# Patient Record
Sex: Male | Born: 1940
Health system: Southern US, Community
[De-identification: ages and names within clinical notes are randomized; demographics above are authoritative.]

## PROBLEM LIST (undated history)

## (undated) DIAGNOSIS — Z940 Kidney transplant status: Secondary | ICD-10-CM

## (undated) DIAGNOSIS — S0591XA Unspecified injury of right eye and orbit, initial encounter: Secondary | ICD-10-CM

## (undated) DIAGNOSIS — I10 Essential (primary) hypertension: Secondary | ICD-10-CM

## (undated) DIAGNOSIS — N189 Chronic kidney disease, unspecified: Secondary | ICD-10-CM

## (undated) DIAGNOSIS — K219 Gastro-esophageal reflux disease without esophagitis: Secondary | ICD-10-CM

## (undated) DIAGNOSIS — M109 Gout, unspecified: Secondary | ICD-10-CM

## (undated) HISTORY — PX: CHOLECYSTECTOMY: SHX55

## (undated) HISTORY — PX: KIDNEY TRANSPLANT: SHX239

---

## 2011-10-05 ENCOUNTER — Emergency Department (HOSPITAL_BASED_OUTPATIENT_CLINIC_OR_DEPARTMENT_OTHER)
Admission: EM | Admit: 2011-10-05 | Discharge: 2011-10-05 | Disposition: A | Discharge status: Home / Self Care | Payer: Medicare Other | Attending: Emergency Medicine | Admitting: Emergency Medicine

## 2011-10-05 ENCOUNTER — Encounter (HOSPITAL_BASED_OUTPATIENT_CLINIC_OR_DEPARTMENT_OTHER): Payer: Self-pay | Admitting: *Deleted

## 2011-10-05 DIAGNOSIS — L97209 Non-pressure chronic ulcer of unspecified calf with unspecified severity: Secondary | ICD-10-CM | POA: Insufficient documentation

## 2011-10-05 DIAGNOSIS — L97909 Non-pressure chronic ulcer of unspecified part of unspecified lower leg with unspecified severity: Secondary | ICD-10-CM

## 2011-10-05 DIAGNOSIS — Z940 Kidney transplant status: Secondary | ICD-10-CM | POA: Insufficient documentation

## 2011-10-05 HISTORY — DX: Kidney transplant status: Z94.0

## 2011-10-05 MED ORDER — TRAMADOL HCL 50 MG PO TABS: 50.0000 mg | ORAL_TABLET | Freq: Four times a day (QID) | ORAL | Status: AC | PRN

## 2011-10-05 NOTE — ED Provider Notes (Signed)
History     CSN: [id number]  Arrival date & time 10/05/11  0900   None     Chief Complaint  Patient presents with  . Wound Infection     HPI 3 month history of "sore " on left lower leg. States he feels like " a bunch of bees stinging him" on wound area.  Patient has an appointment with the wound center in the next 10 days.  Presents today because is having nighttime pain in the area which is interrupting his sleep.  Patient denies any fever or red streaks.  Patient was treating with Neosporin once a day but currently is not treating her taking care of the wound any particular way.  The wounds being left open to air.  Past Medical History  Diagnosis Date  . H/O kidney transplant     Past Surgical History  Procedure Date  . Kidney transplant     History reviewed. No pertinent family history.  History  Substance Use Topics  . Smoking status: Never Smoker   . Smokeless tobacco: Not on file  . Alcohol Use: No      Review of Systems  All other systems reviewed and are negative.    Allergies  Review of patient's allergies indicates no known allergies.  Home Medications   Current Outpatient Rx  Name Route Sig Dispense Refill  . ACETAMINOPHEN 650 MG RE SUPP Rectal Place 650 mg rectally every 4 (four) hours as needed.    . ALLOPURINOL 100 MG PO TABS Oral Take 100 mg by mouth daily.    . ASPIRIN 81 MG PO CHEW Oral Chew 81 mg by mouth daily.    Marland Kitchen MAGNESIUM CHLORIDE 64 MG PO TBEC Oral Take by mouth.    Marland Kitchen MYCOPHENOLATE SODIUM 360 MG PO TBEC Oral Take 360 mg by mouth 2 (two) times daily.    Marland Kitchen RANITIDINE HCL 150 MG PO CAPS Oral Take 150 mg by mouth 2 (two) times daily.    Marland Kitchen SIMVASTATIN 20 MG PO TABS Oral Take 20 mg by mouth every evening.    Marland Kitchen TACROLIMUS 1 MG PO CAPS Oral Take 1 mg by mouth 2 (two) times daily.    . TRAMADOL HCL 50 MG PO TABS Oral Take 1 tablet (50 mg total) by mouth every 6 (six) hours as needed for pain. 30 tablet 0    BP 132/86  Pulse 97  Temp 97.4  F (36.3 C) (Oral)  Resp 14  Ht 5\' 6"  (1.676 m)  Wt 215 lb (97.523 kg)  BMI 34.70 kg/m2  SpO2 99%  Physical Exam  Nursing note and vitals reviewed. Constitutional: He is oriented to person, place, and time. He appears well-developed and well-nourished. No distress.  HENT:  Head: Normocephalic and atraumatic.  Eyes: Pupils are equal, round, and reactive to light.  Neck: Normal range of motion.  Cardiovascular: Normal rate and intact distal pulses.   Pulmonary/Chest: No respiratory distress.  Abdominal: Normal appearance. He exhibits no distension.  Musculoskeletal: Normal range of motion.       Legs: Neurological: He is alert and oriented to person, place, and time. No cranial nerve deficit.  Skin: Skin is warm and dry. No rash noted.  Psychiatric: He has a normal mood and affect. His behavior is normal.    ED Course  Procedures (including critical care time)  Labs Reviewed - No data to display No results found.   1. Leg ulcer       MDM  Patient currently  has an appointment upcoming next week with wound care center.  He is encouraged keep that appointment.  He is instructed to return to emergency department should he develop red streaks or fever/chills.      myof  Dot Lanes, MD 10/06/11 [phone]

## 2011-10-05 NOTE — ED Notes (Signed)
3 month history of "sore " on left lower leg. States he feels like " a bunch of bees stinging him" on wound area.

## 2012-12-22 ENCOUNTER — Ambulatory Visit: Payer: Medicare Other | Attending: Internal Medicine | Admitting: Rehabilitation

## 2012-12-22 DIAGNOSIS — M545 Low back pain, unspecified: Secondary | ICD-10-CM | POA: Insufficient documentation

## 2012-12-22 DIAGNOSIS — IMO0001 Reserved for inherently not codable concepts without codable children: Secondary | ICD-10-CM | POA: Insufficient documentation

## 2012-12-24 ENCOUNTER — Ambulatory Visit: Payer: Medicare Other | Admitting: Rehabilitation

## 2012-12-30 ENCOUNTER — Ambulatory Visit: Payer: Medicare Other | Admitting: Rehabilitation

## 2013-01-06 ENCOUNTER — Ambulatory Visit: Payer: Medicare Other | Attending: Internal Medicine | Admitting: Rehabilitation

## 2013-01-06 DIAGNOSIS — IMO0001 Reserved for inherently not codable concepts without codable children: Secondary | ICD-10-CM | POA: Insufficient documentation

## 2013-01-06 DIAGNOSIS — M545 Low back pain, unspecified: Secondary | ICD-10-CM | POA: Insufficient documentation

## 2013-05-18 ENCOUNTER — Inpatient Hospital Stay (HOSPITAL_BASED_OUTPATIENT_CLINIC_OR_DEPARTMENT_OTHER)
Admission: EM | Admit: 2013-05-18 | Discharge: 2013-05-21 | DRG: 124 | Disposition: A | Discharge status: Home / Self Care | Payer: Medicare Other | Attending: General Surgery | Admitting: General Surgery

## 2013-05-18 ENCOUNTER — Encounter (HOSPITAL_BASED_OUTPATIENT_CLINIC_OR_DEPARTMENT_OTHER): Payer: Self-pay | Admitting: Emergency Medicine

## 2013-05-18 ENCOUNTER — Emergency Department (HOSPITAL_BASED_OUTPATIENT_CLINIC_OR_DEPARTMENT_OTHER): Payer: Medicare Other

## 2013-05-18 DIAGNOSIS — H05231 Hemorrhage of right orbit: Secondary | ICD-10-CM

## 2013-05-18 DIAGNOSIS — F1010 Alcohol abuse, uncomplicated: Secondary | ICD-10-CM | POA: Diagnosis present

## 2013-05-18 DIAGNOSIS — Z940 Kidney transplant status: Secondary | ICD-10-CM

## 2013-05-18 DIAGNOSIS — S065X9A Traumatic subdural hemorrhage with loss of consciousness of unspecified duration, initial encounter: Secondary | ICD-10-CM | POA: Diagnosis present

## 2013-05-18 DIAGNOSIS — S065XAA Traumatic subdural hemorrhage with loss of consciousness status unknown, initial encounter: Secondary | ICD-10-CM | POA: Diagnosis present

## 2013-05-18 DIAGNOSIS — S0010XA Contusion of unspecified eyelid and periocular area, initial encounter: Principal | ICD-10-CM | POA: Diagnosis present

## 2013-05-18 DIAGNOSIS — H1130 Conjunctival hemorrhage, unspecified eye: Secondary | ICD-10-CM | POA: Diagnosis present

## 2013-05-18 DIAGNOSIS — IMO0002 Reserved for concepts with insufficient information to code with codable children: Secondary | ICD-10-CM

## 2013-05-18 DIAGNOSIS — F10929 Alcohol use, unspecified with intoxication, unspecified: Secondary | ICD-10-CM | POA: Diagnosis present

## 2013-05-18 DIAGNOSIS — H0520 Unspecified exophthalmos: Secondary | ICD-10-CM | POA: Diagnosis present

## 2013-05-18 DIAGNOSIS — W19XXXA Unspecified fall, initial encounter: Secondary | ICD-10-CM | POA: Diagnosis present

## 2013-05-18 DIAGNOSIS — N189 Chronic kidney disease, unspecified: Secondary | ICD-10-CM | POA: Diagnosis present

## 2013-05-18 DIAGNOSIS — H1131 Conjunctival hemorrhage, right eye: Secondary | ICD-10-CM | POA: Diagnosis present

## 2013-05-18 DIAGNOSIS — E785 Hyperlipidemia, unspecified: Secondary | ICD-10-CM | POA: Diagnosis present

## 2013-05-18 DIAGNOSIS — Z7982 Long term (current) use of aspirin: Secondary | ICD-10-CM

## 2013-05-18 DIAGNOSIS — K219 Gastro-esophageal reflux disease without esophagitis: Secondary | ICD-10-CM | POA: Diagnosis present

## 2013-05-18 DIAGNOSIS — M109 Gout, unspecified: Secondary | ICD-10-CM | POA: Diagnosis present

## 2013-05-18 DIAGNOSIS — I129 Hypertensive chronic kidney disease with stage 1 through stage 4 chronic kidney disease, or unspecified chronic kidney disease: Secondary | ICD-10-CM | POA: Diagnosis present

## 2013-05-18 DIAGNOSIS — S0510XA Contusion of eyeball and orbital tissues, unspecified eye, initial encounter: Secondary | ICD-10-CM | POA: Diagnosis present

## 2013-05-18 DIAGNOSIS — S0591XA Unspecified injury of right eye and orbit, initial encounter: Secondary | ICD-10-CM

## 2013-05-18 HISTORY — DX: Essential (primary) hypertension: I10

## 2013-05-18 HISTORY — DX: Chronic kidney disease, unspecified: N18.9

## 2013-05-18 HISTORY — DX: Unspecified injury of right eye and orbit, initial encounter: S05.91XA

## 2013-05-18 HISTORY — DX: Gastro-esophageal reflux disease without esophagitis: K21.9

## 2013-05-18 HISTORY — DX: Gout, unspecified: M10.9

## 2013-05-18 LAB — CBC WITH DIFFERENTIAL/PLATELET
Basophils Absolute: 0 K/uL (ref 0.0–0.1)
Basophils Relative: 0 % (ref 0–1)
Eosinophils Absolute: 0 K/uL (ref 0.0–0.7)
Eosinophils Relative: 0 % (ref 0–5)
HCT: 43.8 % (ref 39.0–52.0)
Hemoglobin: 15.4 g/dL (ref 13.0–17.0)
Lymphocytes Relative: 6 % — ABNORMAL LOW (ref 12–46)
Lymphs Abs: 0.4 K/uL — ABNORMAL LOW (ref 0.7–4.0)
MCH: 30.5 pg (ref 26.0–34.0)
MCHC: 35.2 g/dL (ref 30.0–36.0)
MCV: 86.7 fL (ref 78.0–100.0)
Monocytes Absolute: 0.3 K/uL (ref 0.1–1.0)
Monocytes Relative: 4 % (ref 3–12)
Neutro Abs: 5.8 K/uL (ref 1.7–7.7)
Neutrophils Relative %: 90 % — ABNORMAL HIGH (ref 43–77)
Platelets: 208 K/uL (ref 150–400)
RBC: 5.05 MIL/uL (ref 4.22–5.81)
RDW: 13.2 % (ref 11.5–15.5)
WBC: 6.5 K/uL (ref 4.0–10.5)

## 2013-05-18 LAB — BASIC METABOLIC PANEL
BUN: 15 mg/dL (ref 6–23)
CALCIUM: 10 mg/dL (ref 8.4–10.5)
CO2: 24 mEq/L (ref 19–32)
Chloride: 96 mEq/L (ref 96–112)
Creatinine, Ser: 1.3 mg/dL (ref 0.50–1.35)
GFR calc Af Amer: 62 mL/min — ABNORMAL LOW (ref >90)
GFR, EST NON AFRICAN AMERICAN: 54 mL/min — AB (ref >90)
Glucose, Bld: 120 mg/dL — ABNORMAL HIGH (ref 70–99)
Potassium: 3.2 mEq/L — ABNORMAL LOW (ref 3.7–5.3)
SODIUM: 140 meq/L (ref 137–147)

## 2013-05-18 LAB — ETHANOL: Alcohol, Ethyl (B): 175 mg/dL — ABNORMAL HIGH (ref 0–11)

## 2013-05-18 MED ORDER — METHYLPREDNISOLONE SODIUM SUCC 125 MG IJ SOLR
125.0000 mg | Freq: Once | INTRAMUSCULAR | Status: AC
  Administered 2013-05-18: 125 mg via INTRAVENOUS
  Filled 2013-05-18: qty 2

## 2013-05-18 MED ORDER — CEFAZOLIN SODIUM 1-5 GM-% IV SOLN
1.0000 g | Freq: Once | INTRAVENOUS | Status: AC
  Administered 2013-05-18: 1 g via INTRAVENOUS
  Filled 2013-05-18: qty 50

## 2013-05-18 MED ORDER — SODIUM CHLORIDE 0.9 % IV SOLN
Freq: Once | INTRAVENOUS | Status: AC
  Administered 2013-05-18: 23:00:00 via INTRAVENOUS

## 2013-05-18 MED ORDER — TETRACAINE HCL 0.5 % OP SOLN
OPHTHALMIC | Status: AC
  Administered 2013-05-18: 2 [drp]
  Filled 2013-05-18: qty 2

## 2013-05-18 NOTE — ED Provider Notes (Addendum)
CSN: [id number]     Arrival date & time 05/18/13  1953 History   First MD Initiated Contact with Patient 05/18/13 2005     Chief Complaint  Patient presents with  . Fall  . Eye Injury     (Consider location/radiation/quality/duration/timing/severity/associated sxs/prior Treatment) HPI Comments: Patient presents with swelling to his right eye. He states he been drinking some alcohol tonight and lost his balance and fell forward. He doesn't know what he hit but when he got up he had marked swelling around his right eye. He's unable to open his eye. He denies any significant headache. He denies any other injuries from the fall. He's not on anticoagulants. He states his tetanus shot is up-to-date. He denies any chest pain shortness of breath palpitations or other things leading to the fall.  Patient is a 73 y.o. male presenting with fall and eye injury.  Fall Pertinent negatives include no chest pain, no abdominal pain, no headaches and no shortness of breath.  Eye Injury Pertinent negatives include no chest pain, no abdominal pain, no headaches and no shortness of breath.    Past Medical History  Diagnosis Date  . H/O kidney transplant   . Hypertension    Past Surgical History  Procedure Laterality Date  . Kidney transplant     No family history on file. History  Substance Use Topics  . Smoking status: Never Smoker   . Smokeless tobacco: Not on file  . Alcohol Use: Yes    Review of Systems  Constitutional: Negative for fever, chills, diaphoresis and fatigue.  HENT: Positive for facial swelling. Negative for congestion, rhinorrhea and sneezing.   Eyes: Positive for pain, redness and visual disturbance.  Respiratory: Negative for cough, chest tightness and shortness of breath.   Cardiovascular: Negative for chest pain and leg swelling.  Gastrointestinal: Negative for nausea, vomiting, abdominal pain, diarrhea and blood in stool.  Genitourinary: Negative for frequency,  hematuria, flank pain and difficulty urinating.  Musculoskeletal: Negative for arthralgias and back pain.  Skin: Negative for rash.  Neurological: Negative for dizziness, speech difficulty, weakness, numbness and headaches.      Allergies  Review of patient's allergies indicates no known allergies.  Home Medications   Current Outpatient Rx  Name  Route  Sig  Dispense  Refill  . acetaminophen (TYLENOL) 650 MG suppository   Rectal   Place 650 mg rectally every 4 (four) hours as needed.         Marland Kitchen allopurinol (ZYLOPRIM) 100 MG tablet   Oral   Take 100 mg by mouth daily.         Marland Kitchen aspirin 81 MG chewable tablet   Oral   Chew 81 mg by mouth daily.         . magnesium chloride (SLOW-MAG) 64 MG TBEC   Oral   Take by mouth.         . mycophenolate (MYFORTIC) 360 MG TBEC   Oral   Take 360 mg by mouth 2 (two) times daily.         . ranitidine (ZANTAC) 150 MG capsule   Oral   Take 150 mg by mouth 2 (two) times daily.         . simvastatin (ZOCOR) 20 MG tablet   Oral   Take 20 mg by mouth every evening.         . tacrolimus (PROGRAF) 1 MG capsule   Oral   Take 1 mg by mouth 2 (two) times daily.  BP 164/100  Pulse 106  Temp(Src) 98.5 F (36.9 C) (Oral)  Resp 20  Ht 5\' 7"  (1.702 m)  Wt 200 lb (90.719 kg)  BMI 31.32 kg/m2  SpO2 99% Physical Exam  Constitutional: He is oriented to person, place, and time. He appears well-developed and well-nourished.  HENT:  Head: Normocephalic.  The patient has marked. Orbital swelling to the right eye. His eyelids are completely closed due to swelling. He has a laceration 2 cm to the lower eyelid at the lateral aspect. He has limited range of motion of the eye on lateral and superior views. He does state that his vision is a little blurry but he can see fingers and identify the number of fingers in size but is open. He does have tenderness on palpation of the right maxilla. There's also some swelling to lower lip  but no other lacerations are noted.  Eyes: Pupils are equal, round, and reactive to light.  Normal direct and consensual pupillary response.  Eye pressure in right eye 26 by [place]  Neck: Normal range of motion. Neck supple.  No pain along the spine however the patient does smell of EtOH so his neck cannot be cleared by Nexus criteria.  Cardiovascular: Normal rate, regular rhythm and normal heart sounds.   Pulmonary/Chest: Effort normal and breath sounds normal. No respiratory distress. He has no wheezes. He has no rales. He exhibits no tenderness.  No signs of external trauma to the chest or abdomen  Abdominal: Soft. Bowel sounds are normal. There is no tenderness. There is no rebound and no guarding.  Musculoskeletal: Normal range of motion. He exhibits no edema.  No pain on palpation or range of motion extremities  Lymphadenopathy:    He has no cervical adenopathy.  Neurological: He is alert and oriented to person, place, and time.  Skin: Skin is warm and dry. No rash noted.  Psychiatric: He has a normal mood and affect.    ED Course  Procedures (including critical care time) Labs Review Labs Reviewed  CBC WITH DIFFERENTIAL  BASIC METABOLIC PANEL  ETHANOL   Imaging Review Ct Head Wo Contrast  05/18/2013   CLINICAL DATA:  Fall, swelling to right eye.  EXAM: CT HEAD WITHOUT CONTRAST  CT MAXILLOFACIAL WITHOUT CONTRAST  CT CERVICAL SPINE WITHOUT CONTRAST  TECHNIQUE: Multidetector CT imaging of the head, cervical spine, and maxillofacial structures were performed using the standard protocol without intravenous contrast. Multiplanar CT image reconstructions of the cervical spine and maxillofacial structures were also generated.  COMPARISON:  None.  FINDINGS: CT HEAD FINDINGS  More soft tissue swelling over the right orbit. See facial CT report below.  There is diffuse cerebral atrophy and chronic microvascular changes. Old bilateral basal ganglia and thalamic lacunar infarcts.  There are  subtle areas of hyperdensity noted along the falx anteriorly concerning for possible small subdural hemorrhage. No intraparenchymal hemorrhage. No mass effect or midline shift. No hydrocephalus.  No acute calvarial abnormality.  CT MAXILLOFACIAL FINDINGS  Marked soft tissue swelling over the right face and orbit. The stranding/hematoma over the right orbit extends into the right orbit in the lateral extraconal space. This causes proptosis of the right globe and herniation of orbital fat anteriorly. The globe appears intact. No intraconal blood noted behind the right globe. No orbital wall fracture. Zygomatic arches and mandible are intact. Paranasal sinuses are clear.  CT CERVICAL SPINE FINDINGS  Advanced degenerative disc disease changes at C2-3 and C3-4 with near complete disc space loss, endplate spurring  and endplate cyst formation. 3 mm of retrolisthesis of C3 on C4. There is slight subluxation noted along the left C3-4 facet joint. Prevertebral soft tissues are prominent, but this appears to be related to tortuosity of the right carotid artery which passes medially into the prevertebral space. No fracture is visualized. No epidural or paraspinal hematoma.  IMPRESSION: Subtle small areas of hyperdensity along the anterior falx concerning for small subdural hematoma in the midline. No mass effect midline shift. No intraparenchymal hemorrhage.  Large soft tissue hematoma overlying the right orbit and face. The hematoma extends posteriorly into the extra Conal space laterally within the right orbit causing proptosis in herniation of orbital fat.  No facial fracture.  Advanced degenerative disc disease changes at C2-3 and C3-4. There is slight retrolisthesis of C3 on C4 and subluxation at the left facet joint. I suspect this is degenerative related although it is hard to completely exclude soft tissue/ligamentous injury. May consider further evaluation with flexion and extension views or MRI if there is high  clinical suspicion.  Critical Value/emergent results were called by telephone at the time of interpretation on 05/18/2013 at 9:16 PM to Dr. Threasa Beards Jamesina Gaugh , who verbally acknowledged these results.   Electronically Signed   By: Rolm Baptise M.D.   On: 05/18/2013 21:18   Ct Cervical Spine Wo Contrast  05/18/2013   CLINICAL DATA:  Fall, swelling to right eye.  EXAM: CT HEAD WITHOUT CONTRAST  CT MAXILLOFACIAL WITHOUT CONTRAST  CT CERVICAL SPINE WITHOUT CONTRAST  TECHNIQUE: Multidetector CT imaging of the head, cervical spine, and maxillofacial structures were performed using the standard protocol without intravenous contrast. Multiplanar CT image reconstructions of the cervical spine and maxillofacial structures were also generated.  COMPARISON:  None.  FINDINGS: CT HEAD FINDINGS  More soft tissue swelling over the right orbit. See facial CT report below.  There is diffuse cerebral atrophy and chronic microvascular changes. Old bilateral basal ganglia and thalamic lacunar infarcts.  There are subtle areas of hyperdensity noted along the falx anteriorly concerning for possible small subdural hemorrhage. No intraparenchymal hemorrhage. No mass effect or midline shift. No hydrocephalus.  No acute calvarial abnormality.  CT MAXILLOFACIAL FINDINGS  Marked soft tissue swelling over the right face and orbit. The stranding/hematoma over the right orbit extends into the right orbit in the lateral extraconal space. This causes proptosis of the right globe and herniation of orbital fat anteriorly. The globe appears intact. No intraconal blood noted behind the right globe. No orbital wall fracture. Zygomatic arches and mandible are intact. Paranasal sinuses are clear.  CT CERVICAL SPINE FINDINGS  Advanced degenerative disc disease changes at C2-3 and C3-4 with near complete disc space loss, endplate spurring and endplate cyst formation. 3 mm of retrolisthesis of C3 on C4. There is slight subluxation noted along the left C3-4  facet joint. Prevertebral soft tissues are prominent, but this appears to be related to tortuosity of the right carotid artery which passes medially into the prevertebral space. No fracture is visualized. No epidural or paraspinal hematoma.  IMPRESSION: Subtle small areas of hyperdensity along the anterior falx concerning for small subdural hematoma in the midline. No mass effect midline shift. No intraparenchymal hemorrhage.  Large soft tissue hematoma overlying the right orbit and face. The hematoma extends posteriorly into the extra Conal space laterally within the right orbit causing proptosis in herniation of orbital fat.  No facial fracture.  Advanced degenerative disc disease changes at C2-3 and C3-4. There is slight retrolisthesis of C3  on C4 and subluxation at the left facet joint. I suspect this is degenerative related although it is hard to completely exclude soft tissue/ligamentous injury. May consider further evaluation with flexion and extension views or MRI if there is high clinical suspicion.  Critical Value/emergent results were called by telephone at the time of interpretation on 05/18/2013 at 9:16 PM to Dr. Threasa Beards Herminia Warren , who verbally acknowledged these results.   Electronically Signed   By: Rolm Baptise M.D.   On: 05/18/2013 21:18   Ct Maxillofacial Wo Cm  05/18/2013   CLINICAL DATA:  Fall, swelling to right eye.  EXAM: CT HEAD WITHOUT CONTRAST  CT MAXILLOFACIAL WITHOUT CONTRAST  CT CERVICAL SPINE WITHOUT CONTRAST  TECHNIQUE: Multidetector CT imaging of the head, cervical spine, and maxillofacial structures were performed using the standard protocol without intravenous contrast. Multiplanar CT image reconstructions of the cervical spine and maxillofacial structures were also generated.  COMPARISON:  None.  FINDINGS: CT HEAD FINDINGS  More soft tissue swelling over the right orbit. See facial CT report below.  There is diffuse cerebral atrophy and chronic microvascular changes. Old bilateral  basal ganglia and thalamic lacunar infarcts.  There are subtle areas of hyperdensity noted along the falx anteriorly concerning for possible small subdural hemorrhage. No intraparenchymal hemorrhage. No mass effect or midline shift. No hydrocephalus.  No acute calvarial abnormality.  CT MAXILLOFACIAL FINDINGS  Marked soft tissue swelling over the right face and orbit. The stranding/hematoma over the right orbit extends into the right orbit in the lateral extraconal space. This causes proptosis of the right globe and herniation of orbital fat anteriorly. The globe appears intact. No intraconal blood noted behind the right globe. No orbital wall fracture. Zygomatic arches and mandible are intact. Paranasal sinuses are clear.  CT CERVICAL SPINE FINDINGS  Advanced degenerative disc disease changes at C2-3 and C3-4 with near complete disc space loss, endplate spurring and endplate cyst formation. 3 mm of retrolisthesis of C3 on C4. There is slight subluxation noted along the left C3-4 facet joint. Prevertebral soft tissues are prominent, but this appears to be related to tortuosity of the right carotid artery which passes medially into the prevertebral space. No fracture is visualized. No epidural or paraspinal hematoma.  IMPRESSION: Subtle small areas of hyperdensity along the anterior falx concerning for small subdural hematoma in the midline. No mass effect midline shift. No intraparenchymal hemorrhage.  Large soft tissue hematoma overlying the right orbit and face. The hematoma extends posteriorly into the extra Conal space laterally within the right orbit causing proptosis in herniation of orbital fat.  No facial fracture.  Advanced degenerative disc disease changes at C2-3 and C3-4. There is slight retrolisthesis of C3 on C4 and subluxation at the left facet joint. I suspect this is degenerative related although it is hard to completely exclude soft tissue/ligamentous injury. May consider further evaluation with  flexion and extension views or MRI if there is high clinical suspicion.  Critical Value/emergent results were called by telephone at the time of interpretation on 05/18/2013 at 9:16 PM to Dr. Threasa Beards Kannan Proia , who verbally acknowledged these results.   Electronically Signed   By: Rolm Baptise M.D.   On: 05/18/2013 21:18    EKG Interpretation   None       MDM   Final diagnoses:  None    Patient presents with facial trauma after sustaining a fall. He has a large periorbital hematoma extending along the lateral lobe causing proptosis. There's also a question of  a subdural hematoma. There is a question of a cervical injury (retrolithesis of C3 on C4) being old vs new per the radiologist,  however patient denies any neck pain.  Flex/ex films do not show any ligamentous instability.  21:20 paged ENT, but they are not on for facial trauma  22:00 while awaiting call from the facial trauma consultant and neurosurgery, pt and his wife state that they do not want to go to [facility], they want to go to [facility].  I advised them that [facility] is a designated trauma center and might be better prepared to handle pt's injuries, but they want go to [facility].  Awaiting consults from [facility].  22:25 spoke with ENT from [facility] who does not manage facial trauma.  22:36 spoke with Dr. Frederico Hamman with opthamology who recommends steroids, abx.  I advised him of CT findings and current eye pressures.  At this point, he did not feel that further intervention is required. He is covering for both [facility] and [facility], does not feel that his traumatic eye injuries would be managed at [facility].  Spoke with family again who is agreeing to go to [facility].  Awaiting neurosurgery consult.  23:19 spoke with Dr Ellene Route with neurosurgery who will see pt at [facility].  Discussed with Dr. Hulen Skains who will accept pt to [facility].  At this point, I do not feel that I can repair the laceration under the eye due to the large amount of edema.    Malvin Johns,  MD 05/18/13 [id number]  Malvin Johns, MD 05/18/13 [id number]  Malvin Johns, MD 05/18/13 [phone]

## 2013-05-18 NOTE — ED Notes (Signed)
Tripped and fell. Hematoma to his right eye. Unable to open the eye. Large amt of swelling, bruising and blood oozing from unknown site. No LOC.

## 2013-05-19 ENCOUNTER — Encounter (HOSPITAL_COMMUNITY): Payer: Self-pay | Admitting: General Practice

## 2013-05-19 ENCOUNTER — Inpatient Hospital Stay (HOSPITAL_COMMUNITY): Payer: Medicare Other

## 2013-05-19 DIAGNOSIS — S060XAA Concussion with loss of consciousness status unknown, initial encounter: Secondary | ICD-10-CM

## 2013-05-19 DIAGNOSIS — S060X9A Concussion with loss of consciousness of unspecified duration, initial encounter: Secondary | ICD-10-CM

## 2013-05-19 DIAGNOSIS — S0510XA Contusion of eyeball and orbital tissues, unspecified eye, initial encounter: Secondary | ICD-10-CM

## 2013-05-19 LAB — BASIC METABOLIC PANEL
BUN: 15 mg/dL (ref 6–23)
CALCIUM: 9.3 mg/dL (ref 8.4–10.5)
CO2: 24 mEq/L (ref 19–32)
CREATININE: 1.15 mg/dL (ref 0.50–1.35)
Chloride: 94 mEq/L — ABNORMAL LOW (ref 96–112)
GFR calc non Af Amer: 62 mL/min — ABNORMAL LOW (ref >90)
GFR, EST AFRICAN AMERICAN: 72 mL/min — AB (ref >90)
Glucose, Bld: 158 mg/dL — ABNORMAL HIGH (ref 70–99)
Potassium: 3.5 mEq/L — ABNORMAL LOW (ref 3.7–5.3)
Sodium: 138 mEq/L (ref 137–147)

## 2013-05-19 LAB — CBC
HEMATOCRIT: 40.2 % (ref 39.0–52.0)
Hemoglobin: 14.6 g/dL (ref 13.0–17.0)
MCH: 31.5 pg (ref 26.0–34.0)
MCHC: 36.3 g/dL — ABNORMAL HIGH (ref 30.0–36.0)
MCV: 86.6 fL (ref 78.0–100.0)
Platelets: 247 10*3/uL (ref 150–400)
RBC: 4.64 MIL/uL (ref 4.22–5.81)
RDW: 13.7 % (ref 11.5–15.5)
WBC: 11.2 10*3/uL — ABNORMAL HIGH (ref 4.0–10.5)

## 2013-05-19 MED ORDER — DIPHENHYDRAMINE HCL 25 MG PO CAPS
25.0000 mg | ORAL_CAPSULE | Freq: Once | ORAL | Status: AC
  Administered 2013-05-19: 25 mg via ORAL
  Filled 2013-05-19: qty 1

## 2013-05-19 MED ORDER — SODIUM CHLORIDE 0.9 % IV SOLN: INTRAVENOUS | Status: DC

## 2013-05-19 MED ORDER — ONDANSETRON HCL 4 MG/2ML IJ SOLN
4.0000 mg | Freq: Once | INTRAMUSCULAR | Status: AC
  Administered 2013-05-19: 4 mg via INTRAVENOUS

## 2013-05-19 MED ORDER — TACROLIMUS 1 MG PO CAPS: 3.0000 mg | ORAL_CAPSULE | ORAL | Status: DC

## 2013-05-19 MED ORDER — PANTOPRAZOLE SODIUM 40 MG IV SOLR
40.0000 mg | Freq: Every day | INTRAVENOUS | Status: DC
  Filled 2013-05-19: qty 40

## 2013-05-19 MED ORDER — FAMOTIDINE 20 MG PO TABS
20.0000 mg | ORAL_TABLET | Freq: Two times a day (BID) | ORAL | Status: DC
  Filled 2013-05-19 (×4): qty 1

## 2013-05-19 MED ORDER — ONDANSETRON HCL 4 MG/2ML IJ SOLN: 4.0000 mg | Freq: Four times a day (QID) | INTRAMUSCULAR | Status: DC | PRN

## 2013-05-19 MED ORDER — CALCIUM CARBONATE ANTACID 500 MG PO CHEW
1.0000 | CHEWABLE_TABLET | Freq: Three times a day (TID) | ORAL | Status: DC
  Administered 2013-05-19 – 2013-05-21 (×7): 200 mg via ORAL
  Filled 2013-05-19 (×11): qty 1

## 2013-05-19 MED ORDER — BISACODYL 10 MG RE SUPP
10.0000 mg | Freq: Every day | RECTAL | Status: DC | PRN
Start: 2013-05-19 — End: 2013-05-21

## 2013-05-19 MED ORDER — SIMVASTATIN 20 MG PO TABS
20.0000 mg | ORAL_TABLET | Freq: Every evening | ORAL | Status: DC
  Administered 2013-05-19 – 2013-05-20 (×2): 20 mg via ORAL
  Filled 2013-05-19 (×3): qty 1

## 2013-05-19 MED ORDER — ALLOPURINOL 100 MG PO TABS
100.0000 mg | ORAL_TABLET | Freq: Every day | ORAL | Status: DC
  Administered 2013-05-19 – 2013-05-21 (×3): 100 mg via ORAL
  Filled 2013-05-19 (×3): qty 1

## 2013-05-19 MED ORDER — PANTOPRAZOLE SODIUM 40 MG PO TBEC
40.0000 mg | DELAYED_RELEASE_TABLET | Freq: Every day | ORAL | Status: DC
  Administered 2013-05-19 – 2013-05-21 (×4): 40 mg via ORAL
  Filled 2013-05-19 (×2): qty 1

## 2013-05-19 MED ORDER — DOCUSATE SODIUM 100 MG PO CAPS
100.0000 mg | ORAL_CAPSULE | Freq: Two times a day (BID) | ORAL | Status: DC
  Administered 2013-05-19 – 2013-05-21 (×5): 100 mg via ORAL
  Filled 2013-05-19 (×6): qty 1

## 2013-05-19 MED ORDER — TACROLIMUS 1 MG PO CAPS
1.0000 mg | ORAL_CAPSULE | Freq: Two times a day (BID) | ORAL | Status: DC
  Administered 2013-05-19 (×2): 1 mg via ORAL
  Filled 2013-05-19 (×3): qty 1

## 2013-05-19 MED ORDER — HYDROCODONE-ACETAMINOPHEN 5-325 MG PO TABS
0.5000 | ORAL_TABLET | ORAL | Status: DC | PRN
  Administered 2013-05-20 (×2): 0.5 via ORAL
  Filled 2013-05-19 (×2): qty 1

## 2013-05-19 MED ORDER — ONDANSETRON HCL 4 MG PO TABS
4.0000 mg | ORAL_TABLET | Freq: Four times a day (QID) | ORAL | Status: DC | PRN
Start: 2013-05-19 — End: 2013-05-21

## 2013-05-19 MED ORDER — HYDROMORPHONE HCL PF 1 MG/ML IJ SOLN
0.5000 mg | INTRAMUSCULAR | Status: DC | PRN
  Administered 2013-05-19 (×4): 1 mg via INTRAVENOUS
  Filled 2013-05-19 (×4): qty 1

## 2013-05-19 MED ORDER — TACROLIMUS 1 MG PO CAPS
3.0000 mg | ORAL_CAPSULE | Freq: Every day | ORAL | Status: DC
  Administered 2013-05-19 – 2013-05-20 (×2): 3 mg via ORAL
  Filled 2013-05-19 (×3): qty 3

## 2013-05-19 MED ORDER — FENTANYL CITRATE 0.05 MG/ML IJ SOLN
INTRAMUSCULAR | Status: AC
  Administered 2013-05-19: 50 ug via INTRAVENOUS
  Filled 2013-05-19: qty 2

## 2013-05-19 MED ORDER — FENTANYL CITRATE 0.05 MG/ML IJ SOLN
50.0000 ug | Freq: Once | INTRAMUSCULAR | Status: AC
  Administered 2013-05-19: 50 ug via INTRAVENOUS

## 2013-05-19 MED ORDER — TACROLIMUS 1 MG PO CAPS
3.0000 mg | ORAL_CAPSULE | Freq: Once | ORAL | Status: AC
  Administered 2013-05-19: 3 mg via ORAL
  Filled 2013-05-19: qty 3

## 2013-05-19 MED ORDER — TACROLIMUS 1 MG PO CAPS
4.0000 mg | ORAL_CAPSULE | Freq: Every day | ORAL | Status: DC
  Administered 2013-05-20: 4 mg via ORAL
  Administered 2013-05-21: 1 mg via ORAL
  Filled 2013-05-19 (×2): qty 4

## 2013-05-19 MED ORDER — MYCOPHENOLATE SODIUM 180 MG PO TBEC
360.0000 mg | DELAYED_RELEASE_TABLET | Freq: Two times a day (BID) | ORAL | Status: DC
  Administered 2013-05-19 – 2013-05-21 (×6): 360 mg via ORAL
  Filled 2013-05-19 (×7): qty 2

## 2013-05-19 MED ORDER — ONDANSETRON HCL 4 MG/2ML IJ SOLN
INTRAMUSCULAR | Status: AC
  Filled 2013-05-19: qty 2

## 2013-05-19 NOTE — Clinical Social Work Note (Signed)
Clinical Social Work Department BRIEF PSYCHOSOCIAL ASSESSMENT 05/19/2013  Patient:  [name]     Account Number:  [id number]     Admit date:  05/18/2013  Clinical Social Worker:  Myles Lipps  Date/Time:  05/19/2013 03:23 PM  Referred by:  Physician  Date Referred:  05/19/2013 Referred for  Psychosocial assessment  Substance Abuse   Other Referral:   Interview type:  Patient Other interview type:   No family/friends present at time of CSW assessment    PSYCHOSOCIAL DATA Living Status:  WIFE Admitted from facility:   Level of care:   Primary support name:  Buczkowski,Rebecca  [phone] Primary support relationship to patient:  SPOUSE Degree of support available:   Strong    CURRENT CONCERNS Current Concerns  None Noted   Other Concerns:    SOCIAL WORK ASSESSMENT / PLAN Clinical Social Worker met with patient at bedside to offer support and discuss patient needs at discharge.  Patient states that he was at a friends house celebrating two years of his kidney transplant.  Patient had decided that since the weather was getting bad he should head home - when patient stood up out of the chair he fell forward hitting his face on an iron heater.  Patient thinks that he just lost his balance while standing up.  Patient currently lives at home with his wife and plans to return home with her at discharge.    Clinical Social Worker inquired about current substance use.  Patient states that he has not had a drop of alcohol in 10 years and decided to have a shot of whiskey with his friends to celebrate his transplant.  Patient does not have concerns regarding any drug or alcohol use.  SBIRT complete and no resources given.  CSW signing off.  Please reconsult if further needs arise prior to discharge.   Assessment/plan status:  No Further Intervention Required Other assessment/ plan:   Information/referral to community resources:   Holiday representative offered patient resources,  however patient politely declined and states that his wife and son will provide adequate care at discharge.    PATIENT'S/FAMILY'S RESPONSE TO PLAN OF CARE: Patient alert and oriented x3 sitting up in the bed eating lunch.  Patient states that his wife was unable to visit today due to the weather.  Patient wife runs her own business and is able to work her schedule as needed to assist patient as needed at home.  Patient feels that he will be able to return home at discharge.  Patient understanding of social work role and appreciative for the support.

## 2013-05-19 NOTE — Progress Notes (Signed)
Patient examined and I agree with the assessment and plan  Georganna Skeans, MD, MPH, FACS Pager: [phone]  05/19/2013 11:53 AM

## 2013-05-19 NOTE — Progress Notes (Signed)
LOS: 1 day   Subjective: Pt c/o pain and bleeding from right eye.  Patch in place.  Up in chair washing.  No other complaints of pain anywhere else.  No N/V or dizziness, no double vision or vision loss.    Objective: Vital signs in last 24 hours: Temp:  [97.6 F (36.4 C)-98.5 F (36.9 C)] 98.2 F (36.8 C) (02/17 0537) Pulse Rate:  [93-106] 97 (02/17 0537) Resp:  [16-20] 18 (02/17 0537) BP: (159-179)/(76-102) 172/88 mmHg (02/17 0537) SpO2:  [95 %-99 %] 95 % (02/17 0537) Weight:  [200 lb (90.719 kg)] 200 lb (90.719 kg) (02/16 2001) Last BM Date: 05/18/13  Lab Results:  CBC  Recent Labs  05/18/13 2215  WBC 6.5  HGB 15.4  HCT 43.8  PLT 208   BMET  Recent Labs  05/18/13 2215  NA 140  K 3.2*  CL 96  CO2 24  GLUCOSE 120*  BUN 15  CREATININE 1.30  CALCIUM 10.0    Imaging: Dg Cervical Spine With Flex & Extend  05/18/2013   CLINICAL DATA:  Fall.  Abnormal appearance at C3-4 on CT.  EXAM: CERVICAL SPINE COMPLETE WITH FLEXION AND EXTENSION VIEWS  COMPARISON:  CT HEAD W/O CM dated 05/18/2013  FINDINGS: Advanced degenerative disc disease changes at C2-3 and C3-4. Near complete loss of disc space at C3-4. Endplate sclerosis and spurring. There is 4 mm of retrolisthesis of C3 on C4. This does not change with flexion or extension and is likely degenerative in nature.  IMPRESSION: Mild retrolisthesis at C3-4. No evidence of instability with flexion or extension.   Electronically Signed   By: Rolm Baptise M.D.   On: 05/18/2013 22:38   Ct Head Without Contrast  05/19/2013   CLINICAL DATA:  Fall.  Possible subdural hematoma  EXAM: CT HEAD WITHOUT CONTRAST  TECHNIQUE: Contiguous axial images were obtained from the base of the skull through the vertex without intravenous contrast.  COMPARISON:  CT 05/18/2013  FINDINGS: The slight hyperdensity along the anterior corpus callosum on the left has improved and may be an area of subarachnoid hemorrhage. Small hyperdensity in the right sylvian  fissure has improved and most likely is a small area of subarachnoid hemorrhage. No definite subdural hematoma  Generalized atrophy. Chronic microvascular ischemic change. No acute infarct or mass. No midline shift or fracture. Periorbital hematoma on the right.  IMPRESSION: Improvement in presumed mild subarachnoid hemorrhage. No definite subdural hematoma. No midline shift.   Electronically Signed   By: Franchot Gallo M.D.   On: 05/19/2013 07:51   Ct Head Wo Contrast  05/18/2013   CLINICAL DATA:  Fall, swelling to right eye.  EXAM: CT HEAD WITHOUT CONTRAST  CT MAXILLOFACIAL WITHOUT CONTRAST  CT CERVICAL SPINE WITHOUT CONTRAST  TECHNIQUE: Multidetector CT imaging of the head, cervical spine, and maxillofacial structures were performed using the standard protocol without intravenous contrast. Multiplanar CT image reconstructions of the cervical spine and maxillofacial structures were also generated.  COMPARISON:  None.  FINDINGS: CT HEAD FINDINGS  More soft tissue swelling over the right orbit. See facial CT report below.  There is diffuse cerebral atrophy and chronic microvascular changes. Old bilateral basal ganglia and thalamic lacunar infarcts.  There are subtle areas of hyperdensity noted along the falx anteriorly concerning for possible small subdural hemorrhage. No intraparenchymal hemorrhage. No mass effect or midline shift. No hydrocephalus.  No acute calvarial abnormality.  CT MAXILLOFACIAL FINDINGS  Marked soft tissue swelling over the right face and orbit. The stranding/hematoma  over the right orbit extends into the right orbit in the lateral extraconal space. This causes proptosis of the right globe and herniation of orbital fat anteriorly. The globe appears intact. No intraconal blood noted behind the right globe. No orbital wall fracture. Zygomatic arches and mandible are intact. Paranasal sinuses are clear.  CT CERVICAL SPINE FINDINGS  Advanced degenerative disc disease changes at C2-3 and C3-4  with near complete disc space loss, endplate spurring and endplate cyst formation. 3 mm of retrolisthesis of C3 on C4. There is slight subluxation noted along the left C3-4 facet joint. Prevertebral soft tissues are prominent, but this appears to be related to tortuosity of the right carotid artery which passes medially into the prevertebral space. No fracture is visualized. No epidural or paraspinal hematoma.  IMPRESSION: Subtle small areas of hyperdensity along the anterior falx concerning for small subdural hematoma in the midline. No mass effect midline shift. No intraparenchymal hemorrhage.  Large soft tissue hematoma overlying the right orbit and face. The hematoma extends posteriorly into the extra Conal space laterally within the right orbit causing proptosis in herniation of orbital fat.  No facial fracture.  Advanced degenerative disc disease changes at C2-3 and C3-4. There is slight retrolisthesis of C3 on C4 and subluxation at the left facet joint. I suspect this is degenerative related although it is hard to completely exclude soft tissue/ligamentous injury. May consider further evaluation with flexion and extension views or MRI if there is high clinical suspicion.  Critical Value/emergent results were called by telephone at the time of interpretation on 05/18/2013 at 9:16 PM to Dr. Threasa Beards BELFI , who verbally acknowledged these results.   Electronically Signed   By: Rolm Baptise M.D.   On: 05/18/2013 21:18   Ct Cervical Spine Wo Contrast  05/18/2013   CLINICAL DATA:  Fall, swelling to right eye.  EXAM: CT HEAD WITHOUT CONTRAST  CT MAXILLOFACIAL WITHOUT CONTRAST  CT CERVICAL SPINE WITHOUT CONTRAST  TECHNIQUE: Multidetector CT imaging of the head, cervical spine, and maxillofacial structures were performed using the standard protocol without intravenous contrast. Multiplanar CT image reconstructions of the cervical spine and maxillofacial structures were also generated.  COMPARISON:  None.  FINDINGS:  CT HEAD FINDINGS  More soft tissue swelling over the right orbit. See facial CT report below.  There is diffuse cerebral atrophy and chronic microvascular changes. Old bilateral basal ganglia and thalamic lacunar infarcts.  There are subtle areas of hyperdensity noted along the falx anteriorly concerning for possible small subdural hemorrhage. No intraparenchymal hemorrhage. No mass effect or midline shift. No hydrocephalus.  No acute calvarial abnormality.  CT MAXILLOFACIAL FINDINGS  Marked soft tissue swelling over the right face and orbit. The stranding/hematoma over the right orbit extends into the right orbit in the lateral extraconal space. This causes proptosis of the right globe and herniation of orbital fat anteriorly. The globe appears intact. No intraconal blood noted behind the right globe. No orbital wall fracture. Zygomatic arches and mandible are intact. Paranasal sinuses are clear.  CT CERVICAL SPINE FINDINGS  Advanced degenerative disc disease changes at C2-3 and C3-4 with near complete disc space loss, endplate spurring and endplate cyst formation. 3 mm of retrolisthesis of C3 on C4. There is slight subluxation noted along the left C3-4 facet joint. Prevertebral soft tissues are prominent, but this appears to be related to tortuosity of the right carotid artery which passes medially into the prevertebral space. No fracture is visualized. No epidural or paraspinal hematoma.  IMPRESSION: Subtle  small areas of hyperdensity along the anterior falx concerning for small subdural hematoma in the midline. No mass effect midline shift. No intraparenchymal hemorrhage.  Large soft tissue hematoma overlying the right orbit and face. The hematoma extends posteriorly into the extra Conal space laterally within the right orbit causing proptosis in herniation of orbital fat.  No facial fracture.  Advanced degenerative disc disease changes at C2-3 and C3-4. There is slight retrolisthesis of C3 on C4 and subluxation  at the left facet joint. I suspect this is degenerative related although it is hard to completely exclude soft tissue/ligamentous injury. May consider further evaluation with flexion and extension views or MRI if there is high clinical suspicion.  Critical Value/emergent results were called by telephone at the time of interpretation on 05/18/2013 at 9:16 PM to Dr. Threasa Beards BELFI , who verbally acknowledged these results.   Electronically Signed   By: Rolm Baptise M.D.   On: 05/18/2013 21:18   Ct Maxillofacial Wo Cm  05/18/2013   CLINICAL DATA:  Fall, swelling to right eye.  EXAM: CT HEAD WITHOUT CONTRAST  CT MAXILLOFACIAL WITHOUT CONTRAST  CT CERVICAL SPINE WITHOUT CONTRAST  TECHNIQUE: Multidetector CT imaging of the head, cervical spine, and maxillofacial structures were performed using the standard protocol without intravenous contrast. Multiplanar CT image reconstructions of the cervical spine and maxillofacial structures were also generated.  COMPARISON:  None.  FINDINGS: CT HEAD FINDINGS  More soft tissue swelling over the right orbit. See facial CT report below.  There is diffuse cerebral atrophy and chronic microvascular changes. Old bilateral basal ganglia and thalamic lacunar infarcts.  There are subtle areas of hyperdensity noted along the falx anteriorly concerning for possible small subdural hemorrhage. No intraparenchymal hemorrhage. No mass effect or midline shift. No hydrocephalus.  No acute calvarial abnormality.  CT MAXILLOFACIAL FINDINGS  Marked soft tissue swelling over the right face and orbit. The stranding/hematoma over the right orbit extends into the right orbit in the lateral extraconal space. This causes proptosis of the right globe and herniation of orbital fat anteriorly. The globe appears intact. No intraconal blood noted behind the right globe. No orbital wall fracture. Zygomatic arches and mandible are intact. Paranasal sinuses are clear.  CT CERVICAL SPINE FINDINGS  Advanced  degenerative disc disease changes at C2-3 and C3-4 with near complete disc space loss, endplate spurring and endplate cyst formation. 3 mm of retrolisthesis of C3 on C4. There is slight subluxation noted along the left C3-4 facet joint. Prevertebral soft tissues are prominent, but this appears to be related to tortuosity of the right carotid artery which passes medially into the prevertebral space. No fracture is visualized. No epidural or paraspinal hematoma.  IMPRESSION: Subtle small areas of hyperdensity along the anterior falx concerning for small subdural hematoma in the midline. No mass effect midline shift. No intraparenchymal hemorrhage.  Large soft tissue hematoma overlying the right orbit and face. The hematoma extends posteriorly into the extra Conal space laterally within the right orbit causing proptosis in herniation of orbital fat.  No facial fracture.  Advanced degenerative disc disease changes at C2-3 and C3-4. There is slight retrolisthesis of C3 on C4 and subluxation at the left facet joint. I suspect this is degenerative related although it is hard to completely exclude soft tissue/ligamentous injury. May consider further evaluation with flexion and extension views or MRI if there is high clinical suspicion.  Critical Value/emergent results were called by telephone at the time of interpretation on 05/18/2013 at 9:16 PM to  Dr. Threasa Beards BELFI , who verbally acknowledged these results.   Electronically Signed   By: Rolm Baptise M.D.   On: 05/18/2013 21:18    PE: General: pleasant, WD/WN AA male who is sitting up on the commode washing up HEENT: head is normocephalic, periorbital hematoma, exophthalmus, bulbar conjunctival chemosis, limited EOM due to edema, vision grossly intact, no double vision.  Sclera are injected.  Ears and nose without any masses or lesions.  Mouth is pink and moist Heart: regular, rate, and rhythm.  Normal s1,s2. No obvious murmurs, gallops, or rubs noted.  Palpable  radial and pedal pulses bilaterally Lungs: CTAB, no wheezes, rhonchi, or rales noted.  Respiratory effort nonlabored Abd: soft, NT/ND, +BS, no masses, hernias, or organomegaly MS: all 4 extremities are symmetrical with no cyanosis, clubbing, or edema. Skin: warm and dry with no masses, lesions, or rashes Psych: A&Ox3 with an appropriate affect.   Assessment/Plan: Ground level fall Right ocular trauma with conjunctival hemorrhage and peri-ocular hematoma - pending Dr. Frederico Hamman evaluation from Ophtho, got a dose of 125mg  solumedrol at 2300 Possible SDH - appears improved on CT this am ETOH use VTE - SCD's, Lovenox FEN - pending repeat labs, reg diet, IVF KVO Dispo -- pending ophtho eval and recommendations   Excell Seltzer Pager: [phone] General Trauma PA Pager: [phone]   05/19/2013

## 2013-05-19 NOTE — ED Notes (Addendum)
Report give to [facility] who are currently enroute to transport pt to [facility] room [id number] of which Patrick Jupiter RN was called and given report who stated the bed is a ready bed.

## 2013-05-19 NOTE — H&P (Signed)
[name] is an 73 y.o. male.   Chief Complaint: Right eye pain after ground level fall HPI: The patient was at home, had been drinking alcohol, got up from a sitting position, lost h is balance and fell, h itting his face on an iron stove.  Unknown LOC, went to [facility] where CT head done demonstrating right ocular hematoma with some proptosis and a possible SDH anteriorly along the falx.  He did not fall and hit or hurt anything else.  Past Medical History  Diagnosis Date  . H/O kidney transplant   . Hypertension     Past Surgical History  Procedure Laterality Date  . Kidney transplant      No family history on file. Social History:  reports that he has never smoked. He does not have any smokeless tobacco history on file. He reports that he drinks alcohol. He reports that he does not use illicit drugs.  Allergies: No Known Allergies  Medications Prior to Admission  Medication Sig Dispense Refill  . acetaminophen (TYLENOL) 650 MG suppository Place 650 mg rectally every 4 (four) hours as needed.      Marland Kitchen allopurinol (ZYLOPRIM) 100 MG tablet Take 100 mg by mouth daily.      Marland Kitchen aspirin 81 MG chewable tablet Chew 81 mg by mouth daily.      . magnesium chloride (SLOW-MAG) 64 MG TBEC Take by mouth.      . mycophenolate (MYFORTIC) 360 MG TBEC Take 360 mg by mouth 2 (two) times daily.      . ranitidine (ZANTAC) 150 MG capsule Take 150 mg by mouth 2 (two) times daily.      . simvastatin (ZOCOR) 20 MG tablet Take 20 mg by mouth every evening.      . tacrolimus (PROGRAF) 1 MG capsule Take 1 mg by mouth 2 (two) times daily.        Results for orders placed during the hospital encounter of 05/18/13 (from the past 48 hour(s))  CBC WITH DIFFERENTIAL     Status: Abnormal   Collection Time    05/18/13 10:15 PM      Result Value Ref Range   WBC 6.5  4.0 - 10.5 K/uL   RBC 5.05  4.22 - 5.81 MIL/uL   Hemoglobin 15.4  13.0 - 17.0 g/dL   HCT 43.8  39.0 - 52.0 %   MCV 86.7  78.0 - 100.0 fL   MCH 30.5   26.0 - 34.0 pg   MCHC 35.2  30.0 - 36.0 g/dL   RDW 13.2  11.5 - 15.5 %   Platelets 208  150 - 400 K/uL   Neutrophils Relative % 90 (*) 43 - 77 %   Neutro Abs 5.8  1.7 - 7.7 K/uL   Lymphocytes Relative 6 (*) 12 - 46 %   Lymphs Abs 0.4 (*) 0.7 - 4.0 K/uL   Monocytes Relative 4  3 - 12 %   Monocytes Absolute 0.3  0.1 - 1.0 K/uL   Eosinophils Relative 0  0 - 5 %   Eosinophils Absolute 0.0  0.0 - 0.7 K/uL   Basophils Relative 0  0 - 1 %   Basophils Absolute 0.0  0.0 - 0.1 K/uL  BASIC METABOLIC PANEL     Status: Abnormal   Collection Time    05/18/13 10:15 PM      Result Value Ref Range   Sodium 140  137 - 147 mEq/L   Potassium 3.2 (*) 3.7 - 5.3 mEq/L  Chloride 96  96 - 112 mEq/L   CO2 24  19 - 32 mEq/L   Glucose, Bld 120 (*) 70 - 99 mg/dL   BUN 15  6 - 23 mg/dL   Creatinine, Ser 1.30  0.50 - 1.35 mg/dL   Calcium 10.0  8.4 - 10.5 mg/dL   GFR calc non Af Amer 54 (*) >90 mL/min   GFR calc Af Amer 62 (*) >90 mL/min   Comment: (NOTE)     The eGFR has been calculated using the CKD EPI equation.     This calculation has not been validated in all clinical situations.     eGFR's persistently <90 mL/min signify possible Chronic Kidney     Disease.  ETHANOL     Status: Abnormal   Collection Time    05/18/13 10:15 PM      Result Value Ref Range   Alcohol, Ethyl (B) 175 (*) 0 - 11 mg/dL   Comment:            LOWEST DETECTABLE LIMIT FOR     SERUM ALCOHOL IS 11 mg/dL     FOR MEDICAL PURPOSES ONLY   Dg Cervical Spine With Flex & Extend  05/18/2013   CLINICAL DATA:  Fall.  Abnormal appearance at C3-4 on CT.  EXAM: CERVICAL SPINE COMPLETE WITH FLEXION AND EXTENSION VIEWS  COMPARISON:  CT HEAD W/O CM dated 05/18/2013  FINDINGS: Advanced degenerative disc disease changes at C2-3 and C3-4. Near complete loss of disc space at C3-4. Endplate sclerosis and spurring. There is 4 mm of retrolisthesis of C3 on C4. This does not change with flexion or extension and is likely degenerative in nature.   IMPRESSION: Mild retrolisthesis at C3-4. No evidence of instability with flexion or extension.   Electronically Signed   By: Rolm Baptise M.D.   On: 05/18/2013 22:38   Ct Head Wo Contrast  05/18/2013   CLINICAL DATA:  Fall, swelling to right eye.  EXAM: CT HEAD WITHOUT CONTRAST  CT MAXILLOFACIAL WITHOUT CONTRAST  CT CERVICAL SPINE WITHOUT CONTRAST  TECHNIQUE: Multidetector CT imaging of the head, cervical spine, and maxillofacial structures were performed using the standard protocol without intravenous contrast. Multiplanar CT image reconstructions of the cervical spine and maxillofacial structures were also generated.  COMPARISON:  None.  FINDINGS: CT HEAD FINDINGS  More soft tissue swelling over the right orbit. See facial CT report below.  There is diffuse cerebral atrophy and chronic microvascular changes. Old bilateral basal ganglia and thalamic lacunar infarcts.  There are subtle areas of hyperdensity noted along the falx anteriorly concerning for possible small subdural hemorrhage. No intraparenchymal hemorrhage. No mass effect or midline shift. No hydrocephalus.  No acute calvarial abnormality.  CT MAXILLOFACIAL FINDINGS  Marked soft tissue swelling over the right face and orbit. The stranding/hematoma over the right orbit extends into the right orbit in the lateral extraconal space. This causes proptosis of the right globe and herniation of orbital fat anteriorly. The globe appears intact. No intraconal blood noted behind the right globe. No orbital wall fracture. Zygomatic arches and mandible are intact. Paranasal sinuses are clear.  CT CERVICAL SPINE FINDINGS  Advanced degenerative disc disease changes at C2-3 and C3-4 with near complete disc space loss, endplate spurring and endplate cyst formation. 3 mm of retrolisthesis of C3 on C4. There is slight subluxation noted along the left C3-4 facet joint. Prevertebral soft tissues are prominent, but this appears to be related to tortuosity of the right  carotid artery which  passes medially into the prevertebral space. No fracture is visualized. No epidural or paraspinal hematoma.  IMPRESSION: Subtle small areas of hyperdensity along the anterior falx concerning for small subdural hematoma in the midline. No mass effect midline shift. No intraparenchymal hemorrhage.  Large soft tissue hematoma overlying the right orbit and face. The hematoma extends posteriorly into the extra Conal space laterally within the right orbit causing proptosis in herniation of orbital fat.  No facial fracture.  Advanced degenerative disc disease changes at C2-3 and C3-4. There is slight retrolisthesis of C3 on C4 and subluxation at the left facet joint. I suspect this is degenerative related although it is hard to completely exclude soft tissue/ligamentous injury. May consider further evaluation with flexion and extension views or MRI if there is high clinical suspicion.  Critical Value/emergent results were called by telephone at the time of interpretation on 05/18/2013 at 9:16 PM to Dr. Threasa Beards BELFI , who verbally acknowledged these results.   Electronically Signed   By: Rolm Baptise M.D.   On: 05/18/2013 21:18   Ct Cervical Spine Wo Contrast  05/18/2013   CLINICAL DATA:  Fall, swelling to right eye.  EXAM: CT HEAD WITHOUT CONTRAST  CT MAXILLOFACIAL WITHOUT CONTRAST  CT CERVICAL SPINE WITHOUT CONTRAST  TECHNIQUE: Multidetector CT imaging of the head, cervical spine, and maxillofacial structures were performed using the standard protocol without intravenous contrast. Multiplanar CT image reconstructions of the cervical spine and maxillofacial structures were also generated.  COMPARISON:  None.  FINDINGS: CT HEAD FINDINGS  More soft tissue swelling over the right orbit. See facial CT report below.  There is diffuse cerebral atrophy and chronic microvascular changes. Old bilateral basal ganglia and thalamic lacunar infarcts.  There are subtle areas of hyperdensity noted along the falx  anteriorly concerning for possible small subdural hemorrhage. No intraparenchymal hemorrhage. No mass effect or midline shift. No hydrocephalus.  No acute calvarial abnormality.  CT MAXILLOFACIAL FINDINGS  Marked soft tissue swelling over the right face and orbit. The stranding/hematoma over the right orbit extends into the right orbit in the lateral extraconal space. This causes proptosis of the right globe and herniation of orbital fat anteriorly. The globe appears intact. No intraconal blood noted behind the right globe. No orbital wall fracture. Zygomatic arches and mandible are intact. Paranasal sinuses are clear.  CT CERVICAL SPINE FINDINGS  Advanced degenerative disc disease changes at C2-3 and C3-4 with near complete disc space loss, endplate spurring and endplate cyst formation. 3 mm of retrolisthesis of C3 on C4. There is slight subluxation noted along the left C3-4 facet joint. Prevertebral soft tissues are prominent, but this appears to be related to tortuosity of the right carotid artery which passes medially into the prevertebral space. No fracture is visualized. No epidural or paraspinal hematoma.  IMPRESSION: Subtle small areas of hyperdensity along the anterior falx concerning for small subdural hematoma in the midline. No mass effect midline shift. No intraparenchymal hemorrhage.  Large soft tissue hematoma overlying the right orbit and face. The hematoma extends posteriorly into the extra Conal space laterally within the right orbit causing proptosis in herniation of orbital fat.  No facial fracture.  Advanced degenerative disc disease changes at C2-3 and C3-4. There is slight retrolisthesis of C3 on C4 and subluxation at the left facet joint. I suspect this is degenerative related although it is hard to completely exclude soft tissue/ligamentous injury. May consider further evaluation with flexion and extension views or MRI if there is high clinical suspicion.  Critical Value/emergent results  were called by telephone at the time of interpretation on 05/18/2013 at 9:16 PM to Dr. Threasa Beards BELFI , who verbally acknowledged these results.   Electronically Signed   By: Rolm Baptise M.D.   On: 05/18/2013 21:18   Ct Maxillofacial Wo Cm  05/18/2013   CLINICAL DATA:  Fall, swelling to right eye.  EXAM: CT HEAD WITHOUT CONTRAST  CT MAXILLOFACIAL WITHOUT CONTRAST  CT CERVICAL SPINE WITHOUT CONTRAST  TECHNIQUE: Multidetector CT imaging of the head, cervical spine, and maxillofacial structures were performed using the standard protocol without intravenous contrast. Multiplanar CT image reconstructions of the cervical spine and maxillofacial structures were also generated.  COMPARISON:  None.  FINDINGS: CT HEAD FINDINGS  More soft tissue swelling over the right orbit. See facial CT report below.  There is diffuse cerebral atrophy and chronic microvascular changes. Old bilateral basal ganglia and thalamic lacunar infarcts.  There are subtle areas of hyperdensity noted along the falx anteriorly concerning for possible small subdural hemorrhage. No intraparenchymal hemorrhage. No mass effect or midline shift. No hydrocephalus.  No acute calvarial abnormality.  CT MAXILLOFACIAL FINDINGS  Marked soft tissue swelling over the right face and orbit. The stranding/hematoma over the right orbit extends into the right orbit in the lateral extraconal space. This causes proptosis of the right globe and herniation of orbital fat anteriorly. The globe appears intact. No intraconal blood noted behind the right globe. No orbital wall fracture. Zygomatic arches and mandible are intact. Paranasal sinuses are clear.  CT CERVICAL SPINE FINDINGS  Advanced degenerative disc disease changes at C2-3 and C3-4 with near complete disc space loss, endplate spurring and endplate cyst formation. 3 mm of retrolisthesis of C3 on C4. There is slight subluxation noted along the left C3-4 facet joint. Prevertebral soft tissues are prominent, but this  appears to be related to tortuosity of the right carotid artery which passes medially into the prevertebral space. No fracture is visualized. No epidural or paraspinal hematoma.  IMPRESSION: Subtle small areas of hyperdensity along the anterior falx concerning for small subdural hematoma in the midline. No mass effect midline shift. No intraparenchymal hemorrhage.  Large soft tissue hematoma overlying the right orbit and face. The hematoma extends posteriorly into the extra Conal space laterally within the right orbit causing proptosis in herniation of orbital fat.  No facial fracture.  Advanced degenerative disc disease changes at C2-3 and C3-4. There is slight retrolisthesis of C3 on C4 and subluxation at the left facet joint. I suspect this is degenerative related although it is hard to completely exclude soft tissue/ligamentous injury. May consider further evaluation with flexion and extension views or MRI if there is high clinical suspicion.  Critical Value/emergent results were called by telephone at the time of interpretation on 05/18/2013 at 9:16 PM to Dr. Threasa Beards BELFI , who verbally acknowledged these results.   Electronically Signed   By: Rolm Baptise M.D.   On: 05/18/2013 21:18    ROS  Blood pressure 172/88, pulse 97, temperature 98.2 F (36.8 C), temperature source Oral, resp. rate 18, height 5' 7" (1.702 m), weight 90.719 kg (200 lb), SpO2 95.00%. Physical Exam  Constitutional: He is oriented to person, place, and time. He appears well-developed and well-nourished.  HENT:  Head: Normocephalic and atraumatic.  Eyes: Pupils are equal, round, and reactive to light. Right eye exhibits discharge. No foreign body present in the right eye. Right conjunctiva is injected. Right conjunctiva has a hemorrhage. Right eye exhibits normal extraocular motion.  Right pupil is reactive.    Continued oozing from the conjunctiva  Cardiovascular: Normal rate, regular rhythm and normal heart sounds.     Respiratory: Effort normal and breath sounds normal.  GI: Soft. Bowel sounds are normal. There is no tenderness.    Musculoskeletal: Normal range of motion.  Neurological: He is alert and oriented to person, place, and time. He has normal reflexes.  Skin: Skin is warm and dry.  Psychiatric: He has a normal mood and affect. His behavior is normal. Judgment and thought content normal.     Assessment/Plan Ground level fall Right ocular trauma with conjunctival hemorrhage and peri-ocular hematoma. Possible SDH (Called questionably by the radiologist, but I could not appreciate myself). No facial fractures.  Admit for observation.  Opthalmology was contacted when the patient was at [facility], but did not come to see the patient.  Made recommendations, i.e. Steroids which the patient has already received at least one dose.  Should come to see the patient at [facility]  Heart healthy diet.  Continue all his medications.  Gwenyth Ober 05/19/2013, 6:30 AM

## 2013-05-19 NOTE — Progress Notes (Signed)
UR completed.  Montgomery Favor, RN BSN MHA CCM Trauma/Neuro ICU Case Manager [phone]  

## 2013-05-19 NOTE — Consult Note (Signed)
Reason for Consult: S/P fall c blunt head trauma and ocular inury; (stranding retrobulbar  intraconal hematoma ) Referring Physician: Jawad, [name] is an 73 y.o. male.  HPI: S/P traumatic eye injury od.  Past Medical History  Diagnosis Date  . H/O kidney transplant   . Hypertension   . Chronic kidney disease     HX OF KIDNEY TRANSPLANT 05/2011  . GERD (gastroesophageal reflux disease)   . Gout   . Trauma to eye, right 05/18/2013    Past Surgical History  Procedure Laterality Date  . Kidney transplant    . Cholecystectomy      History reviewed. No pertinent family history.  Social History:  reports that he has never smoked. He has never used smokeless tobacco. He reports that he does not drink alcohol or use illicit drugs.  Allergies: No Known Allergies  Medications: I have reviewed the patient's current medications.  Results for orders placed during the hospital encounter of 05/18/13 (from the past 48 hour(s))  CBC WITH DIFFERENTIAL     Status: Abnormal   Collection Time    05/18/13 10:15 PM      Result Value Ref Range   WBC 6.5  4.0 - 10.5 K/uL   RBC 5.05  4.22 - 5.81 MIL/uL   Hemoglobin 15.4  13.0 - 17.0 g/dL   HCT 43.8  39.0 - 52.0 %   MCV 86.7  78.0 - 100.0 fL   MCH 30.5  26.0 - 34.0 pg   MCHC 35.2  30.0 - 36.0 g/dL   RDW 13.2  11.5 - 15.5 %   Platelets 208  150 - 400 K/uL   Neutrophils Relative % 90 (*) 43 - 77 %   Neutro Abs 5.8  1.7 - 7.7 K/uL   Lymphocytes Relative 6 (*) 12 - 46 %   Lymphs Abs 0.4 (*) 0.7 - 4.0 K/uL   Monocytes Relative 4  3 - 12 %   Monocytes Absolute 0.3  0.1 - 1.0 K/uL   Eosinophils Relative 0  0 - 5 %   Eosinophils Absolute 0.0  0.0 - 0.7 K/uL   Basophils Relative 0  0 - 1 %   Basophils Absolute 0.0  0.0 - 0.1 K/uL  BASIC METABOLIC PANEL     Status: Abnormal   Collection Time    05/18/13 10:15 PM      Result Value Ref Range   Sodium 140  137 - 147 mEq/L   Potassium 3.2 (*) 3.7 - 5.3 mEq/L   Chloride 96  96 - 112 mEq/L   CO2 24  19 - 32 mEq/L   Glucose, Bld 120 (*) 70 - 99 mg/dL   BUN 15  6 - 23 mg/dL   Creatinine, Ser 1.30  0.50 - 1.35 mg/dL   Calcium 10.0  8.4 - 10.5 mg/dL   GFR calc non Af Amer 54 (*) >90 mL/min   GFR calc Af Amer 62 (*) >90 mL/min   Comment: (NOTE)     The eGFR has been calculated using the CKD EPI equation.     This calculation has not been validated in all clinical situations.     eGFR's persistently <90 mL/min signify possible Chronic Kidney     Disease.  ETHANOL     Status: Abnormal   Collection Time    05/18/13 10:15 PM      Result Value Ref Range   Alcohol, Ethyl (B) 175 (*) 0 - 11 mg/dL   Comment:  LOWEST DETECTABLE LIMIT FOR     SERUM ALCOHOL IS 11 mg/dL     FOR MEDICAL PURPOSES ONLY  CBC     Status: Abnormal   Collection Time    05/19/13 10:30 AM      Result Value Ref Range   WBC 11.2 (*) 4.0 - 10.5 K/uL   RBC 4.64  4.22 - 5.81 MIL/uL   Hemoglobin 14.6  13.0 - 17.0 g/dL   HCT 40.2  39.0 - 52.0 %   MCV 86.6  78.0 - 100.0 fL   MCH 31.5  26.0 - 34.0 pg   MCHC 36.3 (*) 30.0 - 36.0 g/dL   RDW 13.7  11.5 - 15.5 %   Platelets 247  150 - 400 K/uL  BASIC METABOLIC PANEL     Status: Abnormal   Collection Time    05/19/13 10:30 AM      Result Value Ref Range   Sodium 138  137 - 147 mEq/L   Potassium 3.5 (*) 3.7 - 5.3 mEq/L   Chloride 94 (*) 96 - 112 mEq/L   CO2 24  19 - 32 mEq/L   Glucose, Bld 158 (*) 70 - 99 mg/dL   BUN 15  6 - 23 mg/dL   Creatinine, Ser 1.15  0.50 - 1.35 mg/dL   Calcium 9.3  8.4 - 10.5 mg/dL   GFR calc non Af Amer 62 (*) >90 mL/min   GFR calc Af Amer 72 (*) >90 mL/min   Comment: (NOTE)     The eGFR has been calculated using the CKD EPI equation.     This calculation has not been validated in all clinical situations.     eGFR's persistently <90 mL/min signify possible Chronic Kidney     Disease.    Dg Cervical Spine With Flex & Extend  05/18/2013   CLINICAL DATA:  Fall.  Abnormal appearance at C3-4 on CT.  EXAM: CERVICAL SPINE  COMPLETE WITH FLEXION AND EXTENSION VIEWS  COMPARISON:  CT HEAD W/O CM dated 05/18/2013  FINDINGS: Advanced degenerative disc disease changes at C2-3 and C3-4. Near complete loss of disc space at C3-4. Endplate sclerosis and spurring. There is 4 mm of retrolisthesis of C3 on C4. This does not change with flexion or extension and is likely degenerative in nature.  IMPRESSION: Mild retrolisthesis at C3-4. No evidence of instability with flexion or extension.   Electronically Signed   By: Rolm Baptise M.D.   On: 05/18/2013 22:38   Ct Head Without Contrast  05/19/2013   CLINICAL DATA:  Fall.  Possible subdural hematoma  EXAM: CT HEAD WITHOUT CONTRAST  TECHNIQUE: Contiguous axial images were obtained from the base of the skull through the vertex without intravenous contrast.  COMPARISON:  CT 05/18/2013  FINDINGS: The slight hyperdensity along the anterior corpus callosum on the left has improved and may be an area of subarachnoid hemorrhage. Small hyperdensity in the right sylvian fissure has improved and most likely is a small area of subarachnoid hemorrhage. No definite subdural hematoma  Generalized atrophy. Chronic microvascular ischemic change. No acute infarct or mass. No midline shift or fracture. Periorbital hematoma on the right.  IMPRESSION: Improvement in presumed mild subarachnoid hemorrhage. No definite subdural hematoma. No midline shift.   Electronically Signed   By: Franchot Gallo M.D.   On: 05/19/2013 07:51   Ct Head Wo Contrast  05/18/2013   CLINICAL DATA:  Fall, swelling to right eye.  EXAM: CT HEAD WITHOUT CONTRAST  CT MAXILLOFACIAL WITHOUT CONTRAST  CT CERVICAL SPINE WITHOUT CONTRAST  TECHNIQUE: Multidetector CT imaging of the head, cervical spine, and maxillofacial structures were performed using the standard protocol without intravenous contrast. Multiplanar CT image reconstructions of the cervical spine and maxillofacial structures were also generated.  COMPARISON:  None.  FINDINGS: CT HEAD  FINDINGS  More soft tissue swelling over the right orbit. See facial CT report below.  There is diffuse cerebral atrophy and chronic microvascular changes. Old bilateral basal ganglia and thalamic lacunar infarcts.  There are subtle areas of hyperdensity noted along the falx anteriorly concerning for possible small subdural hemorrhage. No intraparenchymal hemorrhage. No mass effect or midline shift. No hydrocephalus.  No acute calvarial abnormality.  CT MAXILLOFACIAL FINDINGS  Marked soft tissue swelling over the right face and orbit. The stranding/hematoma over the right orbit extends into the right orbit in the lateral extraconal space. This causes proptosis of the right globe and herniation of orbital fat anteriorly. The globe appears intact. No intraconal blood noted behind the right globe. No orbital wall fracture. Zygomatic arches and mandible are intact. Paranasal sinuses are clear.  CT CERVICAL SPINE FINDINGS  Advanced degenerative disc disease changes at C2-3 and C3-4 with near complete disc space loss, endplate spurring and endplate cyst formation. 3 mm of retrolisthesis of C3 on C4. There is slight subluxation noted along the left C3-4 facet joint. Prevertebral soft tissues are prominent, but this appears to be related to tortuosity of the right carotid artery which passes medially into the prevertebral space. No fracture is visualized. No epidural or paraspinal hematoma.  IMPRESSION: Subtle small areas of hyperdensity along the anterior falx concerning for small subdural hematoma in the midline. No mass effect midline shift. No intraparenchymal hemorrhage.  Large soft tissue hematoma overlying the right orbit and face. The hematoma extends posteriorly into the extra Conal space laterally within the right orbit causing proptosis in herniation of orbital fat.  No facial fracture.  Advanced degenerative disc disease changes at C2-3 and C3-4. There is slight retrolisthesis of C3 on C4 and subluxation at the  left facet joint. I suspect this is degenerative related although it is hard to completely exclude soft tissue/ligamentous injury. May consider further evaluation with flexion and extension views or MRI if there is high clinical suspicion.  Critical Value/emergent results were called by telephone at the time of interpretation on 05/18/2013 at 9:16 PM to Dr. Threasa Beards BELFI , who verbally acknowledged these results.   Electronically Signed   By: Rolm Baptise M.D.   On: 05/18/2013 21:18   Ct Cervical Spine Wo Contrast  05/18/2013   CLINICAL DATA:  Fall, swelling to right eye.  EXAM: CT HEAD WITHOUT CONTRAST  CT MAXILLOFACIAL WITHOUT CONTRAST  CT CERVICAL SPINE WITHOUT CONTRAST  TECHNIQUE: Multidetector CT imaging of the head, cervical spine, and maxillofacial structures were performed using the standard protocol without intravenous contrast. Multiplanar CT image reconstructions of the cervical spine and maxillofacial structures were also generated.  COMPARISON:  None.  FINDINGS: CT HEAD FINDINGS  More soft tissue swelling over the right orbit. See facial CT report below.  There is diffuse cerebral atrophy and chronic microvascular changes. Old bilateral basal ganglia and thalamic lacunar infarcts.  There are subtle areas of hyperdensity noted along the falx anteriorly concerning for possible small subdural hemorrhage. No intraparenchymal hemorrhage. No mass effect or midline shift. No hydrocephalus.  No acute calvarial abnormality.  CT MAXILLOFACIAL FINDINGS  Marked soft tissue swelling over the right face and orbit. The stranding/hematoma over the right  orbit extends into the right orbit in the lateral extraconal space. This causes proptosis of the right globe and herniation of orbital fat anteriorly. The globe appears intact. No intraconal blood noted behind the right globe. No orbital wall fracture. Zygomatic arches and mandible are intact. Paranasal sinuses are clear.  CT CERVICAL SPINE FINDINGS  Advanced  degenerative disc disease changes at C2-3 and C3-4 with near complete disc space loss, endplate spurring and endplate cyst formation. 3 mm of retrolisthesis of C3 on C4. There is slight subluxation noted along the left C3-4 facet joint. Prevertebral soft tissues are prominent, but this appears to be related to tortuosity of the right carotid artery which passes medially into the prevertebral space. No fracture is visualized. No epidural or paraspinal hematoma.  IMPRESSION: Subtle small areas of hyperdensity along the anterior falx concerning for small subdural hematoma in the midline. No mass effect midline shift. No intraparenchymal hemorrhage.  Large soft tissue hematoma overlying the right orbit and face. The hematoma extends posteriorly into the extra Conal space laterally within the right orbit causing proptosis in herniation of orbital fat.  No facial fracture.  Advanced degenerative disc disease changes at C2-3 and C3-4. There is slight retrolisthesis of C3 on C4 and subluxation at the left facet joint. I suspect this is degenerative related although it is hard to completely exclude soft tissue/ligamentous injury. May consider further evaluation with flexion and extension views or MRI if there is high clinical suspicion.  Critical Value/emergent results were called by telephone at the time of interpretation on 05/18/2013 at 9:16 PM to Dr. Threasa Beards BELFI , who verbally acknowledged these results.   Electronically Signed   By: Rolm Baptise M.D.   On: 05/18/2013 21:18   Ct Maxillofacial Wo Cm  05/18/2013   CLINICAL DATA:  Fall, swelling to right eye.  EXAM: CT HEAD WITHOUT CONTRAST  CT MAXILLOFACIAL WITHOUT CONTRAST  CT CERVICAL SPINE WITHOUT CONTRAST  TECHNIQUE: Multidetector CT imaging of the head, cervical spine, and maxillofacial structures were performed using the standard protocol without intravenous contrast. Multiplanar CT image reconstructions of the cervical spine and maxillofacial structures were  also generated.  COMPARISON:  None.  FINDINGS: CT HEAD FINDINGS  More soft tissue swelling over the right orbit. See facial CT report below.  There is diffuse cerebral atrophy and chronic microvascular changes. Old bilateral basal ganglia and thalamic lacunar infarcts.  There are subtle areas of hyperdensity noted along the falx anteriorly concerning for possible small subdural hemorrhage. No intraparenchymal hemorrhage. No mass effect or midline shift. No hydrocephalus.  No acute calvarial abnormality.  CT MAXILLOFACIAL FINDINGS  Marked soft tissue swelling over the right face and orbit. The stranding/hematoma over the right orbit extends into the right orbit in the lateral extraconal space. This causes proptosis of the right globe and herniation of orbital fat anteriorly. The globe appears intact. No intraconal blood noted behind the right globe. No orbital wall fracture. Zygomatic arches and mandible are intact. Paranasal sinuses are clear.  CT CERVICAL SPINE FINDINGS  Advanced degenerative disc disease changes at C2-3 and C3-4 with near complete disc space loss, endplate spurring and endplate cyst formation. 3 mm of retrolisthesis of C3 on C4. There is slight subluxation noted along the left C3-4 facet joint. Prevertebral soft tissues are prominent, but this appears to be related to tortuosity of the right carotid artery which passes medially into the prevertebral space. No fracture is visualized. No epidural or paraspinal hematoma.  IMPRESSION: Subtle small areas of hyperdensity  along the anterior falx concerning for small subdural hematoma in the midline. No mass effect midline shift. No intraparenchymal hemorrhage.  Large soft tissue hematoma overlying the right orbit and face. The hematoma extends posteriorly into the extra Conal space laterally within the right orbit causing proptosis in herniation of orbital fat.  No facial fracture.  Advanced degenerative disc disease changes at C2-3 and C3-4. There is  slight retrolisthesis of C3 on C4 and subluxation at the left facet joint. I suspect this is degenerative related although it is hard to completely exclude soft tissue/ligamentous injury. May consider further evaluation with flexion and extension views or MRI if there is high clinical suspicion.  Critical Value/emergent results were called by telephone at the time of interpretation on 05/18/2013 at 9:16 PM to Dr. Threasa Beards BELFI , who verbally acknowledged these results.   Electronically Signed   By: Rolm Baptise M.D.   On: 05/18/2013 21:18    Review of Systems  Constitutional: Negative for fever, chills and malaise/fatigue.  HENT: Negative for ear discharge, ear pain and nosebleeds.   Eyes: Positive for blurred vision, pain, discharge and redness. Negative for double vision and photophobia.        VA  : OD :   NsRx: 20/70         : OS :   Ns Rx : 20/70 Pupils:  Od:  33m : +2: 0 APD               Os :432m: +2 : 0 APD  Respiratory: Negative.   Cardiovascular:        VA :  OD s Rx : 20/70           OS: sRx : 20/70 Pupils:  : Od:   19m66m2 / 0 APD               : Os :  19mm22m / 0 APD  Musculoskeletal: Negative.   Skin: Negative for rash.  Neurological: Negative.  Negative for headaches.   Blood pressure 156/87, pulse 115, temperature 98.6 F (37 C), temperature source Oral, resp. rate 18, height 5' 7"  (1.702 m), weight 90.719 kg (200 lb), SpO2 98.00%. Physical Exam  Constitutional: He is oriented to person, place, and time.  HENT:  Head: Normocephalic.  R facial malar superficial laceration   Eyes: EOM are normal. Pupils are equal, round, and reactive to light. Right eye exhibits discharge.    +1 Proptosis od c subconjunctival hematoma x 360:   Prolapse of conjunctiva inferiorly; Globe freely mobile 2nd to senile lid laxity   Neck: Normal range of motion.  Neurological: He is alert and oriented to person, place, and time.  Skin: Skin is warm.    Assessment/Plan: Mild proptosis od :    Subconjunctival hematoma: Stranding retrobulbar hematoma on CT scan. Proptosis and senile  lid laxity reduced compartment tension of retroorbital and intraconal pressure. Globe freely mobile .  Vision intact :no duction restriction . Recc:  Topical : Tobradex ointment :   1/4 " strip od  BID .  Oral prednisone : 80 mg x 2days then 40 mg x 3 days then 20 mg x 2days : then  10 mgs x2days.  Keflex : 500 mg :PO BID x 7days.  F[facility]U @ [facility] wk.  Andra Heslin A 05/19/2013, 6:24 PM

## 2013-05-19 NOTE — Plan of Care (Signed)
Problem: Consults Goal: General Medical Patient Education See Patient Education Module for specific education. Outcome: Progressing Neuro surgeon consult pending this am

## 2013-05-19 NOTE — Progress Notes (Signed)
Patient c/o regarding inability to take multiple tums tablets when he wants. Explaination given of scheduled Tums tablets. Patient then states not having Tums when he requests causes his pain level to increase to 9-10.

## 2013-05-20 DIAGNOSIS — Z940 Kidney transplant status: Secondary | ICD-10-CM | POA: Insufficient documentation

## 2013-05-20 DIAGNOSIS — I10 Essential (primary) hypertension: Secondary | ICD-10-CM | POA: Insufficient documentation

## 2013-05-20 DIAGNOSIS — M109 Gout, unspecified: Secondary | ICD-10-CM | POA: Insufficient documentation

## 2013-05-20 DIAGNOSIS — W19XXXA Unspecified fall, initial encounter: Secondary | ICD-10-CM | POA: Diagnosis present

## 2013-05-20 DIAGNOSIS — E785 Hyperlipidemia, unspecified: Secondary | ICD-10-CM | POA: Insufficient documentation

## 2013-05-20 DIAGNOSIS — F10929 Alcohol use, unspecified with intoxication, unspecified: Secondary | ICD-10-CM | POA: Diagnosis present

## 2013-05-20 DIAGNOSIS — K219 Gastro-esophageal reflux disease without esophagitis: Secondary | ICD-10-CM | POA: Insufficient documentation

## 2013-05-20 DIAGNOSIS — S065X9A Traumatic subdural hemorrhage with loss of consciousness of unspecified duration, initial encounter: Secondary | ICD-10-CM | POA: Diagnosis present

## 2013-05-20 DIAGNOSIS — S065XAA Traumatic subdural hemorrhage with loss of consciousness status unknown, initial encounter: Secondary | ICD-10-CM | POA: Diagnosis present

## 2013-05-20 MED ORDER — CEPHALEXIN 500 MG PO CAPS
500.0000 mg | ORAL_CAPSULE | Freq: Two times a day (BID) | ORAL | Status: DC
  Administered 2013-05-20 – 2013-05-21 (×3): 500 mg via ORAL
  Filled 2013-05-20 (×4): qty 1

## 2013-05-20 MED ORDER — TOBRAMYCIN-DEXAMETHASONE 0.3-0.1 % OP OINT
TOPICAL_OINTMENT | Freq: Two times a day (BID) | OPHTHALMIC | Status: DC
  Administered 2013-05-20: 1 via OPHTHALMIC
  Administered 2013-05-20 – 2013-05-21 (×2): via OPHTHALMIC
  Filled 2013-05-20: qty 3.5

## 2013-05-20 MED ORDER — HYDROCODONE-ACETAMINOPHEN 10-325 MG PO TABS
0.5000 | ORAL_TABLET | ORAL | Status: DC | PRN
  Administered 2013-05-20 (×2): 1 via ORAL
  Filled 2013-05-20 (×2): qty 1

## 2013-05-20 MED ORDER — HYDROMORPHONE HCL PF 1 MG/ML IJ SOLN: 0.5000 mg | INTRAMUSCULAR | Status: DC | PRN

## 2013-05-20 MED ORDER — PREDNISONE 50 MG PO TABS
80.0000 mg | ORAL_TABLET | Freq: Every day | ORAL | Status: DC
  Administered 2013-05-20 – 2013-05-21 (×2): 80 mg via ORAL
  Filled 2013-05-20 (×3): qty 1

## 2013-05-20 MED ORDER — SODIUM BICARBONATE 650 MG PO TABS
650.0000 mg | ORAL_TABLET | Freq: Three times a day (TID) | ORAL | Status: DC
  Administered 2013-05-20 – 2013-05-21 (×3): 650 mg via ORAL
  Filled 2013-05-20 (×5): qty 1

## 2013-05-20 MED ORDER — ASPIRIN 81 MG PO CHEW
81.0000 mg | CHEWABLE_TABLET | Freq: Every day | ORAL | Status: DC
  Administered 2013-05-20 – 2013-05-21 (×2): 81 mg via ORAL
  Filled 2013-05-20 (×2): qty 1

## 2013-05-20 MED ORDER — SULFAMETHOXAZOLE-TRIMETHOPRIM 400-80 MG PO TABS
1.0000 | ORAL_TABLET | ORAL | Status: DC
  Administered 2013-05-20: 1 via ORAL
  Filled 2013-05-20 (×2): qty 1

## 2013-05-20 NOTE — Progress Notes (Signed)
Patient ID: [name], male   DOB: 05/30/1940, 73 y.o.   MRN: [id number]   LOS: 2 days   Subjective: Doing ok but a little wobbly when he gets up.   Objective: Vital signs in last 24 hours: Temp:  [98.3 F (36.8 C)-98.6 F (37 C)] 98.4 F (36.9 C) (02/18 0456) Pulse Rate:  [85-115] 85 (02/18 0456) Resp:  [18-20] 18 (02/18 0456) BP: (144-163)/(87-108) 144/96 mmHg (02/18 0615) SpO2:  [92 %-98 %] 92 % (02/18 0456) Last BM Date: 05/18/13   Physical Exam General appearance: alert and no distress Eyes: OD proptotic, chemotic, conjuntival hemorrhage Resp: clear to auscultation bilaterally Cardio: regular rate and rhythm GI: normal findings: bowel sounds normal and soft, non-tender   Assessment/Plan: Ground level fall  TBI w/SDH Right ocular trauma with conjunctival hemorrhage and peri-ocular hematoma - Appreciate ophthalmology evaluation HTN -- Home meds ETOH use  FEN - No issues VTE - SCD's, Lovenox  Dispo -- Will likely d/c this afternoon but will have PT evaluate first    Lisette Abu, PA-C Pager: [phone] General Trauma PA Pager: [phone]   05/20/2013

## 2013-05-20 NOTE — Evaluation (Signed)
Physical Therapy Evaluation Patient Details Name: [name] MRN: [id number] DOB: 27-Mar-1941 Today's Date: 05/20/2013 Time: [id number] PT Time Calculation (min): 27 min  PT Assessment / Plan / Recommendation History of Present Illness  adm s/p fall with Right ocular trauma with conjunctival hemorrhage and peri-ocular hematoma. Pt normally does not consume alcohol and reports he had a shot of whiskey to celebrate (with his friends) 2 yrs since his kidney transplant. Later stood to leave and fell into wood burning stove   Clinical Impression  Pt admitted with above injuries s/p fall. Pt remains unsteady on his feet with incr risk of falling due to his sense of dysequilibrium (?pain medicine related) and vision changes due to eye injury (and does not have his glasses at the hospital). He denies any type of vertigo/sense of movement. He is reaching to hold onto objects to maintain his balance (IV pole, railing), yet did not want to use a RW (which he owns from prior hospitalization).  Wife is to bring his glasses today. Pt and RN educated that he needs to walk at least two more times in the hallway today. Both parties agreeable. RN plans to offer use of RW again.   Pt currently with functional limitations due to the deficits listed below (see PT Problem List). Pt will benefit from skilled PT to increase their independence and safety with mobility to allow discharge to the venue listed below. Will plan to see again on 2/19 if pt is not discharged home later today.      PT Assessment  Patient needs continued PT services    Follow Up Recommendations  Home health PT vs No PT follow up (depending on progress);  Supervision for mobility/OOB        Barriers to Discharge Decreased caregiver support wife works; schedule flexible but not home all the time     Equipment Recommendations  None recommended by PT (pt owns a cane and RW)        Frequency Min 4X/week    Precautions / Restrictions  Precautions Precautions: Fall   Pertinent Vitals/Pain Denies pain; denies light-headedness      Mobility  Bed Mobility Overal bed mobility: Modified Independent Transfers Overall transfer level: Needs assistance Equipment used: None Transfers: Sit to/from Stand Sit to Stand: Min guard General transfer comment: Pt reports feeling dizzy (then states feels dysequilibrium) when standing and walking. Guarding, but no loss of balance Ambulation/Gait Ambulation/Gait assistance: Min assist Ambulation Distance (Feet): 175 Feet Assistive device: None (IV pole vs rail in hall) Gait Pattern/deviations: Step-through pattern;Decreased stride length;Staggering left;Wide base of support Gait velocity interpretation: Below normal speed for age/gender General Gait Details: Pt initially not holding onto any device for UE support, however with stagger to his left, offered for him to use a RW (like he has at home). Pt refused and preferred to hold onto IV pole vs rail along hallway.    Exercises     PT Diagnosis: Difficulty walking  PT Problem List: Decreased balance;Decreased mobility;Decreased knowledge of use of DME;Other (comment) (impaired vision) PT Treatment Interventions: DME instruction;Gait training;Stair training;Functional mobility training;Therapeutic activities;Balance training;Patient/family education     PT Goals(Current goals can be found in the care plan section) Acute Rehab PT Goals Patient Stated Goal: go home tomorrow PT Goal Formulation: With patient Time For Goal Achievement: 05/21/13 Potential to Achieve Goals: Good  Visit Information  Last PT Received On: 05/20/13 Assistance Needed: +1 History of Present Illness: adm s/p fall with Right ocular trauma with conjunctival hemorrhage  and peri-ocular hematoma. Pt normally does not consume alcohol and reports he had a shot of whiskey to celebrate (with his friends) 2 yrs since his kidney transplant. Later stood to leave and  fell into wood burning stove       Prior [place] expects to be discharged to:: Private residence Living Arrangements: Spouse/significant other Available Help at Discharge: Family;Available PRN/intermittently Type of Home: House Home Access: Stairs to enter [employer] of Steps: 2 Entrance Stairs-Rails: None Home Layout: One level Home Equipment: Cane - single point;Walker - 2 wheels Prior Function Level of Independence: Independent Communication Communication: No difficulties    Cognition  Cognition Arousal/Alertness: Awake/alert Behavior During Therapy: WFL for tasks assessed/performed Overall Cognitive Status: Within Functional Limits for tasks assessed    Extremity/Trunk Assessment Upper Extremity Assessment Upper Extremity Assessment: Overall WFL for tasks assessed Lower Extremity Assessment Lower Extremity Assessment: Overall WFL for tasks assessed Cervical / Trunk Assessment Cervical / Trunk Assessment: Normal   Balance Balance Overall balance assessment: Needs assistance Sitting-balance support: No upper extremity supported;Feet supported Sitting balance-Leahy Scale: Normal Sitting balance - Comments: donned his shoes independently reaching down to his feet Standing balance support: No upper extremity supported Standing balance-Leahy Scale: Fair Standing balance comment: wide base of support; in his shoes; sways with attempts at perturbation [place] - Eyes Opened: 0 (could not attain position without UE support)  End of Session PT - End of Session Equipment Utilized During Treatment: Gait belt Activity Tolerance: Patient tolerated treatment well Patient left: in chair;with call bell/phone within reach Nurse Communication: Mobility status;Other (comment) (needs to ambulate 2 more times today; wife bringing glasses)  GP     Yutaka Holberg 05/20/2013, 11:43 AM

## 2013-05-20 NOTE — Progress Notes (Signed)
Patient close to going home.  Will get a bit more PT tomorrow.This patient has been seen and I agree with the findings and treatment plan.  Kathryne Eriksson. Dahlia Bailiff, MD, [place] [phone] (pager) [phone] (direct pager) Trauma Surgeon

## 2013-05-21 MED ORDER — PREDNISONE 10 MG PO TABS: ORAL_TABLET | ORAL | Status: DC

## 2013-05-21 MED ORDER — CEPHALEXIN 500 MG PO CAPS: 500.0000 mg | ORAL_CAPSULE | Freq: Two times a day (BID) | ORAL | Status: DC

## 2013-05-21 MED ORDER — TOBRAMYCIN-DEXAMETHASONE 0.3-0.1 % OP OINT: TOPICAL_OINTMENT | Freq: Two times a day (BID) | OPHTHALMIC | Status: DC

## 2013-05-21 NOTE — Progress Notes (Signed)
Patient ID: [name], male   DOB: Mar 05, 1941, 73 y.o.   MRN: [id number]   LOS: 3 days   Subjective: Did well with therapies, ready to go home.   Objective: Vital signs in last 24 hours: Temp:  [98.3 F (36.8 C)-98.5 F (36.9 C)] 98.5 F (36.9 C) (02/19 [id number]) Pulse Rate:  [68-80] 68 (02/19 0613) Resp:  [18] 18 (02/19 0613) BP: (147-150)/(85-91) 147/91 mmHg (02/19 0613) SpO2:  [96 %-100 %] 96 % (02/19 0613) Last BM Date: 05/18/13   Physical Exam General appearance: alert and no distress Resp: clear to auscultation bilaterally Cardio: regular rate and rhythm GI: normal findings: bowel sounds normal and soft, non-tender   Assessment/Plan: Ground level fall  TBI w/SDH  Right ocular trauma with conjunctival hemorrhage and peri-ocular hematoma - Appreciate ophthalmology evaluation  HTN -- Home meds  ETOH use  Dispo -- D/C home    Lisette Abu, PA-C Pager: [phone] General Trauma PA Pager: [phone]  05/21/2013

## 2013-05-21 NOTE — Progress Notes (Signed)
Physical Therapy Treatment Patient Details Name: [name] MRN: [id number] DOB: [birth year] Today's Date: 05/21/2013 Time: 0800-0810 PT Time Calculation (min): 10 min  PT Assessment / Plan / Recommendation  History of Present Illness adm s/p fall with Right ocular trauma with conjunctival hemorrhage and peri-ocular hematoma. Pt normally does not consume alcohol and reports he had a shot of whiskey to celebrate (with his friends) 2 yrs since his kidney transplant. Later stood to leave and fell into wood burning stove   PT Comments   Pt with improved balance and mobility during session.  No signs of imbalance and pt safe for d/c.  No further skilled PT needs identified.  Will d/c PT.  Follow Up Recommendations  No PT follow up;Supervision - Intermittent     Does the patient have the potential to tolerate intense rehabilitation     Barriers to Discharge        Equipment Recommendations  None recommended by PT    Recommendations for Other Services    Frequency Min 4X/week   Progress towards PT Goals Progress towards PT goals: Goals met/education completed, patient discharged from PT  Plan Current plan remains appropriate    Precautions / Restrictions Precautions Precautions: Fall   Pertinent Vitals/Pain Pt denied pain    Mobility  Transfers Overall transfer level: Independent Equipment used: None Transfers: Sit to/from Stand Sit to Stand: Independent Ambulation/Gait Ambulation/Gait assistance: Supervision Ambulation Distance (Feet): 200 Feet Assistive device: None Gait Pattern/deviations: WFL(Within Functional Limits);Antalgic (Mild antalgic gait on L due to "bad knee") Gait velocity interpretation: at or above normal speed for age/gender General Gait Details: Pt. requesting to amb near wall however did not use rails Stairs: Yes Stairs assistance: Min assist Stair Management: Backwards (with hand held assist) Number of Stairs: 2 General stair comments: discussed step  to pattern with correct sequencing to accommodate for L knee pain    Exercises     PT Diagnosis:    PT Problem List:   PT Treatment Interventions:     PT Goals (current goals can now be found in the care plan section) Acute Rehab PT Goals Patient Stated Goal: to go home PT Goal Formulation: With patient Time For Goal Achievement: 05/21/13 Potential to Achieve Goals: Good  Visit Information  Last PT Received On: 05/21/13 Assistance Needed: +1 History of Present Illness: adm s/p fall with Right ocular trauma with conjunctival hemorrhage and peri-ocular hematoma. Pt normally does not consume alcohol and reports he had a shot of whiskey to celebrate (with his friends) 2 yrs since his kidney transplant. Later stood to leave and fell into wood burning stove    Subjective Data  Subjective: "I'm a jitter bug; I ain't no old man." Patient Stated Goal: to go home   Cognition  Cognition Arousal/Alertness: Awake/alert Behavior During Therapy: WFL for tasks assessed/performed Overall Cognitive Status: Within Functional Limits for tasks assessed    Balance  Balance Overall balance assessment: Independent Sitting-balance support: Feet unsupported;No upper extremity supported Sitting balance-Leahy Scale: Normal Sitting balance - Comments: donned his shoes independently reaching down to his feet Standing balance support: No upper extremity supported;During functional activity Standing balance-Leahy Scale: Good  End of Session PT - End of Session Equipment Utilized During Treatment: Gait belt Activity Tolerance: Patient tolerated treatment well Patient left: with nursing/sitter in room;Other (comment) (up in room with RN tech preparing for shower) Nurse Communication: Mobility status   GP     Moshe Cipro K 05/21/2013, 8:20 AM  Clarita Crane, PT, DPT  [phone]  

## 2013-05-21 NOTE — Discharge Summary (Signed)
Physician Discharge Summary  Patient ID: [name] MRN: [id number] DOB/AGE: 1940-04-28 73 y.o.  Admit date: 05/18/2013 Discharge date: 05/21/2013  Discharge Diagnoses Patient Active Problem List   Diagnosis Date Noted  . Fall 05/20/2013  . Traumatic subdural hematoma 05/20/2013  . Gout 05/20/2013  . Hyperlipidemia 05/20/2013  . HTN (hypertension) 05/20/2013  . History of renal transplant 05/20/2013  . GERD (gastroesophageal reflux disease) 05/20/2013  . Acute alcohol intoxication 05/20/2013  . Traumatic orbital hematoma 05/18/2013  . Subconjunctival hemorrhage of right eye 05/18/2013    Consultants Dr. Gevena Cotton for ophthalmology   Procedures None   HPI: [name] had been drinking alcohol. He got up from a sitting position and lost his balance and fell, hitting his face on an iron stove. He went to [facility] where a CT scan of the head demonstrated a right ocular hematoma with some proptosis and a possible SDH anteriorly along the falx. He was transferred to [facility] and ophthalmology was consulted. The patient was neurologically intact and so neurosurgery was not involved.   Hospital Course: The patient did well in the hospital. Ophthalmology recommended medical treatment for the patient's eye injury. A repeat head CT the next day showed improvement in the patient's subdural hematoma and he remained neurologically normal throughout his stay. He was mobilized with physical and occupational therapies and after a few sessions was able to be discharged home in good condition.      Medication List         allopurinol 100 MG tablet  Commonly known as:  ZYLOPRIM  Take 100 mg by mouth daily.     aspirin 81 MG chewable tablet  Chew 81 mg by mouth daily.     cephALEXin 500 MG capsule  Commonly known as:  KEFLEX  Take 1 capsule (500 mg total) by mouth every 12 (twelve) hours.     HYDROcodone-acetaminophen 10-325 MG per tablet  Commonly known as:  NORCO  Take 1 tablet by  mouth every 6 (six) hours as needed for moderate pain.     mycophenolate 180 MG EC tablet  Commonly known as:  MYFORTIC  Take 360 mg by mouth 2 (two) times daily.     predniSONE 10 MG tablet  Commonly known as:  DELTASONE  40mg  PO qAM x3d, then 20mg  PO qAM x2d, then 10mg  PO qAM x2d     ranitidine 75 MG tablet  Commonly known as:  ZANTAC  Take 75 mg by mouth daily.     simvastatin 20 MG tablet  Commonly known as:  ZOCOR  Take 20 mg by mouth every evening.     sodium bicarbonate 650 MG tablet  Take 650 mg by mouth 3 (three) times daily.     sulfamethoxazole-trimethoprim 400-80 MG per tablet  Commonly known as:  BACTRIM,SEPTRA  Take 1 tablet by mouth 3 (three) times a week. Monday, Wednesday and friday     tacrolimus 1 MG capsule  Commonly known as:  PROGRAF  Take 3-4 mg by mouth 2 (two) times daily. 4mg  in the morning and 3 mg in the evening     tobramycin-dexamethasone ophthalmic ointment  Commonly known as:  TOBRADEX  Place into the right eye 2 (two) times daily.             Follow-up Information   Schedule an appointment as soon as possible for a visit with Dara Hoyer, MD.   Specialty:  Ophthalmology   Contact information:   [address] [facility]  [place] [phone]       Call [place]. (As needed)    Contact information:   [address] [place] [address] [phone]       Signed: Lisette Abu, PA-C Pager: [phone] General Trauma PA Pager: [phone]  05/21/2013, 11:01 AM

## 2013-05-21 NOTE — Discharge Summary (Signed)
Michael Starnes, MD, MPH, FACS Pager: [phone]  

## 2013-05-21 NOTE — Progress Notes (Signed)
Patient provided with discharge instructions and follow up information. His prescriptions have been sent to his pharmacy of choice. He is going home at this time with wife and son. NO further questions at this time.

## 2013-05-21 NOTE — Progress Notes (Signed)
Patient examined and I agree with the assessment and plan  Georganna Skeans, MD, MPH, FACS Pager: [phone]  05/21/2013 11:20 AM

## 2016-01-14 ENCOUNTER — Emergency Department (HOSPITAL_BASED_OUTPATIENT_CLINIC_OR_DEPARTMENT_OTHER)
Admission: EM | Admit: 2016-01-14 | Discharge: 2016-01-14 | Disposition: A | Discharge status: Home / Self Care | Payer: Medicare Other | Attending: Emergency Medicine | Admitting: Emergency Medicine

## 2016-01-14 ENCOUNTER — Emergency Department (HOSPITAL_BASED_OUTPATIENT_CLINIC_OR_DEPARTMENT_OTHER): Payer: Medicare Other

## 2016-01-14 ENCOUNTER — Encounter (HOSPITAL_BASED_OUTPATIENT_CLINIC_OR_DEPARTMENT_OTHER): Payer: Self-pay | Admitting: *Deleted

## 2016-01-14 DIAGNOSIS — Z7982 Long term (current) use of aspirin: Secondary | ICD-10-CM | POA: Diagnosis not present

## 2016-01-14 DIAGNOSIS — Z79899 Other long term (current) drug therapy: Secondary | ICD-10-CM | POA: Diagnosis not present

## 2016-01-14 DIAGNOSIS — I129 Hypertensive chronic kidney disease with stage 1 through stage 4 chronic kidney disease, or unspecified chronic kidney disease: Secondary | ICD-10-CM | POA: Diagnosis not present

## 2016-01-14 DIAGNOSIS — W01118A Fall on same level from slipping, tripping and stumbling with subsequent striking against other sharp object, initial encounter: Secondary | ICD-10-CM | POA: Diagnosis not present

## 2016-01-14 DIAGNOSIS — Y999 Unspecified external cause status: Secondary | ICD-10-CM | POA: Diagnosis not present

## 2016-01-14 DIAGNOSIS — N189 Chronic kidney disease, unspecified: Secondary | ICD-10-CM | POA: Diagnosis not present

## 2016-01-14 DIAGNOSIS — S299XXA Unspecified injury of thorax, initial encounter: Secondary | ICD-10-CM | POA: Diagnosis present

## 2016-01-14 DIAGNOSIS — Y929 Unspecified place or not applicable: Secondary | ICD-10-CM | POA: Insufficient documentation

## 2016-01-14 DIAGNOSIS — Y939 Activity, unspecified: Secondary | ICD-10-CM | POA: Diagnosis not present

## 2016-01-14 DIAGNOSIS — S20212A Contusion of left front wall of thorax, initial encounter: Secondary | ICD-10-CM | POA: Insufficient documentation

## 2016-01-14 MED ORDER — HYDROCODONE-ACETAMINOPHEN 5-325 MG PO TABS: 1.0000 | ORAL_TABLET | Freq: Four times a day (QID) | ORAL | 0 refills | Status: AC | PRN

## 2016-01-14 MED ORDER — HYDROCODONE-ACETAMINOPHEN 5-325 MG PO TABS
1.0000 | ORAL_TABLET | Freq: Once | ORAL | Status: AC
  Administered 2016-01-14: 1 via ORAL
  Filled 2016-01-14: qty 1

## 2016-01-14 NOTE — Discharge Instructions (Signed)
Take the pain medication as directed. Work on deep breathing. Return for any new or worse symptoms. Make appointment to follow-up with your regular doctor for recheck in a week.

## 2016-01-14 NOTE — ED Triage Notes (Signed)
Pt c/o fall from standing landing on side of chair injuring left ribs x 4 hrs ago

## 2016-01-14 NOTE — ED Provider Notes (Signed)
Dictation #1 [id number]  [id number]  [place] DEPT [facility] Provider Note   CSN: [id number] Arrival date & time: 01/14/16  1207     History   Chief Complaint Chief Complaint  Patient presents with  . Fall    HPI [name] is a 75 y.o. male.  Patient with fall yesterday evening around 8:00 in the evening. Patient tripped over rug and fell on his left side striking his left lateral chest: Arm chair. No loss of consciousness. Patient denies any abdominal pain nausea or vomiting. No back pain no neck pain arms and legs feel fine. Pain is to left lateral chest. Hurts when he breathes.      Past Medical History:  Diagnosis Date  . Chronic kidney disease    HX OF KIDNEY TRANSPLANT 05/2011  . GERD (gastroesophageal reflux disease)   . Gout   . H/O kidney transplant   . Hypertension   . Trauma to eye, right 05/18/2013    Patient Active Problem List   Diagnosis Date Noted  . Fall 05/20/2013  . Traumatic subdural hematoma ([place]) 05/20/2013  . Gout 05/20/2013  . Hyperlipidemia 05/20/2013  . HTN (hypertension) 05/20/2013  . History of renal transplant 05/20/2013  . GERD (gastroesophageal reflux disease) 05/20/2013  . Acute alcohol intoxication ([place]) 05/20/2013  . Traumatic orbital hematoma 05/18/2013  . Subconjunctival hemorrhage of right eye 05/18/2013    Past Surgical History:  Procedure Laterality Date  . CHOLECYSTECTOMY    . KIDNEY TRANSPLANT         Home Medications    Prior to Admission medications   Medication Sig Start Date End Date Taking? Authorizing Provider  allopurinol (ZYLOPRIM) 100 MG tablet Take 100 mg by mouth daily.    Historical Provider, MD  aspirin 81 MG chewable tablet Chew 81 mg by mouth daily.    Historical Provider, MD  cephALEXin (KEFLEX) 500 MG capsule Take 1 capsule (500 mg total) by mouth every 12 (twelve) hours. 05/21/13   Lisette Abu, PA-C  HYDROcodone-acetaminophen (NORCO) 10-325 MG per tablet Take 1 tablet by mouth  every 6 (six) hours as needed for moderate pain.    Historical Provider, MD  HYDROcodone-acetaminophen (NORCO/VICODIN) 5-325 MG tablet Take 1-2 tablets by mouth every 6 (six) hours as needed for moderate pain. 01/14/16   Fredia Sorrow, MD  mycophenolate (MYFORTIC) 180 MG EC tablet Take 360 mg by mouth 2 (two) times daily.    Historical Provider, MD  predniSONE (DELTASONE) 10 MG tablet 40mg  PO qAM x3d, then 20mg  PO qAM x2d, then 10mg  PO qAM x2d 05/21/13   Lisette Abu, PA-C  ranitidine (ZANTAC) 75 MG tablet Take 75 mg by mouth daily.    Historical Provider, MD  simvastatin (ZOCOR) 20 MG tablet Take 20 mg by mouth every evening.    Historical Provider, MD  sodium bicarbonate 650 MG tablet Take 650 mg by mouth 3 (three) times daily.    Historical Provider, MD  sulfamethoxazole-trimethoprim (BACTRIM,SEPTRA) 400-80 MG per tablet Take 1 tablet by mouth 3 (three) times a week. Monday, Wednesday and friday    Historical Provider, MD  tacrolimus (PROGRAF) 1 MG capsule Take 3-4 mg by mouth 2 (two) times daily. 4mg  in the morning and 3 mg in the evening    Historical Provider, MD  tobramycin-dexamethasone [facility]) ophthalmic ointment Place into the right eye 2 (two) times daily. 05/21/13   Lisette Abu, PA-C    Family History History reviewed. No pertinent family history.  Social History Social History  Substance Use Topics  . Smoking status: Never Smoker  . Smokeless tobacco: Never Used  . Alcohol use No     Allergies   Review of patient's allergies indicates no known allergies.   Review of Systems Review of Systems  Constitutional: Negative for fever.  HENT: Negative for congestion.   Eyes: Negative for visual disturbance.  Respiratory: Positive for shortness of breath.   Cardiovascular: Positive for chest pain.  Gastrointestinal: Negative for abdominal pain, nausea and vomiting.  Genitourinary: Negative for dysuria.  Musculoskeletal: Negative for back pain and neck pain.    Skin: Negative for rash.  Neurological: Negative for headaches.  Hematological: Does not bruise/bleed easily.  Psychiatric/Behavioral: Negative for confusion.     Physical Exam Updated Vital Signs BP 180/97 (BP Location: Left Arm)   Pulse 95   Temp 98.1 F (36.7 C) (Oral)   Resp 16   Ht 5\' 7"  (1.702 m)   Wt 83.9 kg   SpO2 97%   BMI 28.98 kg/m   Physical Exam  Constitutional: He is oriented to person, place, and time. He appears well-developed and well-nourished. No distress.  HENT:  Head: Normocephalic and atraumatic.  Mouth/Throat: Oropharynx is clear and moist.  Eyes: EOM are normal. Pupils are equal, round, and reactive to light.  Neck: Normal range of motion. Neck supple.  Cardiovascular: Normal rate and regular rhythm.   Pulmonary/Chest: Effort normal and breath sounds normal. No respiratory distress. He has no wheezes. He has no rales. He exhibits tenderness.  Abdominal: Soft. Bowel sounds are normal. He exhibits no distension. There is no tenderness.  Musculoskeletal: Normal range of motion. He exhibits no edema.  Neurological: He is alert and oriented to person, place, and time. No cranial nerve deficit. He exhibits normal muscle tone. Coordination normal.  Skin: Skin is warm.  Nursing note and vitals reviewed.    ED Treatments / Results  Labs (all labs ordered are listed, but only abnormal results are displayed) Labs Reviewed - No data to display  EKG  EKG Interpretation None       Radiology Dg Ribs Unilateral W/chest Left  Result Date: 01/14/2016 CLINICAL DATA:  Left lateral rib pain after a fall yesterday and, pain with inspiration, cough, initial encounter. EXAM: LEFT RIBS AND CHEST - 3+ VIEW COMPARISON:  None available at the time of dictation. FINDINGS: Frontal view of the chest shows midline trachea. Heart size within normal limits. Lungs are low in volume with an elevated right hemidiaphragm. Minimal bibasilar subsegmental atelectasis. No  definite pleural fluid. Dedicated views of the left ribs show no definite fracture. IMPRESSION: No acute findings.  No definite fracture. Electronically Signed   By: Lorin Picket M.D.   On: 01/14/2016 12:40    Procedures Procedures (including critical care time)  Medications Ordered in ED Medications - No data to display   Initial Impression / Assessment and Plan / ED Course  I have reviewed the triage vital signs and the nursing notes.  Pertinent labs & imaging results that were available during my care of the patient were reviewed by me and considered in my medical decision making (see chart for details).  Clinical Course    Patient with fall from standing position tripped and landed on left-sided chest on an arm chair. Patient with pain to that area. X-rays without any lung abnormalities no evidence of any rib fractures. Clinically patient certainly has rib contusions. Will treat with pain medication and follow-up with his regular doctor. Patient without any hypoxia room  air sats were 97%. Patient nontoxic no acute distress. No tenderness to the abdomen at all.  Final Clinical Impressions(s) / ED Diagnoses   Final diagnoses:  Rib contusion, left, initial encounter    New Prescriptions New Prescriptions   HYDROCODONE-ACETAMINOPHEN (NORCO/VICODIN) 5-325 MG TABLET    Take 1-2 tablets by mouth every 6 (six) hours as needed for moderate pain.     Fredia Sorrow, MD 01/14/16 1351

## 2016-04-02 ENCOUNTER — Emergency Department (HOSPITAL_BASED_OUTPATIENT_CLINIC_OR_DEPARTMENT_OTHER)
Admission: EM | Admit: 2016-04-02 | Discharge: 2016-04-02 | Disposition: A | Discharge status: Home / Self Care | Payer: Medicare Other | Attending: Emergency Medicine | Admitting: Emergency Medicine

## 2016-04-02 ENCOUNTER — Encounter (HOSPITAL_BASED_OUTPATIENT_CLINIC_OR_DEPARTMENT_OTHER): Payer: Self-pay | Admitting: *Deleted

## 2016-04-02 ENCOUNTER — Emergency Department (HOSPITAL_BASED_OUTPATIENT_CLINIC_OR_DEPARTMENT_OTHER): Payer: Medicare Other

## 2016-04-02 DIAGNOSIS — Z79899 Other long term (current) drug therapy: Secondary | ICD-10-CM | POA: Insufficient documentation

## 2016-04-02 DIAGNOSIS — R05 Cough: Secondary | ICD-10-CM | POA: Diagnosis present

## 2016-04-02 DIAGNOSIS — N189 Chronic kidney disease, unspecified: Secondary | ICD-10-CM | POA: Insufficient documentation

## 2016-04-02 DIAGNOSIS — J069 Acute upper respiratory infection, unspecified: Secondary | ICD-10-CM

## 2016-04-02 DIAGNOSIS — I129 Hypertensive chronic kidney disease with stage 1 through stage 4 chronic kidney disease, or unspecified chronic kidney disease: Secondary | ICD-10-CM | POA: Diagnosis not present

## 2016-04-02 DIAGNOSIS — Z7982 Long term (current) use of aspirin: Secondary | ICD-10-CM | POA: Insufficient documentation

## 2016-04-02 DIAGNOSIS — R059 Cough, unspecified: Secondary | ICD-10-CM

## 2016-04-02 MED ORDER — BENZONATATE 100 MG PO CAPS: 100.0000 mg | ORAL_CAPSULE | Freq: Three times a day (TID) | ORAL | 0 refills | Status: DC | PRN

## 2016-04-02 NOTE — ED Notes (Signed)
ED Provider at bedside. 

## 2016-04-02 NOTE — Discharge Instructions (Signed)
1. Medications: flonase and mucinex for nasal congestion, tessalon for cough, continue usual home medications 2. Treatment: rest, drink plenty of fluids 3. Follow Up: Please follow up with your primary doctor for discussion of your diagnoses and further evaluation after today's visit if symptoms persist longer than 10-14 days; Return to the ER for high fevers, difficulty breathing or other concerning symptoms

## 2016-04-02 NOTE — ED Provider Notes (Signed)
[place] DEPT [facility] Provider Note   CSN: [id number] Arrival date & time: 04/02/16  1251     History   Chief Complaint Chief Complaint  Patient presents with  . Cough    HPI [name] is a 76 y.o. male.  The history is provided by the patient and medical records. No language interpreter was used.  Cough  Associated symptoms include sore throat. Pertinent negatives include no chest pain, no chills and no shortness of breath.   [name] is a 76 y.o. male  with a PMH of HTN, renal transplant who presents to the Emergency Department complaining of persistent nasal congestion and dry cough x 1 week. Associated with mild sore throat. No medications taken prior to arrival for symptoms. No alleviating or aggravating factors noted. No chest pain, shortness of breath, fevers/chills.    Past Medical History:  Diagnosis Date  . Chronic kidney disease    HX OF KIDNEY TRANSPLANT 05/2011  . GERD (gastroesophageal reflux disease)   . Gout   . H/O kidney transplant   . Hypertension   . Trauma to eye, right 05/18/2013    Patient Active Problem List   Diagnosis Date Noted  . Fall 05/20/2013  . Traumatic subdural hematoma ([place]) 05/20/2013  . Gout 05/20/2013  . Hyperlipidemia 05/20/2013  . HTN (hypertension) 05/20/2013  . History of renal transplant 05/20/2013  . GERD (gastroesophageal reflux disease) 05/20/2013  . Acute alcohol intoxication ([place]) 05/20/2013  . Traumatic orbital hematoma 05/18/2013  . Subconjunctival hemorrhage of right eye 05/18/2013    Past Surgical History:  Procedure Laterality Date  . CHOLECYSTECTOMY    . KIDNEY TRANSPLANT         Home Medications    Prior to Admission medications   Medication Sig Start Date End Date Taking? Authorizing Provider  allopurinol (ZYLOPRIM) 100 MG tablet Take 100 mg by mouth daily.    Historical Provider, MD  aspirin 81 MG chewable tablet Chew 81 mg by mouth daily.    Historical Provider, MD  benzonatate  (TESSALON PERLES) 100 MG capsule Take 1 capsule (100 mg total) by mouth 3 (three) times daily as needed for cough. 04/02/16   Ozella Almond Yeng Perz, PA-C  HYDROcodone-acetaminophen (NORCO) 10-325 MG per tablet Take 1 tablet by mouth every 6 (six) hours as needed for moderate pain.    Historical Provider, MD  HYDROcodone-acetaminophen (NORCO/VICODIN) 5-325 MG tablet Take 1-2 tablets by mouth every 6 (six) hours as needed for moderate pain. 01/14/16   Fredia Sorrow, MD  mycophenolate (MYFORTIC) 180 MG EC tablet Take 360 mg by mouth 2 (two) times daily.    Historical Provider, MD  ranitidine (ZANTAC) 75 MG tablet Take 75 mg by mouth daily.    Historical Provider, MD  simvastatin (ZOCOR) 20 MG tablet Take 20 mg by mouth every evening.    Historical Provider, MD  sodium bicarbonate 650 MG tablet Take 650 mg by mouth 3 (three) times daily.    Historical Provider, MD  sulfamethoxazole-trimethoprim (BACTRIM,SEPTRA) 400-80 MG per tablet Take 1 tablet by mouth 3 (three) times a week. Monday, Wednesday and friday    Historical Provider, MD  tacrolimus (PROGRAF) 1 MG capsule Take 3-4 mg by mouth 2 (two) times daily. 4mg  in the morning and 3 mg in the evening    Historical Provider, MD    Family History History reviewed. No pertinent family history.  Social History Social History  Substance Use Topics  . Smoking status: Never Smoker  . Smokeless tobacco: Never Used  .  Alcohol use No     Allergies   Patient has no known allergies.   Review of Systems Review of Systems  Constitutional: Negative for chills and fever.  HENT: Positive for congestion and sore throat. Negative for trouble swallowing.   Respiratory: Positive for cough. Negative for shortness of breath.   Cardiovascular: Negative for chest pain.  Gastrointestinal: Negative for abdominal pain.     Physical Exam Updated Vital Signs BP 148/82 (BP Location: Right Arm)   Pulse 93   Temp 98.4 F (36.9 C) (Oral)   Resp 18   Ht 5\' 6"   (1.676 m)   Wt 86.2 kg   SpO2 95%   BMI 30.67 kg/m   Physical Exam  Constitutional: He is oriented to person, place, and time. He appears well-developed and well-nourished. No distress.  Nontoxic appearing.  HENT:  Head: Normocephalic and atraumatic.  OP with erythema, no exudates or tonsillar hypertrophy. No focal areas of sinus tenderness.  Neck: Normal range of motion. Neck supple.  Cardiovascular: Normal rate, regular rhythm and normal heart sounds.   Pulmonary/Chest: Effort normal.  Lungs are clear to auscultation bilaterally - no w/r/r  Abdominal: Soft. He exhibits no distension. There is no tenderness.  Musculoskeletal: Normal range of motion.  Neurological: He is alert and oriented to person, place, and time.  Skin: Skin is warm and dry. He is not diaphoretic.  Nursing note and vitals reviewed.    ED Treatments / Results  Labs (all labs ordered are listed, but only abnormal results are displayed) Labs Reviewed - No data to display  EKG  EKG Interpretation None       Radiology Dg Chest 2 View  Result Date: 04/02/2016 CLINICAL DATA:  Cough for 1 week EXAM: CHEST  2 VIEW COMPARISON:  01/14/2016 FINDINGS: Elevation of the right hemidiaphragm. Heart is normal size. No confluent airspace opacities or effusions. No acute bony abnormality. IMPRESSION: No active cardiopulmonary disease. Electronically Signed   By: Rolm Baptise M.D.   On: 04/02/2016 14:04    Procedures Procedures (including critical care time)  Medications Ordered in ED Medications - No data to display   Initial Impression / Assessment and Plan / ED Course  I have reviewed the triage vital signs and the nursing notes.  Pertinent labs & imaging results that were available during my care of the patient were reviewed by me and considered in my medical decision making (see chart for details).  Clinical Course    [name] is a 76 y.o. male who presents to ED for cough, congestion x 1 week. He is  afebrile, non-toxic appearing with a clear lung exam. Mild rhinorrhea and OP with erythema, no exudates. CXR negative. Likely viral URI. Patient is agreeable to symptomatic treatment with close follow up with PCP as needed but spoke about emergent, changing, or worsening of symptoms that should prompt return to ER. Patient voices understanding and is agreeable to plan.   Patient discussed with Dr. Ellender Hose who agrees with treatment plan.   Final Clinical Impressions(s) / ED Diagnoses   Final diagnoses:  Cough  Upper respiratory tract infection, unspecified type    New Prescriptions Discharge Medication List as of 04/02/2016  2:36 PM    START taking these medications   Details  benzonatate (TESSALON PERLES) 100 MG capsule Take 1 capsule (100 mg total) by mouth 3 (three) times daily as needed for cough., Starting Mon 04/02/2016, Print         [employer], PA-C 04/02/16  [place], MD 04/03/16 [id number]

## 2016-04-02 NOTE — ED Triage Notes (Signed)
Pt c/o cough x 1 week.

## 2016-11-09 ENCOUNTER — Telehealth: Payer: Self-pay | Admitting: *Deleted

## 2016-11-09 NOTE — Telephone Encounter (Signed)
Entered in error

## 2018-05-23 ENCOUNTER — Inpatient Hospital Stay (HOSPITAL_BASED_OUTPATIENT_CLINIC_OR_DEPARTMENT_OTHER)
Admission: EM | Admit: 2018-05-23 | Discharge: 2018-05-26 | DRG: 872 | Disposition: A | Discharge status: Home / Self Care | Payer: Medicare Other | Attending: Internal Medicine | Admitting: Internal Medicine

## 2018-05-23 ENCOUNTER — Other Ambulatory Visit: Payer: Self-pay

## 2018-05-23 ENCOUNTER — Emergency Department (HOSPITAL_BASED_OUTPATIENT_CLINIC_OR_DEPARTMENT_OTHER): Payer: Medicare Other

## 2018-05-23 ENCOUNTER — Encounter (HOSPITAL_BASED_OUTPATIENT_CLINIC_OR_DEPARTMENT_OTHER): Payer: Self-pay | Admitting: *Deleted

## 2018-05-23 DIAGNOSIS — K219 Gastro-esophageal reflux disease without esophagitis: Secondary | ICD-10-CM | POA: Diagnosis present

## 2018-05-23 DIAGNOSIS — R0603 Acute respiratory distress: Secondary | ICD-10-CM | POA: Diagnosis not present

## 2018-05-23 DIAGNOSIS — E785 Hyperlipidemia, unspecified: Secondary | ICD-10-CM | POA: Diagnosis present

## 2018-05-23 DIAGNOSIS — Y830 Surgical operation with transplant of whole organ as the cause of abnormal reaction of the patient, or of later complication, without mention of misadventure at the time of the procedure: Secondary | ICD-10-CM | POA: Diagnosis present

## 2018-05-23 DIAGNOSIS — G8929 Other chronic pain: Secondary | ICD-10-CM | POA: Diagnosis present

## 2018-05-23 DIAGNOSIS — Z8261 Family history of arthritis: Secondary | ICD-10-CM

## 2018-05-23 DIAGNOSIS — Z940 Kidney transplant status: Secondary | ICD-10-CM | POA: Diagnosis not present

## 2018-05-23 DIAGNOSIS — N183 Chronic kidney disease, stage 3 (moderate): Secondary | ICD-10-CM | POA: Diagnosis present

## 2018-05-23 DIAGNOSIS — Z9049 Acquired absence of other specified parts of digestive tract: Secondary | ICD-10-CM

## 2018-05-23 DIAGNOSIS — M549 Dorsalgia, unspecified: Secondary | ICD-10-CM | POA: Diagnosis present

## 2018-05-23 DIAGNOSIS — J101 Influenza due to other identified influenza virus with other respiratory manifestations: Secondary | ICD-10-CM

## 2018-05-23 DIAGNOSIS — I129 Hypertensive chronic kidney disease with stage 1 through stage 4 chronic kidney disease, or unspecified chronic kidney disease: Secondary | ICD-10-CM | POA: Diagnosis present

## 2018-05-23 DIAGNOSIS — I10 Essential (primary) hypertension: Secondary | ICD-10-CM | POA: Diagnosis present

## 2018-05-23 DIAGNOSIS — Z79899 Other long term (current) drug therapy: Secondary | ICD-10-CM | POA: Diagnosis not present

## 2018-05-23 DIAGNOSIS — A4189 Other specified sepsis: Principal | ICD-10-CM | POA: Diagnosis present

## 2018-05-23 DIAGNOSIS — R000 Tachycardia, unspecified: Secondary | ICD-10-CM | POA: Diagnosis not present

## 2018-05-23 DIAGNOSIS — M109 Gout, unspecified: Secondary | ICD-10-CM | POA: Diagnosis present

## 2018-05-23 DIAGNOSIS — Z7982 Long term (current) use of aspirin: Secondary | ICD-10-CM

## 2018-05-23 DIAGNOSIS — T8619 Other complication of kidney transplant: Secondary | ICD-10-CM | POA: Diagnosis present

## 2018-05-23 DIAGNOSIS — Z79891 Long term (current) use of opiate analgesic: Secondary | ICD-10-CM | POA: Diagnosis not present

## 2018-05-23 DIAGNOSIS — Z8249 Family history of ischemic heart disease and other diseases of the circulatory system: Secondary | ICD-10-CM

## 2018-05-23 DIAGNOSIS — J069 Acute upper respiratory infection, unspecified: Secondary | ICD-10-CM

## 2018-05-23 DIAGNOSIS — N179 Acute kidney failure, unspecified: Secondary | ICD-10-CM

## 2018-05-23 DIAGNOSIS — R0602 Shortness of breath: Secondary | ICD-10-CM | POA: Diagnosis present

## 2018-05-23 LAB — TROPONIN I: Troponin I: <0.03 ng/mL (ref <0.03)

## 2018-05-23 LAB — URINALYSIS, ROUTINE W REFLEX MICROSCOPIC
Bilirubin Urine: NEGATIVE
Glucose, UA: 100 mg/dL — AB
KETONES UR: NEGATIVE mg/dL
Leukocytes,Ua: NEGATIVE
Nitrite: NEGATIVE
Protein, ur: NEGATIVE mg/dL
Specific Gravity, Urine: 1.015 (ref 1.005–1.030)
pH: 7.5 (ref 5.0–8.0)

## 2018-05-23 LAB — RESPIRATORY PANEL BY PCR
Adenovirus: NOT DETECTED
Bordetella pertussis: NOT DETECTED
Chlamydophila pneumoniae: NOT DETECTED
Coronavirus 229E: NOT DETECTED
Coronavirus HKU1: NOT DETECTED
Coronavirus NL63: NOT DETECTED
Coronavirus OC43: NOT DETECTED
INFLUENZA A H1 2009-RVPPR: DETECTED — AB
Influenza B: NOT DETECTED
Metapneumovirus: NOT DETECTED
Mycoplasma pneumoniae: NOT DETECTED
Parainfluenza Virus 1: NOT DETECTED
Parainfluenza Virus 2: NOT DETECTED
Parainfluenza Virus 3: NOT DETECTED
Parainfluenza Virus 4: NOT DETECTED
Respiratory Syncytial Virus: NOT DETECTED
Rhinovirus / Enterovirus: NOT DETECTED

## 2018-05-23 LAB — CBC WITH DIFFERENTIAL/PLATELET
Abs Immature Granulocytes: 0.05 10*3/uL (ref 0.00–0.07)
Basophils Absolute: 0 10*3/uL (ref 0.0–0.1)
Basophils Relative: 0 %
Eosinophils Absolute: 0 10*3/uL (ref 0.0–0.5)
Eosinophils Relative: 0 %
HCT: 41.3 % (ref 39.0–52.0)
Hemoglobin: 13.2 g/dL (ref 13.0–17.0)
IMMATURE GRANULOCYTES: 1 %
Lymphocytes Relative: 6 %
Lymphs Abs: 0.3 10*3/uL — ABNORMAL LOW (ref 0.7–4.0)
MCH: 30 pg (ref 26.0–34.0)
MCHC: 32 g/dL (ref 30.0–36.0)
MCV: 93.9 fL (ref 80.0–100.0)
Monocytes Absolute: 0.5 10*3/uL (ref 0.1–1.0)
Monocytes Relative: 8 %
Neutro Abs: 5.1 10*3/uL (ref 1.7–7.7)
Neutrophils Relative %: 85 %
Platelets: 118 10*3/uL — ABNORMAL LOW (ref 150–400)
RBC: 4.4 MIL/uL (ref 4.22–5.81)
RDW: 13.6 % (ref 11.5–15.5)
Smear Review: NORMAL
WBC: 5.9 10*3/uL (ref 4.0–10.5)
nRBC: 0 % (ref 0.0–0.2)

## 2018-05-23 LAB — BASIC METABOLIC PANEL
Anion gap: 11 (ref 5–15)
BUN: 21 mg/dL (ref 8–23)
CO2: 17 mmol/L — AB (ref 22–32)
Calcium: 9 mg/dL (ref 8.9–10.3)
Chloride: 105 mmol/L (ref 98–111)
Creatinine, Ser: 1.64 mg/dL — ABNORMAL HIGH (ref 0.61–1.24)
GFR calc non Af Amer: 40 mL/min — ABNORMAL LOW (ref >60)
GFR, EST AFRICAN AMERICAN: 46 mL/min — AB (ref >60)
Glucose, Bld: 145 mg/dL — ABNORMAL HIGH (ref 70–99)
Potassium: 4.2 mmol/L (ref 3.5–5.1)
Sodium: 133 mmol/L — ABNORMAL LOW (ref 135–145)

## 2018-05-23 LAB — MRSA PCR SCREENING: MRSA by PCR: NEGATIVE

## 2018-05-23 LAB — INFLUENZA PANEL BY PCR (TYPE A & B)
INFLAPCR: POSITIVE — AB
Influenza B By PCR: NEGATIVE

## 2018-05-23 LAB — URINALYSIS, MICROSCOPIC (REFLEX): WBC, UA: NONE SEEN WBC/hpf (ref 0–5)

## 2018-05-23 LAB — BRAIN NATRIURETIC PEPTIDE: B Natriuretic Peptide: 217.7 pg/mL — ABNORMAL HIGH (ref 0.0–100.0)

## 2018-05-23 MED ORDER — ASPIRIN EC 81 MG PO TBEC
81.0000 mg | DELAYED_RELEASE_TABLET | Freq: Every day | ORAL | Status: DC
  Administered 2018-05-24 – 2018-05-25 (×2): 81 mg via ORAL
  Filled 2018-05-23 (×2): qty 1

## 2018-05-23 MED ORDER — ONDANSETRON HCL 4 MG PO TABS: 4.0000 mg | ORAL_TABLET | Freq: Four times a day (QID) | ORAL | Status: DC | PRN

## 2018-05-23 MED ORDER — TACROLIMUS 1 MG PO CAPS: 4.0000 mg | ORAL_CAPSULE | Freq: Every day | ORAL | Status: DC

## 2018-05-23 MED ORDER — K PHOS MONO-SOD PHOS DI & MONO 155-852-130 MG PO TABS
1.0000 | ORAL_TABLET | Freq: Every day | ORAL | Status: DC
  Administered 2018-05-24 – 2018-05-25 (×2): 1 via ORAL
  Filled 2018-05-23 (×3): qty 1

## 2018-05-23 MED ORDER — CALCITRIOL 0.5 MCG PO CAPS
0.5000 ug | ORAL_CAPSULE | Freq: Every evening | ORAL | Status: DC
  Administered 2018-05-23 – 2018-05-25 (×3): 0.5 ug via ORAL
  Filled 2018-05-23 (×4): qty 1

## 2018-05-23 MED ORDER — FAMOTIDINE 20 MG PO TABS
10.0000 mg | ORAL_TABLET | Freq: Every day | ORAL | Status: DC
  Administered 2018-05-23 – 2018-05-25 (×3): 10 mg via ORAL
  Filled 2018-05-23 (×3): qty 1

## 2018-05-23 MED ORDER — TACROLIMUS 1 MG PO CAPS
3.0000 mg | ORAL_CAPSULE | Freq: Every day | ORAL | Status: DC
  Filled 2018-05-23: qty 3

## 2018-05-23 MED ORDER — SODIUM CHLORIDE 0.9 % IV BOLUS
500.0000 mL | Freq: Once | INTRAVENOUS | Status: AC
  Administered 2018-05-23: 500 mL via INTRAVENOUS

## 2018-05-23 MED ORDER — AMLODIPINE BESYLATE 10 MG PO TABS
10.0000 mg | ORAL_TABLET | Freq: Every day | ORAL | Status: DC
  Administered 2018-05-24 – 2018-05-25 (×2): 10 mg via ORAL
  Filled 2018-05-23 (×2): qty 1

## 2018-05-23 MED ORDER — PREDNISONE 20 MG PO TABS
40.0000 mg | ORAL_TABLET | Freq: Every day | ORAL | Status: DC
  Administered 2018-05-24 – 2018-05-25 (×2): 40 mg via ORAL
  Filled 2018-05-23 (×2): qty 2

## 2018-05-23 MED ORDER — TACROLIMUS 1 MG PO CAPS: 4.0000 mg | ORAL_CAPSULE | Freq: Two times a day (BID) | ORAL | Status: DC

## 2018-05-23 MED ORDER — OSELTAMIVIR PHOSPHATE 30 MG PO CAPS
30.0000 mg | ORAL_CAPSULE | Freq: Once | ORAL | Status: AC
  Administered 2018-05-23: 30 mg via ORAL
  Filled 2018-05-23: qty 1

## 2018-05-23 MED ORDER — AMLODIPINE BESYLATE 5 MG PO TABS: 5.0000 mg | ORAL_TABLET | Freq: Every day | ORAL | Status: DC

## 2018-05-23 MED ORDER — ALLOPURINOL 100 MG PO TABS
100.0000 mg | ORAL_TABLET | Freq: Every day | ORAL | Status: DC
  Administered 2018-05-24 – 2018-05-25 (×2): 100 mg via ORAL
  Filled 2018-05-23 (×2): qty 1

## 2018-05-23 MED ORDER — HYDROCODONE-ACETAMINOPHEN 5-325 MG PO TABS
1.0000 | ORAL_TABLET | Freq: Four times a day (QID) | ORAL | Status: DC | PRN
  Administered 2018-05-25 – 2018-05-26 (×2): 1 via ORAL
  Filled 2018-05-23 (×2): qty 1

## 2018-05-23 MED ORDER — MAGNESIUM OXIDE 400 (241.3 MG) MG PO TABS
400.0000 mg | ORAL_TABLET | Freq: Two times a day (BID) | ORAL | Status: DC
  Administered 2018-05-23 – 2018-05-25 (×5): 400 mg via ORAL
  Filled 2018-05-23 (×5): qty 1

## 2018-05-23 MED ORDER — ACETAMINOPHEN 325 MG PO TABS: 650.0000 mg | ORAL_TABLET | Freq: Four times a day (QID) | ORAL | Status: DC | PRN

## 2018-05-23 MED ORDER — ALBUTEROL SULFATE (2.5 MG/3ML) 0.083% IN NEBU
2.5000 mg | INHALATION_SOLUTION | Freq: Once | RESPIRATORY_TRACT | Status: AC
  Administered 2018-05-23: 2.5 mg via RESPIRATORY_TRACT
  Filled 2018-05-23: qty 3

## 2018-05-23 MED ORDER — IPRATROPIUM-ALBUTEROL 0.5-2.5 (3) MG/3ML IN SOLN
3.0000 mL | Freq: Once | RESPIRATORY_TRACT | Status: AC
  Administered 2018-05-23: 3 mL via RESPIRATORY_TRACT
  Filled 2018-05-23: qty 3

## 2018-05-23 MED ORDER — SODIUM CHLORIDE 0.9 % IV SOLN
Freq: Once | INTRAVENOUS | Status: AC
  Administered 2018-05-23: 18:00:00 via INTRAVENOUS

## 2018-05-23 MED ORDER — TACROLIMUS 1 MG PO CAPS
4.0000 mg | ORAL_CAPSULE | Freq: Two times a day (BID) | ORAL | Status: DC
  Administered 2018-05-23 – 2018-05-25 (×5): 4 mg via ORAL
  Filled 2018-05-23 (×6): qty 4

## 2018-05-23 MED ORDER — BENZONATATE 100 MG PO CAPS: 100.0000 mg | ORAL_CAPSULE | Freq: Three times a day (TID) | ORAL | Status: DC | PRN

## 2018-05-23 MED ORDER — SODIUM BICARBONATE 650 MG PO TABS
650.0000 mg | ORAL_TABLET | Freq: Three times a day (TID) | ORAL | Status: DC
  Administered 2018-05-23 – 2018-05-25 (×7): 650 mg via ORAL
  Filled 2018-05-23 (×7): qty 1

## 2018-05-23 MED ORDER — ENOXAPARIN SODIUM 40 MG/0.4ML ~~LOC~~ SOLN
40.0000 mg | SUBCUTANEOUS | Status: DC
  Administered 2018-05-23 – 2018-05-25 (×3): 40 mg via SUBCUTANEOUS
  Filled 2018-05-23 (×3): qty 0.4

## 2018-05-23 MED ORDER — MYCOPHENOLATE SODIUM 180 MG PO TBEC
360.0000 mg | DELAYED_RELEASE_TABLET | Freq: Two times a day (BID) | ORAL | Status: DC
  Administered 2018-05-23 – 2018-05-25 (×5): 360 mg via ORAL
  Filled 2018-05-23 (×6): qty 2

## 2018-05-23 MED ORDER — ACETAMINOPHEN 500 MG PO TABS
1000.0000 mg | ORAL_TABLET | Freq: Four times a day (QID) | ORAL | Status: DC | PRN
  Filled 2018-05-23: qty 2

## 2018-05-23 MED ORDER — SULFAMETHOXAZOLE-TRIMETHOPRIM 400-80 MG PO TABS
1.0000 | ORAL_TABLET | ORAL | Status: DC
  Filled 2018-05-23: qty 1

## 2018-05-23 MED ORDER — SODIUM CHLORIDE 0.9 % IV SOLN
INTRAVENOUS | Status: DC
  Administered 2018-05-23: 23:00:00 via INTRAVENOUS

## 2018-05-23 MED ORDER — PREDNISONE 50 MG PO TABS
60.0000 mg | ORAL_TABLET | Freq: Once | ORAL | Status: AC
  Administered 2018-05-23: 60 mg via ORAL
  Filled 2018-05-23: qty 1

## 2018-05-23 MED ORDER — OSELTAMIVIR PHOSPHATE 30 MG PO CAPS
30.0000 mg | ORAL_CAPSULE | Freq: Two times a day (BID) | ORAL | Status: DC
  Administered 2018-05-24 – 2018-05-25 (×4): 30 mg via ORAL
  Filled 2018-05-23 (×5): qty 1

## 2018-05-23 MED ORDER — SIMVASTATIN 10 MG PO TABS
20.0000 mg | ORAL_TABLET | Freq: Every evening | ORAL | Status: DC
  Administered 2018-05-23 – 2018-05-25 (×3): 20 mg via ORAL
  Filled 2018-05-23 (×3): qty 2

## 2018-05-23 MED ORDER — ONDANSETRON HCL 4 MG/2ML IJ SOLN: 4.0000 mg | Freq: Four times a day (QID) | INTRAMUSCULAR | Status: DC | PRN

## 2018-05-23 NOTE — H&P (Addendum)
History and Physical    [name] MOQ:[id number] DOB: 22-Feb-1941 DOA: 05/23/2018  PCP: Michael Mccreedy, MD  Patient coming from: Home  I have personally briefly reviewed patient's old medical records in [place]  Chief Complaint: SOB  HPI: [name] is a 78 y.o. male with medical history significant of kidney transplant in Feb 2013, on tacrolimus and mycophenolate, HTN, HLD.  Patient presented to the ED at [facility] with Cough, congestion, SOB, chills, denied fevers subjectively.  No h/o COPD or chronic lung disease.  No h/o CHF.  Symptoms severe and persistent.   ED Course: CXR neg, BNP 217, trop neg, creat 1.64 (baseline looks like its 1.7-1.8).  SBP 150s.  Tachycardic with HR in the 120s.  Started on empiric tamiflu.  Steroids and neb treatments in ED.  Transferred to [facility].    After arrival Tm 101.5.  Influenza A has come back positive.   Review of Systems: As per HPI otherwise 10 point review of systems negative.   Past Medical History:  Diagnosis Date  . Chronic kidney disease    HX OF KIDNEY TRANSPLANT 05/2011  . GERD (gastroesophageal reflux disease)   . Gout   . H/O kidney transplant   . Hypertension   . Trauma to eye, right 05/18/2013    Past Surgical History:  Procedure Laterality Date  . CHOLECYSTECTOMY    . KIDNEY TRANSPLANT       reports that he has never smoked. He has never used smokeless tobacco. He reports that he does not drink alcohol or use drugs.  No Known Allergies  Family History  Problem Relation Age of Onset  . Arthritis Mother   . Heart disease Mother      Prior to Admission medications   Medication Sig Start Date End Date Taking? Authorizing Provider  allopurinol (ZYLOPRIM) 100 MG tablet Take 100 mg by mouth daily.    [provider]  aspirin 81 MG chewable tablet Chew 81 mg by mouth daily.    [provider]  benzonatate (TESSALON PERLES) 100 MG capsule Take 1 capsule (100 mg total) by mouth 3  (three) times daily as needed for cough. 04/02/16   Mcneil, Ozella Almond, PA-C  HYDROcodone-acetaminophen (NORCO/VICODIN) 5-325 MG tablet Take 1-2 tablets by mouth every 6 (six) hours as needed for moderate pain. 01/14/16   Michael Sorrow, MD  mycophenolate (MYFORTIC) 180 MG EC tablet Take 360 mg by mouth 2 (two) times daily.    [provider]  ranitidine (ZANTAC) 75 MG tablet Take 75 mg by mouth daily.    [provider]  simvastatin (ZOCOR) 20 MG tablet Take 20 mg by mouth every evening.    [provider]  sodium bicarbonate 650 MG tablet Take 650 mg by mouth 3 (three) times daily.    [provider]  sulfamethoxazole-trimethoprim (BACTRIM,SEPTRA) 400-80 MG per tablet Take 1 tablet by mouth 3 (three) times a week. Monday, Wednesday and friday    [provider]  tacrolimus (PROGRAF) 1 MG capsule Take 3-4 mg by mouth 2 (two) times daily. 4mg  in the morning and 3 mg in the evening    [provider]    Physical Exam: Vitals:   05/23/18 1548 05/23/18 1730 05/23/18 1800 05/23/18 1919  BP:  (!) 146/92 (!) 155/91   Pulse:  (!) 121 (!) 120   Resp:  (!) 23 (!) 22   Temp: 98.9 F (37.2 C)   (!) 101.5 F (38.6 C)  TempSrc: Oral  Oral  SpO2:  100% 97%   Weight:      Height:        Constitutional: NAD, calm, comfortable Eyes: PERRL, lids and conjunctivae normal ENMT: Mucous membranes are moist. Posterior pharynx clear of any exudate or lesions.Normal dentition.  Neck: normal, supple, no masses, no thyromegaly Respiratory: B wheezing Cardiovascular: Regular rate and rhythm, no murmurs / rubs / gallops. No extremity edema. 2+ pedal pulses. No carotid bruits.  Abdomen: no tenderness, no masses palpated. No hepatosplenomegaly. Bowel sounds positive.  Musculoskeletal: no clubbing / cyanosis. No joint deformity upper and lower extremities. Good ROM, no contractures. Normal muscle tone.  Skin: no rashes, lesions, ulcers. No  induration Neurologic: CN 2-12 grossly intact. Sensation intact, DTR normal. Strength 5/5 in all 4.  Psychiatric: Normal judgment and insight. Alert and oriented x 3. Normal mood.    Labs on Admission: I have personally reviewed following labs and imaging studies  CBC: Recent Labs  Lab 05/23/18 1255  WBC 5.9  NEUTROABS 5.1  HGB 13.2  HCT 41.3  MCV 93.9  PLT 976*   Basic Metabolic Panel: Recent Labs  Lab 05/23/18 1255  NA 133*  K 4.2  CL 105  CO2 17*  GLUCOSE 145*  BUN 21  CREATININE 1.64*  CALCIUM 9.0   GFR: Estimated Creatinine Clearance: 37.7 mL/min (A) (by C-G formula based on SCr of 1.64 mg/dL (H)). Liver Function Tests: No results for input(s): AST, ALT, ALKPHOS, BILITOT, PROT, ALBUMIN in the last 168 hours. No results for input(s): LIPASE, AMYLASE in the last 168 hours. No results for input(s): AMMONIA in the last 168 hours. Coagulation Profile: No results for input(s): INR, PROTIME in the last 168 hours. Cardiac Enzymes: Recent Labs  Lab 05/23/18 1255  TROPONINI <0.03   BNP (last 3 results) No results for input(s): PROBNP in the last 8760 hours. HbA1C: No results for input(s): HGBA1C in the last 72 hours. CBG: No results for input(s): GLUCAP in the last 168 hours. Lipid Profile: No results for input(s): CHOL, HDL, LDLCALC, TRIG, CHOLHDL, LDLDIRECT in the last 72 hours. Thyroid Function Tests: No results for input(s): TSH, T4TOTAL, FREET4, T3FREE, THYROIDAB in the last 72 hours. Anemia Panel: No results for input(s): VITAMINB12, FOLATE, FERRITIN, TIBC, IRON, RETICCTPCT in the last 72 hours. Urine analysis:    Component Value Date/Time   COLORURINE YELLOW 05/23/2018 1727   APPEARANCEUR CLEAR 05/23/2018 1727   LABSPEC 1.015 05/23/2018 1727   PHURINE 7.5 05/23/2018 1727   GLUCOSEU 100 (A) 05/23/2018 1727   HGBUR TRACE (A) 05/23/2018 1727   BILIRUBINUR NEGATIVE 05/23/2018 1727   KETONESUR NEGATIVE 05/23/2018 1727   PROTEINUR NEGATIVE 05/23/2018  1727   NITRITE NEGATIVE 05/23/2018 1727   LEUKOCYTESUR NEGATIVE 05/23/2018 1727    Radiological Exams on Admission: Dg Chest 2 View  Result Date: 05/23/2018 CLINICAL DATA:  Cough. EXAM: CHEST - 2 VIEW COMPARISON:  Radiographs of April 02, 2016. FINDINGS: The heart size and mediastinal contours are within normal limits. Both lungs are clear. The visualized skeletal structures are unremarkable. IMPRESSION: No active cardiopulmonary disease. Electronically Signed   By: Marijo Conception, M.D.   On: 05/23/2018 10:23    EKG: Independently reviewed.  Assessment/Plan Principal Problem:   Influenza A Active Problems:   HTN (hypertension)   History of renal transplant   Acute respiratory distress    1. Influenza A - causing his respiratory distress on presentation 1. Tamiflu 2. Adult wheeze protocol 3. Breathing improved and unlabored at this point 4.  Will order prendisone 40mg  PO daily as it does seem he is on chronic prednisone 10mg  daily at home according to med rec. 5. Tylenol PRN fever 6. Tele monitor and cont pulse ox 7. IVF: NS at 100, but BP running on the high side already. 2. HTN - 1. Continue home norvasc in AM 2. Will hold off on ordering lisinopril, but if BP still running high by AM, may wish to re-order this as well 3. H/o kidney transplant - 1. Creat at baseline 2. Repeat CMP tomorrow AM to monitor 3. Continue mycophenolate and tacrolimus 4. Gout - continue allopurinol 5. Chronic back pain - norco PRN  DVT prophylaxis: Lovenox Code Status: Full Family Communication: No family in room Disposition Plan: Home after admit Consults called: None Admission status: Admit to inpatient  Severity of Illness: The appropriate patient status for this patient is INPATIENT. Inpatient status is judged to be reasonable and necessary in order to provide the required intensity of service to ensure the patient's safety. The patient's presenting symptoms, physical exam findings, and  initial radiographic and laboratory data in the context of their chronic comorbidities is felt to place them at high risk for further clinical deterioration. Furthermore, it is not anticipated that the patient will be medically stable for discharge from the hospital within 2 midnights of admission. The following factors support the patient status of inpatient.   " The patient's presenting symptoms include Respiratory distress. " The initial radiographic and laboratory data are worrisome because of Influenza A. " The chronic co-morbidities include immunosuppression due to h/o renal transplant.   * I certify that at the point of admission it is my clinical judgment that the patient will require inpatient hospital care spanning beyond 2 midnights from the point of admission due to high intensity of service, high risk for further deterioration and high frequency of surveillance required.*    ,  M. DO [facility]  How to contact the [facility] Attending or Consulting provider [place] or covering provider during after hours [place], for this patient?  1. Check the care team in [facility] and look for a) attending/consulting [facility] provider listed and b) the [facility] team listed 2. Log into [email or web address]  Amion Physician Scheduling and messaging for groups and whole hospitals  On call and physician scheduling software for group practices, residents, hospitalists and other medical providers for call, clinic, rotation and shift schedules. On[employer] is a hospital-wide system for scheduling doctors and paging doctors on call. EasyPlot is for scientific plotting and data analysis.  [email or web address]  and use 's universal password to access. If you do not have the password, please contact the hospital operator.  3. Locate the [facility] provider you are looking for under [facility] and page to a number that you can be directly reached. 4. If you still have difficulty reaching the provider,  please page the [facility] (Director on Call) for the Hospitalists listed on amion for assistance.  05/23/2018, 8:29 PM

## 2018-05-23 NOTE — ED Triage Notes (Signed)
C/o cough, congestion sob and body aches x 3 days

## 2018-05-23 NOTE — ED Provider Notes (Signed)
[facility] EMERGENCY DEPARTMENT Provider Note   CSN: [id number] Arrival date & time: 05/23/18  3300    History   Chief Complaint Chief Complaint  Patient presents with  . URI    HPI [name] is a 78 y.o. male.     78 year old male presents with complaint of cough, congestion, shortness of breath and wheezing x2 days.  Patient states cough is occasionally productive with white phlegm, states his chest feels sore with coughing, states he was up all night coughing last night which prompted his visit today.  Patient denies chest pain, fevers, chills, sweats, leg swelling (reports normal baseline left leg swelling due to knee pain).  No history of asthma or chronic lung disease, congestive heart failure, patient is a non-smoker.     Past Medical History:  Diagnosis Date  . Chronic kidney disease    HX OF KIDNEY TRANSPLANT 05/2011  . GERD (gastroesophageal reflux disease)   . Gout   . H/O kidney transplant   . Hypertension   . Trauma to eye, right 05/18/2013    Patient Active Problem List   Diagnosis Date Noted  . Acute respiratory distress 05/23/2018  . Fall 05/20/2013  . Traumatic subdural hematoma ([place]) 05/20/2013  . Gout 05/20/2013  . Hyperlipidemia 05/20/2013  . HTN (hypertension) 05/20/2013  . History of renal transplant 05/20/2013  . GERD (gastroesophageal reflux disease) 05/20/2013  . Acute alcohol intoxication ([place]) 05/20/2013  . Traumatic orbital hematoma 05/18/2013  . Subconjunctival hemorrhage of right eye 05/18/2013    Past Surgical History:  Procedure Laterality Date  . CHOLECYSTECTOMY    . KIDNEY TRANSPLANT          Home Medications    Prior to Admission medications   Medication Sig Start Date End Date Taking? Authorizing Provider  allopurinol (ZYLOPRIM) 100 MG tablet Take 100 mg by mouth daily.    [provider]  aspirin 81 MG chewable tablet Chew 81 mg by mouth daily.    [provider]  benzonatate  (TESSALON PERLES) 100 MG capsule Take 1 capsule (100 mg total) by mouth 3 (three) times daily as needed for cough. 04/02/16   Ward, Ozella Almond, PA-C  HYDROcodone-acetaminophen (NORCO) 10-325 MG per tablet Take 1 tablet by mouth every 6 (six) hours as needed for moderate pain.    [provider]  HYDROcodone-acetaminophen (NORCO/VICODIN) 5-325 MG tablet Take 1-2 tablets by mouth every 6 (six) hours as needed for moderate pain. 01/14/16   Fredia Sorrow, MD  mycophenolate (MYFORTIC) 180 MG EC tablet Take 360 mg by mouth 2 (two) times daily.    [provider]  ranitidine (ZANTAC) 75 MG tablet Take 75 mg by mouth daily.    [provider]  simvastatin (ZOCOR) 20 MG tablet Take 20 mg by mouth every evening.    [provider]  sodium bicarbonate 650 MG tablet Take 650 mg by mouth 3 (three) times daily.    [provider]  sulfamethoxazole-trimethoprim (BACTRIM,SEPTRA) 400-80 MG per tablet Take 1 tablet by mouth 3 (three) times a week. Monday, Wednesday and friday    [provider]  tacrolimus (PROGRAF) 1 MG capsule Take 3-4 mg by mouth 2 (two) times daily. 4mg  in the morning and 3 mg in the evening    [provider]    Family History No family history on file.  Social History Social History   Tobacco Use  . Smoking status: Never Smoker  . Smokeless tobacco: Never Used  Substance Use  Topics  . Alcohol use: No  . Drug use: No     Allergies   Patient has no known allergies.   Review of Systems Review of Systems  Constitutional: Negative for chills, diaphoresis and fever.  HENT: Positive for congestion. Negative for ear pain, sinus pressure, sinus pain, sneezing and sore throat.   Respiratory: Positive for cough, shortness of breath and wheezing. Negative for chest tightness.   Cardiovascular: Negative for chest pain and leg swelling.  Gastrointestinal: Negative for nausea and vomiting.  Musculoskeletal: Negative for  arthralgias and myalgias.  Skin: Negative for rash and wound.  Allergic/Immunologic: Positive for immunocompromised state.  Neurological: Positive for headaches.  Hematological: Negative for adenopathy.  Psychiatric/Behavioral: Negative for confusion.  All other systems reviewed and are negative.    Physical Exam Updated Vital Signs BP (!) 152/87   Pulse (!) 103   Temp 98.9 F (37.2 C) (Oral)   Resp (!) 24   Ht 5\' 5"  (1.651 m)   Wt 81.6 kg   SpO2 93%   BMI 29.95 kg/m   Physical Exam Vitals signs and nursing note reviewed.  Constitutional:      General: He is not in acute distress.    Appearance: He is well-developed. He is not diaphoretic.  HENT:     Head: Normocephalic and atraumatic.     Right Ear: Tympanic membrane and ear canal normal.     Left Ear: Tympanic membrane and ear canal normal.     Nose: Rhinorrhea present.     Mouth/Throat:     Mouth: Mucous membranes are moist.  Eyes:     Pupils: Pupils are equal, round, and reactive to light.  Neck:     Musculoskeletal: Neck supple.  Cardiovascular:     Rate and Rhythm: Normal rate. Rhythm irregular.     Pulses: Normal pulses.     Heart sounds: Normal heart sounds. No murmur.  Pulmonary:     Effort: Tachypnea present.     Breath sounds: Wheezing present.  Chest:     Chest wall: No tenderness.  Abdominal:     Tenderness: There is no abdominal tenderness.  Musculoskeletal:        General: No swelling, tenderness or deformity.     Right lower leg: Edema present.     Left lower leg: Edema present.     Comments: L>R lower extremity edema, reports baseline due to left knee pain  Skin:    General: Skin is warm and dry.     Findings: No erythema or rash.  Neurological:     Mental Status: He is alert and oriented to person, place, and time.  Psychiatric:        Behavior: Behavior normal.      ED Treatments / Results  Labs (all labs ordered are listed, but only abnormal results are displayed) Labs Reviewed    BASIC METABOLIC PANEL - Abnormal; Notable for the following components:      Result Value   Sodium 133 (*)    CO2 17 (*)    Glucose, Bld 145 (*)    Creatinine, Ser 1.64 (*)    GFR calc non Af Amer 40 (*)    GFR calc Af Amer 46 (*)    All other components within normal limits  CBC WITH DIFFERENTIAL/PLATELET - Abnormal; Notable for the following components:   Platelets 118 (*)    Lymphs Abs 0.3 (*)    All other components within normal limits  BRAIN NATRIURETIC PEPTIDE - Abnormal;  Notable for the following components:   B Natriuretic Peptide 217.7 (*)    All other components within normal limits  RESPIRATORY PANEL BY PCR  TROPONIN I  INFLUENZA PANEL BY PCR (TYPE A & B)    EKG EKG Interpretation  Date/Time:  Friday May 23 2018 14:49:43 EST Ventricular Rate:  124 PR Interval:    QRS Duration: 122 QT Interval:  339 QTC Calculation: 487 R Axis:   -3 Text Interpretation:  Sinus tachycardia Paired ventricular premature complexes Right bundle branch block Inferolateral infarct, age indeterminate No STEMI.  Confirmed by Nanda Quinton [phone]) on 05/23/2018 2:53:40 PM   Radiology Dg Chest 2 View  Result Date: 05/23/2018 CLINICAL DATA:  Cough. EXAM: CHEST - 2 VIEW COMPARISON:  Radiographs of April 02, 2016. FINDINGS: The heart size and mediastinal contours are within normal limits. Both lungs are clear. The visualized skeletal structures are unremarkable. IMPRESSION: No active cardiopulmonary disease. Electronically Signed   By: Marijo Conception, M.D.   On: 05/23/2018 10:23    Procedures Procedures (including critical care time)  Medications Ordered in ED Medications  oseltamivir (TAMIFLU) capsule 30 mg (has no administration in time range)  ipratropium-albuterol (DUONEB) 0.5-2.5 (3) MG/3ML nebulizer solution 3 mL (3 mLs Nebulization Given 05/23/18 1044)  predniSONE (DELTASONE) tablet 60 mg (60 mg Oral Given 05/23/18 1054)  albuterol (PROVENTIL) (2.5 MG/3ML) 0.083% nebulizer  solution 2.5 mg (2.5 mg Nebulization Given 05/23/18 1129)  sodium chloride 0.9 % bolus 500 mL (0 mLs Intravenous Stopped 05/23/18 1533)     Initial Impression / Assessment and Plan / ED Course  I have reviewed the triage vital signs and the nursing notes.  Pertinent labs & imaging results that were available during my care of the patient were reviewed by me and considered in my medical decision making (see chart for details).  Clinical Course as of May 24 1599  Fri May 23, 2018  1453 77yo male with history of kidney transplant presents with complaint of cough, congestion, shortness of breath and wheezing x 2 days, cough worse last night. States his chest feels sore from cough, no chest pain, no history of CHF. On exam, dyspenic with course lung sounds and wheezing. Given duo neb and prednisone. On recheck, denies any improvement, lung sounds slightly improved. CXR unremarkable. Given additional neb treatment without significant improvement. Case discussed with Dr. Laverta Baltimore, ER attending, EKG and labs ordered. Slight increase in cr compared to previous (2015, no recent labs available), BNP 217, CBC unremarkable. Troponin negative, EKG without acute ischemic changes. Patient feels very slight improvement however is more tachycardic, remains dyspneic. Recommend admission, patient prefers [facility], no beds available for transfer. Discussed admission to [facility], patient prefers to go home to rest, discussed risks and benefits of admission vs dc home, patient to talk to wife.   [LM]  1521 Patient is agreeable with plan for admission.   [LM]  1600 Case discussed with Dr. Grandville Silos with [facility] at [facility] patient accepted for transfer awaiting bed, recommends give dose of Tamiflu while awaiting flu panel.  Tamiflu ordered renally dosed.   [LM]    Clinical Course User Index [LM] Tacy Learn, PA-C   Final Clinical Impressions(s) / ED Diagnoses   Final diagnoses:  Acute upper  respiratory infection  Tachycardia  Shortness of breath    ED Discharge Orders    None       Roque Lias 05/23/18 1601    Long, Wonda Old[facility], MD  05/24/18 1253  

## 2018-05-23 NOTE — ED Notes (Signed)
Pt's wife is going home, [name] [phone] (c), [phone] (h)

## 2018-05-23 NOTE — Progress Notes (Signed)
Was called by ED PA Suella Broad, PA about patient who is a 78 year old gentleman history of kidney transplant February 2013, gastroesophageal reflux disease, hypertension presented to the ED with a day of productive cough, congestion, shortness of breath and diffuse wheezing.  Some chest soreness from coughing.  No fevers or chills.  No history of asthma or chronic lung disease.  No CHF.  Chest x-ray done negative for acute infiltrate.  Basic metabolic profile with a creatinine of 1.64, BNP of 217- troponin.  CBC unremarkable.  Influenza PCR pending.  Patient noted in the ED to be tachypneic with respiratory rates as high as 30, tachycardic with heart rates in the 130s and somewhat irregular.  Blood pressure stable with systolic in the 395V.  Patient given DuoNeb and prednisone however no significant clinical improvement.  Hospitalist called for admission for acute respiratory distress.  Advised that patient be started on Tamiflu while awaiting influenza PCR results.  Patient accepted to stepdown bed for now.  No charge.

## 2018-05-24 ENCOUNTER — Encounter (HOSPITAL_COMMUNITY): Payer: Self-pay

## 2018-05-24 ENCOUNTER — Other Ambulatory Visit: Payer: Self-pay

## 2018-05-24 LAB — COMPREHENSIVE METABOLIC PANEL
ALT: 11 U/L (ref 0–44)
ANION GAP: 5 (ref 5–15)
AST: 12 U/L — ABNORMAL LOW (ref 15–41)
Albumin: 3 g/dL — ABNORMAL LOW (ref 3.5–5.0)
Alkaline Phosphatase: 36 U/L — ABNORMAL LOW (ref 38–126)
BUN: 19 mg/dL (ref 8–23)
CO2: 19 mmol/L — ABNORMAL LOW (ref 22–32)
Calcium: 8.4 mg/dL — ABNORMAL LOW (ref 8.9–10.3)
Chloride: 109 mmol/L (ref 98–111)
Creatinine, Ser: 1.44 mg/dL — ABNORMAL HIGH (ref 0.61–1.24)
GFR calc Af Amer: 54 mL/min — ABNORMAL LOW (ref >60)
GFR calc non Af Amer: 47 mL/min — ABNORMAL LOW (ref >60)
Glucose, Bld: 112 mg/dL — ABNORMAL HIGH (ref 70–99)
POTASSIUM: 5 mmol/L (ref 3.5–5.1)
Sodium: 133 mmol/L — ABNORMAL LOW (ref 135–145)
Total Bilirubin: 0.3 mg/dL (ref 0.3–1.2)
Total Protein: 5 g/dL — ABNORMAL LOW (ref 6.5–8.1)

## 2018-05-24 LAB — CBC
HCT: 34.5 % — ABNORMAL LOW (ref 39.0–52.0)
Hemoglobin: 11.1 g/dL — ABNORMAL LOW (ref 13.0–17.0)
MCH: 30.7 pg (ref 26.0–34.0)
MCHC: 32.2 g/dL (ref 30.0–36.0)
MCV: 95.3 fL (ref 80.0–100.0)
PLATELETS: 120 10*3/uL — AB (ref 150–400)
RBC: 3.62 MIL/uL — ABNORMAL LOW (ref 4.22–5.81)
RDW: 13.3 % (ref 11.5–15.5)
WBC: 7 10*3/uL (ref 4.0–10.5)
nRBC: 0 % (ref 0.0–0.2)

## 2018-05-24 MED ORDER — SODIUM BICARBONATE 650 MG PO TABS: 650.0000 mg | ORAL_TABLET | Freq: Three times a day (TID) | ORAL | Status: DC

## 2018-05-24 MED ORDER — LISINOPRIL 20 MG PO TABS
20.0000 mg | ORAL_TABLET | Freq: Every day | ORAL | Status: DC
  Administered 2018-05-24: 20 mg via ORAL
  Filled 2018-05-24: qty 2

## 2018-05-24 MED ORDER — DM-GUAIFENESIN ER 30-600 MG PO TB12
1.0000 | ORAL_TABLET | Freq: Two times a day (BID) | ORAL | Status: DC | PRN
  Administered 2018-05-24 – 2018-05-25 (×3): 1 via ORAL
  Filled 2018-05-24 (×3): qty 1

## 2018-05-24 NOTE — Progress Notes (Signed)
Patient to transfer to 1510 report given to receiving nurse Kerry Dory, all questions answered at this time.  Pt. Stable for transfer.

## 2018-05-24 NOTE — Progress Notes (Signed)
PROGRESS NOTE    [name]  DJS:[id number] DOB: 14-Dec-1940 DOA: 05/23/2018 PCP: Benito Mccreedy, MD     Brief Narrative:  [name] is a 78 year old male with past medical history significant of kidney transplant in February 2013, on tacrolimus and mycophenolate, hypertension, hyperlipidemia.  He presents with chief complaint of cough, congestion, shortness of breath, chills.  Work-up in the emergency department revealed influenza and sepsis.  New events last 24 hours / Subjective: Patient states that he feels better since admission.  Continues to have shortness of breath with exertion, diarrhea.  He denies any further fevers or chills, no nausea, vomiting or abdominal pain.  He is asking for medication to help with his cough.  Assessment & Plan:   Principal Problem:   Influenza A Active Problems:   HTN (hypertension)   History of renal transplant   Acute respiratory distress   Sepsis secondary to influenza A Presented with fever T-max 101.5, tachycardia, tachypnea Continue Tamiflu Prednisone at an increased dose IV fluids stopped as patient able to tolerate p.o.  Essential hypertension Continue Norvasc, lisinopril  History of kidney transplant with CKD stage III Baseline creatinine 1.3 in 2015 Creatinine stable this morning 1.44 Continue mycophenolate, tacrolimus, Bactrim, prednisone as above  Chronic back pain Continue Norco as needed  Hyperlipidemia Continue Zocor   DVT prophylaxis: Lovenox Code Status: Full code Family Communication: No family at bedside Disposition Plan: Pending further improvement in his respiratory status   Consultants:   None  Procedures:   None  Antimicrobials:  Anti-infectives (From admission, onward)   Start     Dose/Rate Route Frequency Ordered Stop   05/26/18 0900  sulfamethoxazole-trimethoprim (BACTRIM,SEPTRA) 400-80 MG per tablet 1 tablet    Note to Pharmacy:  Monday, Wednesday and friday     1 tablet Oral Once  per day on Mon Wed Fri 05/23/18 2022     05/24/18 1000  oseltamivir (TAMIFLU) capsule 30 mg     30 mg Oral 2 times daily 05/23/18 1931 05/29/18 0959   05/23/18 1615  oseltamivir (TAMIFLU) capsule 30 mg     30 mg Oral  Once 05/23/18 1600 05/23/18 1627        Objective: Vitals:   05/24/18 0800 05/24/18 0833 05/24/18 0900 05/24/18 1000  BP: (!) 154/97  (!) 155/89 (!) 151/91  Pulse: 98  94 (!) 111  Resp: (!) 23  19 19   Temp:  98.3 F (36.8 C)    TempSrc:  Oral    SpO2: 97%  100% 98%  Weight:      Height:        Intake/Output Summary (Last 24 hours) at 05/24/2018 1038 Last data filed at 05/24/2018 0800 Gross per 24 hour  Intake 1201.41 ml  Output 350 ml  Net 851.41 ml   Filed Weights   05/23/18 0939 05/24/18 0300  Weight: 81.6 kg 80.7 kg    Examination:  General exam: Appears calm and comfortable  Respiratory system: Expiratory wheezes bilaterally Cardiovascular system: S1 & S2 heard, RRR. No JVD, murmurs, rubs, gallops or clicks. No pedal edema. Gastrointestinal system: Abdomen is nondistended, soft and nontender. No organomegaly or masses felt. Normal bowel sounds heard. Central nervous system: Alert and oriented. No focal neurological deficits. Extremities: Symmetric 5 x 5 power. Skin: No rashes, lesions or ulcers Psychiatry: Judgement and insight appear normal. Mood & affect appropriate.   Data Reviewed: I have personally reviewed following labs and imaging studies  CBC: Recent Labs  Lab 05/23/18 1255 05/24/18 0323  WBC  5.9 7.0  NEUTROABS 5.1  --   HGB 13.2 11.1*  HCT 41.3 34.5*  MCV 93.9 95.3  PLT 118* 496*   Basic Metabolic Panel: Recent Labs  Lab 05/23/18 1255 05/24/18 0323  NA 133* 133*  K 4.2 5.0  CL 105 109  CO2 17* 19*  GLUCOSE 145* 112*  BUN 21 19  CREATININE 1.64* 1.44*  CALCIUM 9.0 8.4*   GFR: Estimated Creatinine Clearance: 42.7 mL/min (A) (by C-G formula based on SCr of 1.44 mg/dL (H)). Liver Function Tests: Recent Labs  Lab  05/24/18 0323  AST 12*  ALT 11  ALKPHOS 36*  BILITOT 0.3  PROT 5.0*  ALBUMIN 3.0*   No results for input(s): LIPASE, AMYLASE in the last 168 hours. No results for input(s): AMMONIA in the last 168 hours. Coagulation Profile: No results for input(s): INR, PROTIME in the last 168 hours. Cardiac Enzymes: Recent Labs  Lab 05/23/18 1255  TROPONINI <0.03   BNP (last 3 results) No results for input(s): PROBNP in the last 8760 hours. HbA1C: No results for input(s): HGBA1C in the last 72 hours. CBG: No results for input(s): GLUCAP in the last 168 hours. Lipid Profile: No results for input(s): CHOL, HDL, LDLCALC, TRIG, CHOLHDL, LDLDIRECT in the last 72 hours. Thyroid Function Tests: No results for input(s): TSH, T4TOTAL, FREET4, T3FREE, THYROIDAB in the last 72 hours. Anemia Panel: No results for input(s): VITAMINB12, FOLATE, FERRITIN, TIBC, IRON, RETICCTPCT in the last 72 hours. Sepsis Labs: No results for input(s): PROCALCITON, LATICACIDVEN in the last 168 hours.  Recent Results (from the past 240 hour(s))  Respiratory Panel by PCR     Status: Abnormal   Collection Time: 05/23/18  3:34 PM  Result Value Ref Range Status   Adenovirus NOT DETECTED NOT DETECTED Final   Coronavirus 229E NOT DETECTED NOT DETECTED Final    Comment: (NOTE) The Coronavirus on the Respiratory Panel, DOES NOT test for the novel  Coronavirus (2019 nCoV)    Coronavirus HKU1 NOT DETECTED NOT DETECTED Final   Coronavirus NL63 NOT DETECTED NOT DETECTED Final   Coronavirus OC43 NOT DETECTED NOT DETECTED Final   Metapneumovirus NOT DETECTED NOT DETECTED Final   Rhinovirus / Enterovirus NOT DETECTED NOT DETECTED Final   Influenza A H1 2009 DETECTED (A) NOT DETECTED Final   Influenza B NOT DETECTED NOT DETECTED Final   Parainfluenza Virus 1 NOT DETECTED NOT DETECTED Final   Parainfluenza Virus 2 NOT DETECTED NOT DETECTED Final   Parainfluenza Virus 3 NOT DETECTED NOT DETECTED Final   Parainfluenza Virus 4  NOT DETECTED NOT DETECTED Final   Respiratory Syncytial Virus NOT DETECTED NOT DETECTED Final   Bordetella pertussis NOT DETECTED NOT DETECTED Final   Chlamydophila pneumoniae NOT DETECTED NOT DETECTED Final   Mycoplasma pneumoniae NOT DETECTED NOT DETECTED Final    Comment: Performed at [facility], [place]. [address] [place], [place] [address]  MRSA PCR Screening     Status: None   Collection Time: 05/23/18  7:41 PM  Result Value Ref Range Status   MRSA by PCR NEGATIVE NEGATIVE Final    Comment:        The GeneXpert MRSA Assay (FDA approved for NASAL specimens only), is one component of a comprehensive MRSA colonization surveillance program. It is not intended to diagnose MRSA infection nor to guide or monitor treatment for MRSA infections. Performed at [facility], [place] [address] [place], [place] [address]        Radiology Studies: Dg  Chest 2 View  Result Date: 05/23/2018 CLINICAL DATA:  Cough. EXAM: CHEST - 2 VIEW COMPARISON:  Radiographs of April 02, 2016. FINDINGS: The heart size and mediastinal contours are within normal limits. Both lungs are clear. The visualized skeletal structures are unremarkable. IMPRESSION: No active cardiopulmonary disease. Electronically Signed   By: Marijo Conception, M.D.   On: 05/23/2018 10:23      Scheduled Meds: . allopurinol  100 mg Oral Daily  . amLODipine  10 mg Oral Daily  . aspirin EC  81 mg Oral Daily  . calcitRIOL  0.5 mcg Oral QPM  . enoxaparin (LOVENOX) injection  40 mg Subcutaneous Q24H  . famotidine  10 mg Oral Daily  . magnesium oxide  400 mg Oral BID  . mycophenolate  360 mg Oral BID  . oseltamivir  30 mg Oral BID  . phosphorus  1 tablet Oral Daily  . predniSONE  40 mg Oral Q breakfast  . simvastatin  20 mg Oral QPM  . sodium bicarbonate  650 mg Oral TID  . [START ON 05/26/2018] sulfamethoxazole-trimethoprim  1 tablet Oral Once per day on Mon Wed Fri  . tacrolimus  4 mg Oral BID   Continuous  Infusions:   LOS: 1 day    Time spent: 35 minutes   Dessa Phi, DO [facility] [email or web address] 05/24/2018, 10:38 AM

## 2018-05-24 NOTE — Progress Notes (Signed)
Update: had been on 100cc NS / hr over night.  Vitals looking much better (tachycardia resolved, probably was just related to fever).  Patient asking to eat.  Will put on diet and DC IVF.

## 2018-05-25 ENCOUNTER — Inpatient Hospital Stay (HOSPITAL_COMMUNITY): Payer: Medicare Other

## 2018-05-25 LAB — BASIC METABOLIC PANEL
Anion gap: 8 (ref 5–15)
BUN: 31 mg/dL — AB (ref 8–23)
CO2: 18 mmol/L — ABNORMAL LOW (ref 22–32)
Calcium: 9.2 mg/dL (ref 8.9–10.3)
Chloride: 107 mmol/L (ref 98–111)
Creatinine, Ser: 1.8 mg/dL — ABNORMAL HIGH (ref 0.61–1.24)
GFR calc Af Amer: 41 mL/min — ABNORMAL LOW (ref >60)
GFR calc non Af Amer: 36 mL/min — ABNORMAL LOW (ref >60)
Glucose, Bld: 88 mg/dL (ref 70–99)
Potassium: 4.2 mmol/L (ref 3.5–5.1)
SODIUM: 133 mmol/L — AB (ref 135–145)

## 2018-05-25 LAB — CBC
HCT: 37.8 % — ABNORMAL LOW (ref 39.0–52.0)
Hemoglobin: 12 g/dL — ABNORMAL LOW (ref 13.0–17.0)
MCH: 30.2 pg (ref 26.0–34.0)
MCHC: 31.7 g/dL (ref 30.0–36.0)
MCV: 95.2 fL (ref 80.0–100.0)
Platelets: 146 10*3/uL — ABNORMAL LOW (ref 150–400)
RBC: 3.97 MIL/uL — ABNORMAL LOW (ref 4.22–5.81)
RDW: 13.4 % (ref 11.5–15.5)
WBC: 5.4 10*3/uL (ref 4.0–10.5)
nRBC: 0 % (ref 0.0–0.2)

## 2018-05-25 LAB — CREATININE, URINE, RANDOM: Creatinine, Urine: 80.56 mg/dL

## 2018-05-25 LAB — SODIUM, URINE, RANDOM: Sodium, Ur: 144 mmol/L

## 2018-05-25 MED ORDER — SODIUM CHLORIDE 0.9 % IV SOLN
INTRAVENOUS | Status: DC
  Administered 2018-05-25 – 2018-05-26 (×3): via INTRAVENOUS

## 2018-05-25 NOTE — Progress Notes (Signed)
PROGRESS NOTE    Gifford Ballon  NWG:[id number] DOB: [birth year] DOA: 05/23/2018 PCP: Benito Mccreedy, MD     Brief Narrative:  [name] is a 78 year old male with past medical history significant of kidney transplant in February 2013, on tacrolimus and mycophenolate, hypertension, hyperlipidemia.  He presents with chief complaint of cough, congestion, shortness of breath, chills.  Work-up in the emergency department revealed influenza and sepsis.  New events last 24 hours / Subjective: Cough has improved, still has some shortness of breath with exertion.   Assessment & Plan:   Principal Problem:   Influenza A Active Problems:   HTN (hypertension)   History of renal transplant   Acute respiratory distress   Sepsis secondary to influenza A Presented with fever T-max 101.5, tachycardia, tachypnea Continue Tamiflu Prednisone at an increased dose  Essential hypertension Continue Norvasc Hold lisinopril due to worsening kidney function  AKI on CKD stage 3, hx of kidney transplant Baseline creatinine 1.3 in 2015 Creatinine trended up to 1.8 this morning, stop lisinopril, IV fluids started today.  Obtain renal ultrasound Continue mycophenolate, tacrolimus, Bactrim, prednisone as above  Chronic back pain Continue Norco as needed  Hyperlipidemia Continue Zocor   DVT prophylaxis: Lovenox Code Status: Full code Family Communication: No family at bedside Disposition Plan: Pending further improvement in his respiratory status, renal function   Consultants:   None  Procedures:   None  Antimicrobials:  Anti-infectives (From admission, onward)   Start     Dose/Rate Route Frequency Ordered Stop   05/26/18 0900  sulfamethoxazole-trimethoprim (BACTRIM,SEPTRA) 400-80 MG per tablet 1 tablet    Note to Pharmacy:  Monday, Wednesday and friday     1 tablet Oral Once per day on Mon Wed Fri 05/23/18 2022     05/24/18 1000  oseltamivir (TAMIFLU) capsule 30 mg     30 mg  Oral 2 times daily 05/23/18 1931 05/29/18 0959   05/23/18 1615  oseltamivir (TAMIFLU) capsule 30 mg     30 mg Oral  Once 05/23/18 1600 05/23/18 1627       Objective: Vitals:   05/24/18 1700 05/24/18 1800 05/24/18 2011 05/25/18 0546  BP: (!) 110/93  122/82 121/82  Pulse: 95  98 81  Resp: 20  19 18   Temp: (!) 97.5 F (36.4 C) 98 F (36.7 C) 97.7 F (36.5 C) 97.7 F (36.5 C)  TempSrc: Oral Oral Oral Oral  SpO2: 100%  100% 100%  Weight:      Height:        Intake/Output Summary (Last 24 hours) at 05/25/2018 1128 Last data filed at 05/25/2018 0546 Gross per 24 hour  Intake -  Output 475 ml  Net -475 ml   Filed Weights   05/23/18 0939 05/24/18 0300  Weight: 81.6 kg 80.7 kg    Examination: General exam: Appears calm and comfortable  Respiratory system: Clear to auscultation. Respiratory effort normal. Cardiovascular system: S1 & S2 heard, RRR. No JVD, murmurs, rubs, gallops or clicks. No pedal edema. Gastrointestinal system: Abdomen is nondistended, soft and nontender. No organomegaly or masses felt. Normal bowel sounds heard. Central nervous system: Alert and oriented. No focal neurological deficits. Extremities: Symmetric 5 x 5 power. Skin: No rashes, lesions or ulcers Psychiatry: Judgement and insight appear normal. Mood & affect appropriate.    Data Reviewed: I have personally reviewed following labs and imaging studies  CBC: Recent Labs  Lab 05/23/18 1255 05/24/18 0323 05/25/18 0540  WBC 5.9 7.0 5.4  NEUTROABS 5.1  --   --  HGB 13.2 11.1* 12.0*  HCT 41.3 34.5* 37.8*  MCV 93.9 95.3 95.2  PLT 118* 120* 144*   Basic Metabolic Panel: Recent Labs  Lab 05/23/18 1255 05/24/18 0323 05/25/18 0540  NA 133* 133* 133*  K 4.2 5.0 4.2  CL 105 109 107  CO2 17* 19* 18*  GLUCOSE 145* 112* 88  BUN 21 19 31*  CREATININE 1.64* 1.44* 1.80*  CALCIUM 9.0 8.4* 9.2   GFR: Estimated Creatinine Clearance: 34.2 mL/min (A) (by C-G formula based on SCr of 1.8 mg/dL  (H)). Liver Function Tests: Recent Labs  Lab 05/24/18 0323  AST 12*  ALT 11  ALKPHOS 36*  BILITOT 0.3  PROT 5.0*  ALBUMIN 3.0*   No results for input(s): LIPASE, AMYLASE in the last 168 hours. No results for input(s): AMMONIA in the last 168 hours. Coagulation Profile: No results for input(s): INR, PROTIME in the last 168 hours. Cardiac Enzymes: Recent Labs  Lab 05/23/18 1255  TROPONINI <0.03   BNP (last 3 results) No results for input(s): PROBNP in the last 8760 hours. HbA1C: No results for input(s): HGBA1C in the last 72 hours. CBG: No results for input(s): GLUCAP in the last 168 hours. Lipid Profile: No results for input(s): CHOL, HDL, LDLCALC, TRIG, CHOLHDL, LDLDIRECT in the last 72 hours. Thyroid Function Tests: No results for input(s): TSH, T4TOTAL, FREET4, T3FREE, THYROIDAB in the last 72 hours. Anemia Panel: No results for input(s): VITAMINB12, FOLATE, FERRITIN, TIBC, IRON, RETICCTPCT in the last 72 hours. Sepsis Labs: No results for input(s): PROCALCITON, LATICACIDVEN in the last 168 hours.  Recent Results (from the past 240 hour(s))  Respiratory Panel by PCR     Status: Abnormal   Collection Time: 05/23/18  3:34 PM  Result Value Ref Range Status   Adenovirus NOT DETECTED NOT DETECTED Final   Coronavirus 229E NOT DETECTED NOT DETECTED Final    Comment: (NOTE) The Coronavirus on the Respiratory Panel, DOES NOT test for the novel  Coronavirus (2019 nCoV)    Coronavirus HKU1 NOT DETECTED NOT DETECTED Final   Coronavirus NL63 NOT DETECTED NOT DETECTED Final   Coronavirus OC43 NOT DETECTED NOT DETECTED Final   Metapneumovirus NOT DETECTED NOT DETECTED Final   Rhinovirus / Enterovirus NOT DETECTED NOT DETECTED Final   Influenza A H1 2009 DETECTED (A) NOT DETECTED Final   Influenza B NOT DETECTED NOT DETECTED Final   Parainfluenza Virus 1 NOT DETECTED NOT DETECTED Final   Parainfluenza Virus 2 NOT DETECTED NOT DETECTED Final   Parainfluenza Virus 3 NOT  DETECTED NOT DETECTED Final   Parainfluenza Virus 4 NOT DETECTED NOT DETECTED Final   Respiratory Syncytial Virus NOT DETECTED NOT DETECTED Final   Bordetella pertussis NOT DETECTED NOT DETECTED Final   Chlamydophila pneumoniae NOT DETECTED NOT DETECTED Final   Mycoplasma pneumoniae NOT DETECTED NOT DETECTED Final    Comment: Performed at [facility], [place]. [address], [place], [place] [address]  MRSA PCR Screening     Status: None   Collection Time: 05/23/18  7:41 PM  Result Value Ref Range Status   MRSA by PCR NEGATIVE NEGATIVE Final    Comment:        The GeneXpert MRSA Assay (FDA approved for NASAL specimens only), is one component of a comprehensive MRSA colonization surveillance program. It is not intended to diagnose MRSA infection nor to guide or monitor treatment for MRSA infections. Performed at [facility], [place] [address], [place], [place] [address]  Radiology Studies: No results found.    Scheduled Meds: . allopurinol  100 mg Oral Daily  . amLODipine  10 mg Oral Daily  . aspirin EC  81 mg Oral Daily  . calcitRIOL  0.5 mcg Oral QPM  . enoxaparin (LOVENOX) injection  40 mg Subcutaneous Q24H  . famotidine  10 mg Oral Daily  . magnesium oxide  400 mg Oral BID  . mycophenolate  360 mg Oral BID  . oseltamivir  30 mg Oral BID  . phosphorus  1 tablet Oral Daily  . predniSONE  40 mg Oral Q breakfast  . simvastatin  20 mg Oral QPM  . sodium bicarbonate  650 mg Oral TID  . [START ON 05/26/2018] sulfamethoxazole-trimethoprim  1 tablet Oral Once per day on Mon Wed Fri  . tacrolimus  4 mg Oral BID   Continuous Infusions: . sodium chloride 100 mL/hr at 05/25/18 0806     LOS: 2 days    Time spent: 25 minutes   Dessa Phi, DO [facility] [email or web address] 05/25/2018, 11:28 AM

## 2018-05-26 DIAGNOSIS — I10 Essential (primary) hypertension: Secondary | ICD-10-CM

## 2018-05-26 DIAGNOSIS — Z940 Kidney transplant status: Secondary | ICD-10-CM

## 2018-05-26 DIAGNOSIS — N179 Acute kidney failure, unspecified: Secondary | ICD-10-CM

## 2018-05-26 DIAGNOSIS — J101 Influenza due to other identified influenza virus with other respiratory manifestations: Secondary | ICD-10-CM

## 2018-05-26 LAB — CBC
HCT: 36.1 % — ABNORMAL LOW (ref 39.0–52.0)
Hemoglobin: 11.3 g/dL — ABNORMAL LOW (ref 13.0–17.0)
MCH: 30 pg (ref 26.0–34.0)
MCHC: 31.3 g/dL (ref 30.0–36.0)
MCV: 95.8 fL (ref 80.0–100.0)
Platelets: 146 10*3/uL — ABNORMAL LOW (ref 150–400)
RBC: 3.77 MIL/uL — ABNORMAL LOW (ref 4.22–5.81)
RDW: 13.2 % (ref 11.5–15.5)
WBC: 5.5 10*3/uL (ref 4.0–10.5)
nRBC: 0 % (ref 0.0–0.2)

## 2018-05-26 LAB — BASIC METABOLIC PANEL
Anion gap: 4 — ABNORMAL LOW (ref 5–15)
BUN: 31 mg/dL — ABNORMAL HIGH (ref 8–23)
CO2: 22 mmol/L (ref 22–32)
Calcium: 9.1 mg/dL (ref 8.9–10.3)
Chloride: 108 mmol/L (ref 98–111)
Creatinine, Ser: 1.63 mg/dL — ABNORMAL HIGH (ref 0.61–1.24)
GFR calc Af Amer: 47 mL/min — ABNORMAL LOW (ref >60)
GFR, EST NON AFRICAN AMERICAN: 40 mL/min — AB (ref >60)
Glucose, Bld: 96 mg/dL (ref 70–99)
Potassium: 4.6 mmol/L (ref 3.5–5.1)
Sodium: 134 mmol/L — ABNORMAL LOW (ref 135–145)

## 2018-05-26 MED ORDER — OSELTAMIVIR PHOSPHATE 30 MG PO CAPS: 30.0000 mg | ORAL_CAPSULE | Freq: Two times a day (BID) | ORAL | 0 refills | Status: AC

## 2018-05-26 MED ORDER — DM-GUAIFENESIN ER 30-600 MG PO TB12: 1.0000 | ORAL_TABLET | Freq: Two times a day (BID) | ORAL | 0 refills | Status: AC | PRN

## 2018-05-26 MED ORDER — ONDANSETRON HCL 4 MG PO TABS: 4.0000 mg | ORAL_TABLET | Freq: Four times a day (QID) | ORAL | 0 refills | Status: AC | PRN

## 2018-05-26 NOTE — Discharge Summary (Signed)
Physician Discharge Summary  [name] DJS:[id number] DOB: Jan 31, 1941 DOA: 05/23/2018  PCP: Benito Mccreedy, MD  Admit date: 05/23/2018 Discharge date: 05/26/2018  Admitted From: Home Disposition:  Home  Recommendations for Outpatient Follow-up:  1. Follow up with PCP in 1week with repeat CBC/BMP 2. Outpatient follow-up with transplant physicians 3. Follow-up in the ED if symptoms worsen or new appear   Home Health: No Equipment/Devices: None  Discharge Condition: Stable CODE STATUS: Full Diet recommendation: Heart Healthy   Brief/Interim Summary: 78 year old male with history of kidney transplant in February 2013 on tacrolimus and mycophenolate, hypertension, hyperlipidemia presented with chills, cough and shortness of breath.  He was found to have influenza and sepsis.  He was started on Tamiflu and prednisone.  He was also given some IV fluids for acute kidney injury, lisinopril was held.  He feels better.  He will be discharged home on 3 more days of Tamiflu.  Lisinopril will remain on hold.  Discharge Diagnoses:  Principal Problem:   Influenza A Active Problems:   HTN (hypertension)   History of renal transplant   Acute respiratory distress  Sepsis secondary to influenza A: Present on admission  Treated with Tamiflu and prednisone 40 mg daily.  Hemodynamically improved.  Sepsis has resolved.  Currently not spiking temperatures.  Discharge home on Tamiflu for 3 more days.  Patient will switch back to taking prednisone 10 mg daily, home dose.  Influenza A bronchitis  -Tamiflu plan as above  Essential hypertension Continue Norvasc Hold lisinopril because of renal function.  Outpatient follow-up  AKI on CKD stage 3, hx of kidney transplant Baseline creatinine 1.3 in 2015 Creatinine trended up to 1.8 on 05/25/2018.  Lisinopril was stopped.  Given IV fluids.  Creatinine is 1.63 today.  Patient is making urine.  Renal ultrasound was unremarkable. Continue  mycophenolate, tacrolimus, Bactrim, prednisone as above -Hold lisinopril.  Repeat BMP within a week and follow-up with PCP.  Outpatient follow-up with transplant physicians  Chronic back pain Continue Norco as needed  Hyperlipidemia Continue Zocor  Discharge Instructions  Discharge Instructions    Call MD for:  difficulty breathing, headache or visual disturbances   Complete by:  As directed    Call MD for:  extreme fatigue   Complete by:  As directed    Call MD for:  persistant nausea and vomiting   Complete by:  As directed    Call MD for:  temperature >100.4   Complete by:  As directed    Diet - low sodium heart healthy   Complete by:  As directed    Increase activity slowly   Complete by:  As directed      Allergies as of 05/26/2018   No Known Allergies     Medication List    STOP taking these medications   lisinopril 20 MG tablet Commonly known as:  PRINIVIL,ZESTRIL     TAKE these medications   allopurinol 100 MG tablet Commonly known as:  ZYLOPRIM Take 100 mg by mouth daily.   amLODipine 10 MG tablet Commonly known as:  NORVASC Take 10 mg by mouth daily.   aspirin 81 MG chewable tablet Chew 81 mg by mouth daily.   calcitRIOL 0.5 MCG capsule Commonly known as:  ROCALTROL Take 0.5 mcg by mouth every evening.   dextromethorphan-guaiFENesin 30-600 MG 12hr tablet Commonly known as:  MUCINEX DM Take 1 tablet by mouth 2 (two) times daily as needed for cough.   HYDROcodone-acetaminophen 5-325 MG tablet Commonly known as:  NORCO/VICODIN  Take 1-2 tablets by mouth every 6 (six) hours as needed for moderate pain.   magnesium oxide 400 MG tablet Commonly known as:  MAG-OX Take 400 mg by mouth 2 (two) times daily.   mycophenolate 180 MG EC tablet Commonly known as:  MYFORTIC Take 360 mg by mouth 2 (two) times daily.   ondansetron 4 MG tablet Commonly known as:  ZOFRAN Take 1 tablet (4 mg total) by mouth every 6 (six) hours as needed for nausea.    oseltamivir 30 MG capsule Commonly known as:  TAMIFLU Take 1 capsule (30 mg total) by mouth 2 (two) times daily for 3 days.   PHOSPHA 250 NEUTRAL 155-852-130 MG Tabs Take 1 tablet by mouth daily.   predniSONE 10 MG tablet Commonly known as:  DELTASONE Take 10 mg by mouth daily with breakfast.   ranitidine 75 MG tablet Commonly known as:  ZANTAC Take 75 mg by mouth daily.   simvastatin 20 MG tablet Commonly known as:  ZOCOR Take 20 mg by mouth every evening.   sodium bicarbonate 650 MG tablet Take 650 mg by mouth 3 (three) times daily.   sulfamethoxazole-trimethoprim 400-80 MG tablet Commonly known as:  BACTRIM,SEPTRA Take 1 tablet by mouth 3 (three) times a week. Monday, Wednesday and Friday   tacrolimus 1 MG capsule Commonly known as:  PROGRAF Take 4 mg by mouth 2 (two) times daily.      Follow-up Information    Osei-Bonsu, Iona Beard, MD. Schedule an appointment as soon as possible for a visit in 1 week(s).   Specialty:  Internal Medicine Why:  With repeat CBC/BMP Contact information: [address] [place] [address] [phone]          No Known Allergies  Consultations:  None   Procedures/Studies: Dg Chest 2 View  Result Date: 05/23/2018 CLINICAL DATA:  Cough. EXAM: CHEST - 2 VIEW COMPARISON:  Radiographs of April 02, 2016. FINDINGS: The heart size and mediastinal contours are within normal limits. Both lungs are clear. The visualized skeletal structures are unremarkable. IMPRESSION: No active cardiopulmonary disease. Electronically Signed   By: Marijo Conception, M.D.   On: 05/23/2018 10:23   US Renal  Result Date: 05/25/2018 CLINICAL DATA:  Kidney transplant.  Chronic renal disease. EXAM: RENAL / URINARY TRACT ULTRASOUND COMPLETE COMPARISON:  None available. FINDINGS: Right Kidney: Not Seen Left Kidney: Renal measurements: 7.2 x 2.8 x 2.7 cm = volume: 29 mL. Markedly increased echogenicity and collapsed collecting system. Several  benign-appearing cysts seen, the largest measuring 9 cm in midpole region of the kidney. The transplanted kidney measures 10.4 x 5.7 x 4.9 cm with a volume of 152 cc. It demonstrates a robust arterial and venous blood supply. No evidence of hydronephrosis. Normal echogenicity. Bladder: Minimal distension of the bladder. IMPRESSION: The transplanted kidney demonstrates normal sonographic appearance. Markedly echogenic left kidney consistent with end-stage medical renal disease with several benign-appearing cysts. Nonvisualization of the right kidney. Electronically Signed   By: Fidela Salisbury M.D.   On: 05/25/2018 14:13     Subjective: Patient seen and examined at bedside.  He feels better, complains of some cough.  No overnight fever, nausea or vomiting.  He is urinating okay.  Discharge Exam: Vitals:   05/25/18 2018 05/26/18 0452  BP: (!) 134/94 (!) 142/92  Pulse: (!) 103 (!) 101  Resp: 18 19  Temp: 98.2 F (36.8 C) 97.8 F (36.6 C)  SpO2: 99% 99%    General: Pt is alert, awake, not  in acute distress Cardiovascular: Mild tachycardia, S1/S2 + Respiratory: bilateral decreased breath sounds at bases, no wheezing Abdominal: Soft, NT, ND, bowel sounds + Extremities: no edema, no cyanosis    The results of significant diagnostics from this hospitalization (including imaging, microbiology, ancillary and laboratory) are listed below for reference.     Microbiology: Recent Results (from the past 240 hour(s))  Respiratory Panel by PCR     Status: Abnormal   Collection Time: 05/23/18  3:34 PM  Result Value Ref Range Status   Adenovirus NOT DETECTED NOT DETECTED Final   Coronavirus 229E NOT DETECTED NOT DETECTED Final    Comment: (NOTE) The Coronavirus on the Respiratory Panel, DOES NOT test for the novel  Coronavirus (2019 nCoV)    Coronavirus HKU1 NOT DETECTED NOT DETECTED Final   Coronavirus NL63 NOT DETECTED NOT DETECTED Final   Coronavirus OC43 NOT DETECTED NOT DETECTED  Final   Metapneumovirus NOT DETECTED NOT DETECTED Final   Rhinovirus / Enterovirus NOT DETECTED NOT DETECTED Final   Influenza A H1 2009 DETECTED (A) NOT DETECTED Final   Influenza B NOT DETECTED NOT DETECTED Final   Parainfluenza Virus 1 NOT DETECTED NOT DETECTED Final   Parainfluenza Virus 2 NOT DETECTED NOT DETECTED Final   Parainfluenza Virus 3 NOT DETECTED NOT DETECTED Final   Parainfluenza Virus 4 NOT DETECTED NOT DETECTED Final   Respiratory Syncytial Virus NOT DETECTED NOT DETECTED Final   Bordetella pertussis NOT DETECTED NOT DETECTED Final   Chlamydophila pneumoniae NOT DETECTED NOT DETECTED Final   Mycoplasma pneumoniae NOT DETECTED NOT DETECTED Final    Comment: Performed at [facility], [place]. [address], [place], [place] [address]  MRSA PCR Screening     Status: None   Collection Time: 05/23/18  7:41 PM  Result Value Ref Range Status   MRSA by PCR NEGATIVE NEGATIVE Final    Comment:        The GeneXpert MRSA Assay (FDA approved for NASAL specimens only), is one component of a comprehensive MRSA colonization surveillance program. It is not intended to diagnose MRSA infection nor to guide or monitor treatment for MRSA infections. Performed at [facility], [place] [address], [place], [place] [address]      Labs: BNP (last 3 results) Recent Labs    05/23/18 1255  BNP 093.2*   Basic Metabolic Panel: Recent Labs  Lab 05/23/18 1255 05/24/18 0323 05/25/18 0540 05/26/18 0640  NA 133* 133* 133* 134*  K 4.2 5.0 4.2 4.6  CL 105 109 107 108  CO2 17* 19* 18* 22  GLUCOSE 145* 112* 88 96  BUN 21 19 31* 31*  CREATININE 1.64* 1.44* 1.80* 1.63*  CALCIUM 9.0 8.4* 9.2 9.1   Liver Function Tests: Recent Labs  Lab 05/24/18 0323  AST 12*  ALT 11  ALKPHOS 36*  BILITOT 0.3  PROT 5.0*  ALBUMIN 3.0*   No results for input(s): LIPASE, AMYLASE in the last 168 hours. No results for input(s): AMMONIA in the last 168 hours. CBC: Recent Labs  Lab  05/23/18 1255 05/24/18 0323 05/25/18 0540 05/26/18 0640  WBC 5.9 7.0 5.4 5.5  NEUTROABS 5.1  --   --   --   HGB 13.2 11.1* 12.0* 11.3*  HCT 41.3 34.5* 37.8* 36.1*  MCV 93.9 95.3 95.2 95.8  PLT 118* 120* 146* 146*   Cardiac Enzymes: Recent Labs  Lab 05/23/18 1255  TROPONINI <0.03   BNP: Invalid input(s): POCBNP CBG: No results for input(s): GLUCAP in the  last 168 hours. D-Dimer No results for input(s): DDIMER in the last 72 hours. Hgb A1c No results for input(s): HGBA1C in the last 72 hours. Lipid Profile No results for input(s): CHOL, HDL, LDLCALC, TRIG, CHOLHDL, LDLDIRECT in the last 72 hours. Thyroid function studies No results for input(s): TSH, T4TOTAL, T3FREE, THYROIDAB in the last 72 hours.  Invalid input(s): FREET3 Anemia work up No results for input(s): VITAMINB12, FOLATE, FERRITIN, TIBC, IRON, RETICCTPCT in the last 72 hours. Urinalysis    Component Value Date/Time   COLORURINE YELLOW 05/23/2018 1727   APPEARANCEUR CLEAR 05/23/2018 1727   LABSPEC 1.015 05/23/2018 1727   PHURINE 7.5 05/23/2018 1727   GLUCOSEU 100 (A) 05/23/2018 1727   HGBUR TRACE (A) 05/23/2018 1727   BILIRUBINUR NEGATIVE 05/23/2018 1727   KETONESUR NEGATIVE 05/23/2018 1727   PROTEINUR NEGATIVE 05/23/2018 1727   NITRITE NEGATIVE 05/23/2018 1727   LEUKOCYTESUR NEGATIVE 05/23/2018 1727   Sepsis Labs Invalid input(s): PROCALCITONIN,  WBC,  LACTICIDVEN Microbiology Recent Results (from the past 240 hour(s))  Respiratory Panel by PCR     Status: Abnormal   Collection Time: 05/23/18  3:34 PM  Result Value Ref Range Status   Adenovirus NOT DETECTED NOT DETECTED Final   Coronavirus 229E NOT DETECTED NOT DETECTED Final    Comment: (NOTE) The Coronavirus on the Respiratory Panel, DOES NOT test for the novel  Coronavirus (2019 nCoV)    Coronavirus HKU1 NOT DETECTED NOT DETECTED Final   Coronavirus NL63 NOT DETECTED NOT DETECTED Final   Coronavirus OC43 NOT DETECTED NOT DETECTED Final    Metapneumovirus NOT DETECTED NOT DETECTED Final   Rhinovirus / Enterovirus NOT DETECTED NOT DETECTED Final   Influenza A H1 2009 DETECTED (A) NOT DETECTED Final   Influenza B NOT DETECTED NOT DETECTED Final   Parainfluenza Virus 1 NOT DETECTED NOT DETECTED Final   Parainfluenza Virus 2 NOT DETECTED NOT DETECTED Final   Parainfluenza Virus 3 NOT DETECTED NOT DETECTED Final   Parainfluenza Virus 4 NOT DETECTED NOT DETECTED Final   Respiratory Syncytial Virus NOT DETECTED NOT DETECTED Final   Bordetella pertussis NOT DETECTED NOT DETECTED Final   Chlamydophila pneumoniae NOT DETECTED NOT DETECTED Final   Mycoplasma pneumoniae NOT DETECTED NOT DETECTED Final    Comment: Performed at [facility], [place]. [address] [place], [place] [address]  MRSA PCR Screening     Status: None   Collection Time: 05/23/18  7:41 PM  Result Value Ref Range Status   MRSA by PCR NEGATIVE NEGATIVE Final    Comment:        The GeneXpert MRSA Assay (FDA approved for NASAL specimens only), is one component of a comprehensive MRSA colonization surveillance program. It is not intended to diagnose MRSA infection nor to guide or monitor treatment for MRSA infections. Performed at [facility], [place] [address] [place], [place] [address]      Time coordinating discharge: 35 minutes  SIGNED:   Aline August, MD  [facility] 05/26/2018, 8:30 AM

## 2019-03-25 ENCOUNTER — Other Ambulatory Visit: Payer: Self-pay

## 2019-03-25 ENCOUNTER — Emergency Department (HOSPITAL_BASED_OUTPATIENT_CLINIC_OR_DEPARTMENT_OTHER)
Admission: EM | Admit: 2019-03-25 | Discharge: 2019-03-25 | Disposition: A | Discharge status: Home / Self Care | Payer: Medicare Other | Attending: Emergency Medicine | Admitting: Emergency Medicine

## 2019-03-25 ENCOUNTER — Emergency Department (HOSPITAL_BASED_OUTPATIENT_CLINIC_OR_DEPARTMENT_OTHER): Payer: Medicare Other

## 2019-03-25 ENCOUNTER — Encounter (HOSPITAL_BASED_OUTPATIENT_CLINIC_OR_DEPARTMENT_OTHER): Payer: Self-pay

## 2019-03-25 DIAGNOSIS — S62101A Fracture of unspecified carpal bone, right wrist, initial encounter for closed fracture: Secondary | ICD-10-CM | POA: Diagnosis not present

## 2019-03-25 DIAGNOSIS — Z79899 Other long term (current) drug therapy: Secondary | ICD-10-CM | POA: Diagnosis not present

## 2019-03-25 DIAGNOSIS — Y999 Unspecified external cause status: Secondary | ICD-10-CM | POA: Diagnosis not present

## 2019-03-25 DIAGNOSIS — N189 Chronic kidney disease, unspecified: Secondary | ICD-10-CM | POA: Diagnosis not present

## 2019-03-25 DIAGNOSIS — W19XXXA Unspecified fall, initial encounter: Secondary | ICD-10-CM | POA: Insufficient documentation

## 2019-03-25 DIAGNOSIS — Y939 Activity, unspecified: Secondary | ICD-10-CM | POA: Insufficient documentation

## 2019-03-25 DIAGNOSIS — Y929 Unspecified place or not applicable: Secondary | ICD-10-CM | POA: Diagnosis not present

## 2019-03-25 DIAGNOSIS — Z7982 Long term (current) use of aspirin: Secondary | ICD-10-CM | POA: Insufficient documentation

## 2019-03-25 DIAGNOSIS — Z940 Kidney transplant status: Secondary | ICD-10-CM | POA: Diagnosis not present

## 2019-03-25 DIAGNOSIS — I129 Hypertensive chronic kidney disease with stage 1 through stage 4 chronic kidney disease, or unspecified chronic kidney disease: Secondary | ICD-10-CM | POA: Diagnosis not present

## 2019-03-25 DIAGNOSIS — S6991XA Unspecified injury of right wrist, hand and finger(s), initial encounter: Secondary | ICD-10-CM | POA: Diagnosis present

## 2019-03-25 DIAGNOSIS — S0990XA Unspecified injury of head, initial encounter: Secondary | ICD-10-CM | POA: Diagnosis not present

## 2019-03-25 NOTE — Discharge Instructions (Signed)
Take Tylenol or Motrin as needed for pain.  Ice affected area.  Please follow-up with hand surgery within a week for continued evaluation.  Return to the ER immediately for new or worsening symptoms or concerns, such as new or worsening pain, decreased range of motion of the fingers, unable to feel the fingers, or any concerns at all.

## 2019-03-25 NOTE — ED Triage Notes (Signed)
Pt presents to ED with swelling to both eyes and bruising, swelling to right wrist, and generalized pain. Pt Denies LOC, fall was not seen, but heard by family who came into room and assisted pt up. Pt denies being on a blood thinner.

## 2019-03-25 NOTE — ED Notes (Signed)
ED Provider at bedside. 

## 2019-03-25 NOTE — ED Provider Notes (Signed)
[facility] Provider Note   CSN: [id number] Arrival date & time: 03/25/19  [id number]     History Chief Complaint  Patient presents with  . Fall    [name] is a 78 y.o. male.  HPI    78 year old male, with a PMH of kidney transplant, HTN, presents status post fall on Sunday.  Patient states that he has had issues with his left leg for years.  He notes particularly over the last 6 months he has noticed weakness of the left leg.  He states that on Sunday his leg "gave out" causing him to fall.  He states he hit the middle of the forehead on the ground.  He also hit his right wrist.  He noticed swelling of the wrist yesterday which prompted him to come in today.  He denies LOC, neck pain.  He notes pain when he tries to grip the right hand.  He denies any other injuries.  He denies any syncope, dizziness, lightheadedness associated with his fall.  He denies any chest pain, shortness of breath, nausea, vomiting, fevers, chills.  He notes chronic left leg pain but denies any new or worsening pain, decreased range of motion, numbness, tingling of bilateral lower extremities.   Past Medical History:  Diagnosis Date  . Chronic kidney disease    HX OF KIDNEY TRANSPLANT 05/2011  . GERD (gastroesophageal reflux disease)   . Gout   . H/O kidney transplant   . Hypertension   . Trauma to eye, right 05/18/2013    Patient Active Problem List   Diagnosis Date Noted  . Acute respiratory distress 05/23/2018  . Influenza A 05/23/2018  . Fall 05/20/2013  . Traumatic subdural hematoma ([place]) 05/20/2013  . Gout 05/20/2013  . Hyperlipidemia 05/20/2013  . HTN (hypertension) 05/20/2013  . History of renal transplant 05/20/2013  . GERD (gastroesophageal reflux disease) 05/20/2013  . Acute alcohol intoxication ([place]) 05/20/2013  . Traumatic orbital hematoma 05/18/2013  . Subconjunctival hemorrhage of right eye 05/18/2013    Past Surgical History:  Procedure  Laterality Date  . CHOLECYSTECTOMY    . KIDNEY TRANSPLANT         Family History  Problem Relation Age of Onset  . Arthritis Mother   . Heart disease Mother     Social History   Tobacco Use  . Smoking status: Never Smoker  . Smokeless tobacco: Never Used  Substance Use Topics  . Alcohol use: No  . Drug use: No    Home Medications Prior to Admission medications   Medication Sig Start Date End Date Taking? Authorizing Provider  allopurinol (ZYLOPRIM) 100 MG tablet Take 100 mg by mouth daily.    [provider]  amLODipine (NORVASC) 10 MG tablet Take 10 mg by mouth daily.  03/31/18   [provider]  aspirin 81 MG chewable tablet Chew 81 mg by mouth daily.    [provider]  calcitRIOL (ROCALTROL) 0.5 MCG capsule Take 0.5 mcg by mouth every evening.  03/31/18   [provider]  dextromethorphan-guaiFENesin (MUCINEX DM) 30-600 MG 12hr tablet Take 1 tablet by mouth 2 (two) times daily as needed for cough. 05/26/18   Aline August, MD  HYDROcodone-acetaminophen (NORCO/VICODIN) 5-325 MG tablet Take 1-2 tablets by mouth every 6 (six) hours as needed for moderate pain. 01/14/16   Fredia Sorrow, MD  K Phos Mono-Sod Phos Di & Mono (PHOSPHA 250 NEUTRAL) [phone] MG TABS Take 1 tablet by mouth daily.  05/07/18   [provider]  magnesium oxide (MAG-OX) 400 MG tablet Take 400 mg by mouth 2 (two) times daily.  05/07/18   [provider]  mycophenolate (MYFORTIC) 180 MG EC tablet Take 360 mg by mouth 2 (two) times daily.    [provider]  ondansetron (ZOFRAN) 4 MG tablet Take 1 tablet (4 mg total) by mouth every 6 (six) hours as needed for nausea. 05/26/18   Aline August, MD  predniSONE (DELTASONE) 10 MG tablet Take 10 mg by mouth daily with breakfast.  05/07/18   [provider]  ranitidine (ZANTAC) 75 MG tablet Take 75 mg by mouth daily.    [provider]  simvastatin (ZOCOR) 20 MG tablet Take 20 mg by  mouth every evening.    [provider]  sodium bicarbonate 650 MG tablet Take 650 mg by mouth 3 (three) times daily.    [provider]  sulfamethoxazole-trimethoprim (BACTRIM,SEPTRA) 400-80 MG per tablet Take 1 tablet by mouth 3 (three) times a week. Monday, Wednesday and Friday    [provider]  tacrolimus (PROGRAF) 1 MG capsule Take 4 mg by mouth 2 (two) times daily.     [provider]    Allergies    Patient has no known allergies.  Review of Systems   Review of Systems  Constitutional: Negative for chills and fever.  Respiratory: Negative for shortness of breath.   Cardiovascular: Negative for chest pain.  Gastrointestinal: Negative for abdominal pain, nausea and vomiting.  Musculoskeletal: Positive for joint swelling. Negative for back pain, neck pain and neck stiffness.    Physical Exam Updated Vital Signs BP 132/73   Pulse 95   Temp 98.2 F (36.8 C) (Oral)   Resp 16   Ht 5\' 8"  (1.727 m)   Wt 78 kg   SpO2 100%   BMI 26.15 kg/m   Physical Exam Vitals and nursing note reviewed.  Constitutional:      Appearance: He is well-developed.  HENT:     Head: Normocephalic and atraumatic.  Eyes:     Conjunctiva/sclera: Conjunctivae normal.  Cardiovascular:     Rate and Rhythm: Regular rhythm. Tachycardia present.     Heart sounds: Normal heart sounds. No murmur.  Pulmonary:     Effort: Pulmonary effort is normal. No respiratory distress.     Breath sounds: Normal breath sounds. No wheezing or rales.  Abdominal:     General: Bowel sounds are normal. There is no distension.     Palpations: Abdomen is soft.     Tenderness: There is no abdominal tenderness.  Musculoskeletal:        General: No deformity.     Right wrist: Swelling and bony tenderness present. No crepitus. Decreased range of motion. Normal pulse.     Left wrist: Normal.     Cervical back: Neck supple.     Right upper leg: Normal.     Left upper leg: Normal.      Right lower leg: Normal.     Left lower leg: Normal.  Skin:    General: Skin is warm and dry.     Findings: No erythema or rash.  Neurological:     Mental Status: He is alert and oriented to person, place, and time.     Motor: Weakness (left leg strength 4/5 as compared to the right) present.  Psychiatric:        Behavior: Behavior normal.     ED Results / Procedures / Treatments   Labs (all labs  ordered are listed, but only abnormal results are displayed) Labs Reviewed - No data to display  EKG None  Radiology DG Wrist Complete Right  Result Date: 03/25/2019 CLINICAL DATA:  Is fall, bruising to wrist and hand, generalized pain EXAM: RIGHT WRIST - COMPLETE 3+ VIEW; RIGHT HAND - COMPLETE 3+ VIEW COMPARISON:  None FINDINGS: Dislocation of the second metacarpal phalangeal joint, index finger. Degenerative changes about the hand and wrist. Added density on lateral view overlying the volar wrist suggest avulsion injury in this location. Likely remote injury to the triquetral bone, on the lateral view as well along the dorsal aspect of the wrist. Osteopenia and signs of vascular disease. IMPRESSION: 1. Dislocation of the second metacarpophalangeal joint, index finger. 2. Question of avulsion injury along the volar surface of the wrist. CT imaging may be helpful for further assessment. 3. Degenerative changes about the wrist. 4. Osteopenia and signs of vascular disease. Electronically Signed   By: Zetta Bills M.D.   On: 03/25/2019 10:55   CT Head Wo Contrast  Result Date: 03/25/2019 CLINICAL DATA:  Head trauma secondary to a fall three days ago. Swelling around the orbits. EXAM: CT HEAD WITHOUT CONTRAST TECHNIQUE: Contiguous axial images were obtained from the base of the skull through the vertex without intravenous contrast. COMPARISON:  CT scan dated 05/19/2013 FINDINGS: Brain: No evidence of acute infarction, hemorrhage, hydrocephalus, extra-axial collection or mass lesion/mass effect.  There are old lacunar infarcts in the head of the left caudate nucleus, right side of the pons, and in the right external capsule. There is diffuse cerebral cortical and cerebellar atrophy, slightly increased since the prior study. Ventricles are minimally increased in size since the prior study consistent with a progressive slight atrophy. Vascular: No hyperdense vessel or unexpected calcification. Skull: Normal. Negative for fracture or focal lesion. Sinuses/Orbits: Normal. Other: Minimal scalp swelling over the left side of the forehead. IMPRESSION: 1. No acute intracranial abnormality. Minimal scalp swelling over the left side of the forehead. 2. Slight progression of diffuse cerebral and cerebellar atrophy. 3. Old lacunar infarcts. Electronically Signed   By: Lorriane Shire M.D.   On: 03/25/2019 10:26   DG Hand Complete Right  Result Date: 03/25/2019 CLINICAL DATA:  Is fall, bruising to wrist and hand, generalized pain EXAM: RIGHT WRIST - COMPLETE 3+ VIEW; RIGHT HAND - COMPLETE 3+ VIEW COMPARISON:  None FINDINGS: Dislocation of the second metacarpal phalangeal joint, index finger. Degenerative changes about the hand and wrist. Added density on lateral view overlying the volar wrist suggest avulsion injury in this location. Likely remote injury to the triquetral bone, on the lateral view as well along the dorsal aspect of the wrist. Osteopenia and signs of vascular disease. IMPRESSION: 1. Dislocation of the second metacarpophalangeal joint, index finger. 2. Question of avulsion injury along the volar surface of the wrist. CT imaging may be helpful for further assessment. 3. Degenerative changes about the wrist. 4. Osteopenia and signs of vascular disease. Electronically Signed   By: Zetta Bills M.D.   On: 03/25/2019 10:55    Procedures Procedures (including critical care time)  Medications Ordered in ED Medications - No data to display  ED Course  I have reviewed the triage vital signs and the  nursing notes.  Pertinent labs & imaging results that were available during my care of the patient were reviewed by me and considered in my medical decision making (see chart for details).  Clinical Course as of Mar 24 1109  Wed Mar 25, 2019  1015 Patient was seen by myself as well as PA provider.  Briefly 78 year old male presents with mechanical fall a few days ago, reporting pain in his right wrist as well as a headache.  He thinks he struck his head on the ground.  He is not on any blood thinners.  On exam the patient appears quite comfortable.  He is mildly tachycardic.  He does have some swelling around the right wrist.  He is neurovascularly intact.  There is no evidence of head trauma, appears to have chronic swelling near the eyes which tells me his had for many years.  Plan is to obtain a CT scan of the brain as well as x-rays of the wrist.   [MT]    Clinical Course User Index [MT] Trifan, Carola Rhine, MD   MDM Rules/Calculators/A&P                      Presents several days after a fall.  He did note that he hit his head.  Physical exam notable for tenderness and swelling of the right wrist.  He does have full range of motion of the right digits, normal capillary refill.  He has a bounding radial pulse on the right.  Patient had imaging of his head, right wrist and right hand.  His head CT shows no evidence of intracranial hemorrhage.  Patient has no cervical spine tenderness, decreased range of motion of the neck.  His right hand imaging shows a possible dislocation of the second MCP joint.  Patient has no tenderness over this joint and has full range of motion of the right index finger.  He states that he has had a bony abnormality in this area for many years.  History and physical not consistent with acute dislocation of the second MCP.  There is a possible avulsion noted along the volar aspect of the wrist.  Will place in a sugar tong splint and encourage close follow-up.  Discussed with  patient and he is agreeable with plan.  He is ready and stable for discharge.   At this time there does not appear to be any evidence of an acute emergency medical condition and the patient appears stable for discharge with appropriate outpatient follow up.Diagnosis was discussed with patient who verbalizes understanding and is agreeable to discharge. Pt case discussed with Dr. Langston Masker who agrees with my plan.    Final Clinical Impression(s) / ED Diagnoses Final diagnoses:  None    Rx / DC Orders ED Discharge Orders    None       Rachel Moulds 03/25/19 1807    Wyvonnia Dusky, MD 03/25/19 [facility] [place]

## 2019-03-25 NOTE — ED Notes (Signed)
Patient transported to CT 

## 2019-05-29 DIAGNOSIS — M1712 Unilateral primary osteoarthritis, left knee: Secondary | ICD-10-CM | POA: Diagnosis not present

## 2019-06-01 DIAGNOSIS — Z940 Kidney transplant status: Secondary | ICD-10-CM | POA: Diagnosis not present

## 2019-06-01 DIAGNOSIS — Z4822 Encounter for aftercare following kidney transplant: Secondary | ICD-10-CM | POA: Diagnosis not present

## 2019-06-02 DIAGNOSIS — Z940 Kidney transplant status: Secondary | ICD-10-CM | POA: Diagnosis not present

## 2019-06-02 DIAGNOSIS — E785 Hyperlipidemia, unspecified: Secondary | ICD-10-CM | POA: Diagnosis not present

## 2019-06-02 DIAGNOSIS — Z298 Encounter for other specified prophylactic measures: Secondary | ICD-10-CM | POA: Diagnosis not present

## 2019-06-02 DIAGNOSIS — Z4822 Encounter for aftercare following kidney transplant: Secondary | ICD-10-CM | POA: Diagnosis not present

## 2019-06-02 DIAGNOSIS — Z9989 Dependence on other enabling machines and devices: Secondary | ICD-10-CM | POA: Diagnosis not present

## 2019-06-02 DIAGNOSIS — M1712 Unilateral primary osteoarthritis, left knee: Secondary | ICD-10-CM | POA: Diagnosis not present

## 2019-06-02 DIAGNOSIS — Z792 Long term (current) use of antibiotics: Secondary | ICD-10-CM | POA: Diagnosis not present

## 2019-06-02 DIAGNOSIS — Z8619 Personal history of other infectious and parasitic diseases: Secondary | ICD-10-CM | POA: Diagnosis not present

## 2019-06-02 DIAGNOSIS — I10 Essential (primary) hypertension: Secondary | ICD-10-CM | POA: Diagnosis not present

## 2019-06-02 DIAGNOSIS — Z7952 Long term (current) use of systemic steroids: Secondary | ICD-10-CM | POA: Diagnosis not present

## 2019-06-02 DIAGNOSIS — Z79899 Other long term (current) drug therapy: Secondary | ICD-10-CM | POA: Diagnosis not present

## 2019-06-02 DIAGNOSIS — G894 Chronic pain syndrome: Secondary | ICD-10-CM | POA: Diagnosis not present

## 2019-06-02 DIAGNOSIS — M25562 Pain in left knee: Secondary | ICD-10-CM | POA: Diagnosis not present

## 2019-06-08 DIAGNOSIS — Z940 Kidney transplant status: Secondary | ICD-10-CM | POA: Diagnosis not present

## 2019-06-08 DIAGNOSIS — N390 Urinary tract infection, site not specified: Secondary | ICD-10-CM | POA: Diagnosis not present

## 2019-07-20 DIAGNOSIS — M25462 Effusion, left knee: Secondary | ICD-10-CM | POA: Diagnosis not present

## 2019-07-20 DIAGNOSIS — I10 Essential (primary) hypertension: Secondary | ICD-10-CM | POA: Diagnosis not present

## 2019-07-20 DIAGNOSIS — M1712 Unilateral primary osteoarthritis, left knee: Secondary | ICD-10-CM | POA: Diagnosis not present

## 2019-07-20 DIAGNOSIS — Z940 Kidney transplant status: Secondary | ICD-10-CM | POA: Diagnosis not present

## 2019-08-25 DIAGNOSIS — I119 Hypertensive heart disease without heart failure: Secondary | ICD-10-CM | POA: Diagnosis not present

## 2019-08-25 DIAGNOSIS — R0602 Shortness of breath: Secondary | ICD-10-CM | POA: Diagnosis not present

## 2019-08-25 DIAGNOSIS — I10 Essential (primary) hypertension: Secondary | ICD-10-CM | POA: Diagnosis not present

## 2019-08-25 DIAGNOSIS — E538 Deficiency of other specified B group vitamins: Secondary | ICD-10-CM | POA: Diagnosis not present

## 2019-08-25 DIAGNOSIS — M1712 Unilateral primary osteoarthritis, left knee: Secondary | ICD-10-CM | POA: Diagnosis not present

## 2019-09-11 DIAGNOSIS — E213 Hyperparathyroidism, unspecified: Secondary | ICD-10-CM | POA: Diagnosis not present

## 2019-09-11 DIAGNOSIS — G8929 Other chronic pain: Secondary | ICD-10-CM | POA: Diagnosis not present

## 2019-09-11 DIAGNOSIS — M25562 Pain in left knee: Secondary | ICD-10-CM | POA: Diagnosis not present

## 2019-09-11 DIAGNOSIS — M48061 Spinal stenosis, lumbar region without neurogenic claudication: Secondary | ICD-10-CM | POA: Diagnosis not present

## 2019-09-11 DIAGNOSIS — E559 Vitamin D deficiency, unspecified: Secondary | ICD-10-CM | POA: Diagnosis not present

## 2019-09-11 DIAGNOSIS — M1712 Unilateral primary osteoarthritis, left knee: Secondary | ICD-10-CM | POA: Diagnosis not present

## 2019-09-11 DIAGNOSIS — Z940 Kidney transplant status: Secondary | ICD-10-CM | POA: Diagnosis not present

## 2019-09-11 DIAGNOSIS — M85862 Other specified disorders of bone density and structure, left lower leg: Secondary | ICD-10-CM | POA: Diagnosis not present

## 2019-09-14 DIAGNOSIS — N390 Urinary tract infection, site not specified: Secondary | ICD-10-CM | POA: Diagnosis not present

## 2019-09-14 DIAGNOSIS — Z940 Kidney transplant status: Secondary | ICD-10-CM | POA: Diagnosis not present

## 2019-09-20 DIAGNOSIS — R262 Difficulty in walking, not elsewhere classified: Secondary | ICD-10-CM | POA: Diagnosis not present

## 2019-09-20 DIAGNOSIS — R531 Weakness: Secondary | ICD-10-CM | POA: Diagnosis not present

## 2019-09-20 DIAGNOSIS — I10 Essential (primary) hypertension: Secondary | ICD-10-CM | POA: Diagnosis not present

## 2019-09-20 DIAGNOSIS — R2689 Other abnormalities of gait and mobility: Secondary | ICD-10-CM | POA: Diagnosis not present

## 2019-09-20 DIAGNOSIS — M6281 Muscle weakness (generalized): Secondary | ICD-10-CM | POA: Diagnosis not present

## 2019-09-25 DIAGNOSIS — M1712 Unilateral primary osteoarthritis, left knee: Secondary | ICD-10-CM | POA: Diagnosis not present

## 2019-09-25 DIAGNOSIS — E538 Deficiency of other specified B group vitamins: Secondary | ICD-10-CM | POA: Diagnosis not present

## 2019-09-25 DIAGNOSIS — I119 Hypertensive heart disease without heart failure: Secondary | ICD-10-CM | POA: Diagnosis not present

## 2019-09-25 DIAGNOSIS — E559 Vitamin D deficiency, unspecified: Secondary | ICD-10-CM | POA: Diagnosis not present

## 2019-09-25 DIAGNOSIS — Z0001 Encounter for general adult medical examination with abnormal findings: Secondary | ICD-10-CM | POA: Diagnosis not present

## 2019-10-01 DIAGNOSIS — Z940 Kidney transplant status: Secondary | ICD-10-CM | POA: Diagnosis not present

## 2019-10-01 DIAGNOSIS — G8929 Other chronic pain: Secondary | ICD-10-CM | POA: Diagnosis not present

## 2019-10-01 DIAGNOSIS — M1712 Unilateral primary osteoarthritis, left knee: Secondary | ICD-10-CM | POA: Diagnosis not present

## 2019-10-01 DIAGNOSIS — M25562 Pain in left knee: Secondary | ICD-10-CM | POA: Diagnosis not present

## 2019-10-13 DIAGNOSIS — I219 Acute myocardial infarction, unspecified: Secondary | ICD-10-CM | POA: Diagnosis not present

## 2019-10-13 DIAGNOSIS — Z940 Kidney transplant status: Secondary | ICD-10-CM | POA: Diagnosis not present

## 2019-10-13 DIAGNOSIS — B338 Other specified viral diseases: Secondary | ICD-10-CM | POA: Diagnosis not present

## 2019-10-16 DIAGNOSIS — N390 Urinary tract infection, site not specified: Secondary | ICD-10-CM | POA: Diagnosis not present

## 2019-10-16 DIAGNOSIS — Z940 Kidney transplant status: Secondary | ICD-10-CM | POA: Diagnosis not present

## 2019-10-30 DIAGNOSIS — M1712 Unilateral primary osteoarthritis, left knee: Secondary | ICD-10-CM | POA: Diagnosis not present

## 2019-10-30 DIAGNOSIS — E559 Vitamin D deficiency, unspecified: Secondary | ICD-10-CM | POA: Diagnosis not present

## 2019-10-30 DIAGNOSIS — E7800 Pure hypercholesterolemia, unspecified: Secondary | ICD-10-CM | POA: Diagnosis not present

## 2019-10-30 DIAGNOSIS — E538 Deficiency of other specified B group vitamins: Secondary | ICD-10-CM | POA: Diagnosis not present

## 2019-10-30 DIAGNOSIS — I119 Hypertensive heart disease without heart failure: Secondary | ICD-10-CM | POA: Diagnosis not present

## 2019-11-11 DIAGNOSIS — M7989 Other specified soft tissue disorders: Secondary | ICD-10-CM | POA: Diagnosis not present

## 2019-11-11 DIAGNOSIS — M1712 Unilateral primary osteoarthritis, left knee: Secondary | ICD-10-CM | POA: Diagnosis not present

## 2019-11-11 DIAGNOSIS — I83229 Varicose veins of left lower extremity with both ulcer of unspecified site and inflammation: Secondary | ICD-10-CM | POA: Diagnosis not present

## 2019-11-11 DIAGNOSIS — I83209 Varicose veins of unspecified lower extremity with both ulcer of unspecified site and inflammation: Secondary | ICD-10-CM | POA: Diagnosis not present

## 2019-11-11 DIAGNOSIS — L97929 Non-pressure chronic ulcer of unspecified part of left lower leg with unspecified severity: Secondary | ICD-10-CM | POA: Diagnosis not present

## 2019-11-11 DIAGNOSIS — L97919 Non-pressure chronic ulcer of unspecified part of right lower leg with unspecified severity: Secondary | ICD-10-CM | POA: Diagnosis not present

## 2019-11-11 DIAGNOSIS — I83219 Varicose veins of right lower extremity with both ulcer of unspecified site and inflammation: Secondary | ICD-10-CM | POA: Diagnosis not present

## 2019-11-11 DIAGNOSIS — Z79899 Other long term (current) drug therapy: Secondary | ICD-10-CM | POA: Diagnosis not present

## 2019-11-27 DIAGNOSIS — M7989 Other specified soft tissue disorders: Secondary | ICD-10-CM | POA: Diagnosis not present

## 2019-11-27 DIAGNOSIS — R600 Localized edema: Secondary | ICD-10-CM | POA: Diagnosis not present

## 2019-11-27 DIAGNOSIS — Z940 Kidney transplant status: Secondary | ICD-10-CM | POA: Diagnosis not present

## 2019-11-27 DIAGNOSIS — L97929 Non-pressure chronic ulcer of unspecified part of left lower leg with unspecified severity: Secondary | ICD-10-CM | POA: Diagnosis not present

## 2019-11-27 DIAGNOSIS — I83222 Varicose veins of left lower extremity with both ulcer of calf and inflammation: Secondary | ICD-10-CM | POA: Diagnosis not present

## 2019-12-04 DIAGNOSIS — G8929 Other chronic pain: Secondary | ICD-10-CM | POA: Diagnosis not present

## 2019-12-04 DIAGNOSIS — M25562 Pain in left knee: Secondary | ICD-10-CM | POA: Diagnosis not present

## 2019-12-04 DIAGNOSIS — M7989 Other specified soft tissue disorders: Secondary | ICD-10-CM | POA: Diagnosis not present

## 2019-12-04 DIAGNOSIS — E878 Other disorders of electrolyte and fluid balance, not elsewhere classified: Secondary | ICD-10-CM | POA: Diagnosis not present

## 2019-12-21 DIAGNOSIS — M1712 Unilateral primary osteoarthritis, left knee: Secondary | ICD-10-CM | POA: Diagnosis not present

## 2019-12-31 DIAGNOSIS — I110 Hypertensive heart disease with heart failure: Secondary | ICD-10-CM | POA: Diagnosis not present

## 2019-12-31 DIAGNOSIS — I501 Left ventricular failure: Secondary | ICD-10-CM | POA: Diagnosis not present

## 2019-12-31 DIAGNOSIS — B338 Other specified viral diseases: Secondary | ICD-10-CM | POA: Diagnosis not present

## 2019-12-31 DIAGNOSIS — Z940 Kidney transplant status: Secondary | ICD-10-CM | POA: Diagnosis not present

## 2020-01-06 DIAGNOSIS — M2560 Stiffness of unspecified joint, not elsewhere classified: Secondary | ICD-10-CM | POA: Diagnosis not present

## 2020-01-06 DIAGNOSIS — M1712 Unilateral primary osteoarthritis, left knee: Secondary | ICD-10-CM | POA: Diagnosis not present

## 2020-01-06 DIAGNOSIS — R262 Difficulty in walking, not elsewhere classified: Secondary | ICD-10-CM | POA: Diagnosis not present

## 2020-01-06 DIAGNOSIS — M6281 Muscle weakness (generalized): Secondary | ICD-10-CM | POA: Diagnosis not present

## 2020-01-15 DIAGNOSIS — N390 Urinary tract infection, site not specified: Secondary | ICD-10-CM | POA: Diagnosis not present

## 2020-01-15 DIAGNOSIS — Z940 Kidney transplant status: Secondary | ICD-10-CM | POA: Diagnosis not present

## 2020-01-22 DIAGNOSIS — E559 Vitamin D deficiency, unspecified: Secondary | ICD-10-CM | POA: Diagnosis not present

## 2020-01-22 DIAGNOSIS — R7303 Prediabetes: Secondary | ICD-10-CM | POA: Diagnosis not present

## 2020-01-22 DIAGNOSIS — E538 Deficiency of other specified B group vitamins: Secondary | ICD-10-CM | POA: Diagnosis not present

## 2020-01-22 DIAGNOSIS — R059 Cough, unspecified: Secondary | ICD-10-CM | POA: Diagnosis not present

## 2020-01-22 DIAGNOSIS — I119 Hypertensive heart disease without heart failure: Secondary | ICD-10-CM | POA: Diagnosis not present

## 2020-01-22 DIAGNOSIS — M1712 Unilateral primary osteoarthritis, left knee: Secondary | ICD-10-CM | POA: Diagnosis not present

## 2020-02-01 DIAGNOSIS — Z940 Kidney transplant status: Secondary | ICD-10-CM | POA: Diagnosis not present

## 2020-02-12 DIAGNOSIS — I119 Hypertensive heart disease without heart failure: Secondary | ICD-10-CM | POA: Diagnosis not present

## 2020-02-12 DIAGNOSIS — E538 Deficiency of other specified B group vitamins: Secondary | ICD-10-CM | POA: Diagnosis not present

## 2020-02-12 DIAGNOSIS — R7303 Prediabetes: Secondary | ICD-10-CM | POA: Diagnosis not present

## 2020-02-12 DIAGNOSIS — E559 Vitamin D deficiency, unspecified: Secondary | ICD-10-CM | POA: Diagnosis not present

## 2020-02-12 DIAGNOSIS — M1712 Unilateral primary osteoarthritis, left knee: Secondary | ICD-10-CM | POA: Diagnosis not present

## 2020-02-17 ENCOUNTER — Ambulatory Visit: Payer: Self-pay

## 2020-02-17 ENCOUNTER — Other Ambulatory Visit: Payer: Self-pay

## 2020-02-17 ENCOUNTER — Ambulatory Visit (INDEPENDENT_AMBULATORY_CARE_PROVIDER_SITE_OTHER): Payer: Medicare Other | Admitting: Orthopaedic Surgery

## 2020-02-17 ENCOUNTER — Telehealth: Payer: Self-pay

## 2020-02-17 VITALS — Ht 66.0 in | Wt 179.0 lb

## 2020-02-17 DIAGNOSIS — M25562 Pain in left knee: Secondary | ICD-10-CM

## 2020-02-17 DIAGNOSIS — M1712 Unilateral primary osteoarthritis, left knee: Secondary | ICD-10-CM | POA: Diagnosis not present

## 2020-02-17 DIAGNOSIS — G8929 Other chronic pain: Secondary | ICD-10-CM

## 2020-02-17 MED ORDER — METHYLPREDNISOLONE ACETATE 40 MG/ML IJ SUSP
40.0000 mg | INTRAMUSCULAR | Status: AC | PRN
  Administered 2020-02-17: 40 mg via INTRA_ARTICULAR

## 2020-02-17 MED ORDER — LIDOCAINE HCL 1 % IJ SOLN
3.0000 mL | INTRAMUSCULAR | Status: AC | PRN
  Administered 2020-02-17: 3 mL

## 2020-02-17 NOTE — Telephone Encounter (Signed)
Submitted for VOB for Synvisc One-Left knee

## 2020-02-17 NOTE — Progress Notes (Signed)
Office Visit Note   Patient: [name]           Date of Birth: 1940/09/04           MRN: [id number] Visit Date: 02/17/2020              Requested by: Benito Mccreedy, MD [id number] [place],  [place] [address] PCP: Benito Mccreedy, MD   Assessment & Plan: Visit Diagnoses:  1. Chronic pain of left knee   2. Unilateral primary osteoarthritis, left knee     Plan: I went over his x-rays in detail.  He and his wife understand that he does have severe end-stage arthritis of this left knee.  At some point he may need knee replacement surgery if this continues to detrimentally affect his life.  Right now we will today aspirate the knee and place a steroid injection in it.  His wife brought up the possibility of hyaluronic acid and I think is certainly reasonable to try for his knee.  We can order that and see him back in follow-up to place hyaluronic acid into the left knee.  Of note I did withdrawal about 50 cc more fluid from the knee and did not get everything out of his knee.  Follow-Up Instructions: Return in about 4 weeks (around 03/16/2020).   Orders:  Orders Placed This Encounter  Procedures  . Large Joint Inj  . XR Knee 1-2 Views Left   No orders of the defined types were placed in this encounter.     Procedures: Large Joint Inj: L knee on 02/17/2020 8:48 AM Indications: diagnostic evaluation and pain Details: 22 G 1.5 in needle, superolateral approach  Arthrogram: No  Medications: 3 mL lidocaine 1 %; 40 mg methylPREDNISolone acetate 40 MG/ML Outcome: tolerated well, no immediate complications Procedure, treatment alternatives, risks and benefits explained, specific risks discussed. Consent was given by the patient. Immediately prior to procedure a time out was called to verify the correct patient, procedure, equipment, support staff and site/side marked as required. Patient was prepped and draped in the usual sterile fashion.       Clinical  Data: No additional findings.   Subjective: Chief Complaint  Patient presents with  . Left Knee - Pain  The patient is a 79 year old gentleman who ambulates with a cane and has been dealing with severe left knee pain for years now.  He says beginning much worse and swelling.  He does have a history of a kidney transplant.  He is not a diabetic and is not on blood thinning medications.  The knee has gotten worse with time.  At this point it is debilitating in terms of definitely affecting his actives daily living, his quality of life, and his mobility.  HPI  Review of Systems He currently denies any headache, chest pain, shortness of breath, fever, chills, nausea, vomiting  Objective: Vital Signs: Ht 5\' 6"  (1.676 m)   Wt 179 lb (81.2 kg)   BMI 28.89 kg/m   Physical Exam He is alert and orient x3 and in no acute distress Ortho Exam Examination of his left knee does show a large joint effusion.  There is significant lateral joint line tenderness as well.  There is pain throughout the arc of motion with patellofemoral crepitation.  There are calcifications I can easily palpate in the suprapatella and prepatellar area of the knee.  When he walks he does have significant valgus malalignment of that left knee. Specialty Comments:  No  specialty comments available.  Imaging: XR Knee 1-2 Views Left  Result Date: 02/17/2020 2 views of the left knee show severe end-stage arthritic changes.  There is complete loss of the lateral joint space.  There is slight valgus malalignment.  There is also calcifications in the suprapatellar area of the knee.  The bone is osteopenic.    PMFS History: Patient Active Problem List   Diagnosis Date Noted  . Unilateral primary osteoarthritis, left knee 02/17/2020  . Acute respiratory distress 05/23/2018  . Influenza A 05/23/2018  . Fall 05/20/2013  . Traumatic subdural hematoma ([place]) 05/20/2013  . Gout 05/20/2013  . Hyperlipidemia 05/20/2013  . HTN  (hypertension) 05/20/2013  . History of renal transplant 05/20/2013  . GERD (gastroesophageal reflux disease) 05/20/2013  . Acute alcohol intoxication ([place]) 05/20/2013  . Traumatic orbital hematoma 05/18/2013  . Subconjunctival hemorrhage of right eye 05/18/2013   Past Medical History:  Diagnosis Date  . Chronic kidney disease    HX OF KIDNEY TRANSPLANT 05/2011  . GERD (gastroesophageal reflux disease)   . Gout   . H/O kidney transplant   . Hypertension   . Trauma to eye, right 05/18/2013    Family History  Problem Relation Age of Onset  . Arthritis Mother   . Heart disease Mother     Past Surgical History:  Procedure Laterality Date  . CHOLECYSTECTOMY    . KIDNEY TRANSPLANT     Social History   Occupational History  . Not on file  Tobacco Use  . Smoking status: Never Smoker  . Smokeless tobacco: Never Used  Substance and Sexual Activity  . Alcohol use: No  . Drug use: No  . Sexual activity: Not on file

## 2020-02-24 ENCOUNTER — Telehealth: Payer: Self-pay

## 2020-02-24 NOTE — Telephone Encounter (Signed)
Called and advised.

## 2020-02-24 NOTE — Telephone Encounter (Signed)
Approved for Synvisc one-Left knee Dr. Margarito Liner and Bill $30 copay 20% OOP No prior auth required

## 2020-03-04 DIAGNOSIS — E559 Vitamin D deficiency, unspecified: Secondary | ICD-10-CM | POA: Diagnosis not present

## 2020-03-04 DIAGNOSIS — M1712 Unilateral primary osteoarthritis, left knee: Secondary | ICD-10-CM | POA: Diagnosis not present

## 2020-03-04 DIAGNOSIS — E538 Deficiency of other specified B group vitamins: Secondary | ICD-10-CM | POA: Diagnosis not present

## 2020-03-04 DIAGNOSIS — R7303 Prediabetes: Secondary | ICD-10-CM | POA: Diagnosis not present

## 2020-03-04 DIAGNOSIS — I119 Hypertensive heart disease without heart failure: Secondary | ICD-10-CM | POA: Diagnosis not present

## 2020-03-07 DIAGNOSIS — I4510 Unspecified right bundle-branch block: Secondary | ICD-10-CM | POA: Diagnosis not present

## 2020-03-07 DIAGNOSIS — R531 Weakness: Secondary | ICD-10-CM | POA: Diagnosis not present

## 2020-03-07 DIAGNOSIS — I272 Pulmonary hypertension, unspecified: Secondary | ICD-10-CM | POA: Diagnosis not present

## 2020-03-07 DIAGNOSIS — M899 Disorder of bone, unspecified: Secondary | ICD-10-CM | POA: Diagnosis not present

## 2020-03-07 DIAGNOSIS — N183 Chronic kidney disease, stage 3 unspecified: Secondary | ICD-10-CM | POA: Diagnosis not present

## 2020-03-07 DIAGNOSIS — I700 Atherosclerosis of aorta: Secondary | ICD-10-CM | POA: Diagnosis not present

## 2020-03-07 DIAGNOSIS — I129 Hypertensive chronic kidney disease with stage 1 through stage 4 chronic kidney disease, or unspecified chronic kidney disease: Secondary | ICD-10-CM | POA: Diagnosis not present

## 2020-03-07 DIAGNOSIS — I959 Hypotension, unspecified: Secondary | ICD-10-CM | POA: Diagnosis not present

## 2020-03-07 DIAGNOSIS — M25562 Pain in left knee: Secondary | ICD-10-CM | POA: Diagnosis not present

## 2020-03-07 DIAGNOSIS — I10 Essential (primary) hypertension: Secondary | ICD-10-CM | POA: Diagnosis not present

## 2020-03-07 DIAGNOSIS — E876 Hypokalemia: Secondary | ICD-10-CM | POA: Diagnosis not present

## 2020-03-07 DIAGNOSIS — N179 Acute kidney failure, unspecified: Secondary | ICD-10-CM | POA: Diagnosis not present

## 2020-03-07 DIAGNOSIS — R000 Tachycardia, unspecified: Secondary | ICD-10-CM | POA: Diagnosis not present

## 2020-03-07 DIAGNOSIS — R55 Syncope and collapse: Secondary | ICD-10-CM | POA: Diagnosis not present

## 2020-03-07 DIAGNOSIS — R059 Cough, unspecified: Secondary | ICD-10-CM | POA: Diagnosis not present

## 2020-03-07 DIAGNOSIS — G319 Degenerative disease of nervous system, unspecified: Secondary | ICD-10-CM | POA: Diagnosis not present

## 2020-03-07 DIAGNOSIS — M25462 Effusion, left knee: Secondary | ICD-10-CM | POA: Diagnosis not present

## 2020-03-07 DIAGNOSIS — Z79899 Other long term (current) drug therapy: Secondary | ICD-10-CM | POA: Diagnosis not present

## 2020-03-07 DIAGNOSIS — Z7952 Long term (current) use of systemic steroids: Secondary | ICD-10-CM | POA: Diagnosis not present

## 2020-03-07 DIAGNOSIS — I6782 Cerebral ischemia: Secondary | ICD-10-CM | POA: Diagnosis not present

## 2020-03-07 DIAGNOSIS — G9389 Other specified disorders of brain: Secondary | ICD-10-CM | POA: Diagnosis not present

## 2020-03-07 DIAGNOSIS — K449 Diaphragmatic hernia without obstruction or gangrene: Secondary | ICD-10-CM | POA: Diagnosis not present

## 2020-03-07 DIAGNOSIS — E1122 Type 2 diabetes mellitus with diabetic chronic kidney disease: Secondary | ICD-10-CM | POA: Diagnosis not present

## 2020-03-07 DIAGNOSIS — W19XXXA Unspecified fall, initial encounter: Secondary | ICD-10-CM | POA: Diagnosis not present

## 2020-03-07 DIAGNOSIS — M1712 Unilateral primary osteoarthritis, left knee: Secondary | ICD-10-CM | POA: Diagnosis not present

## 2020-03-07 DIAGNOSIS — R52 Pain, unspecified: Secondary | ICD-10-CM | POA: Diagnosis not present

## 2020-03-07 DIAGNOSIS — I491 Atrial premature depolarization: Secondary | ICD-10-CM | POA: Diagnosis not present

## 2020-03-07 DIAGNOSIS — R7989 Other specified abnormal findings of blood chemistry: Secondary | ICD-10-CM | POA: Diagnosis not present

## 2020-03-07 DIAGNOSIS — Z743 Need for continuous supervision: Secondary | ICD-10-CM | POA: Diagnosis not present

## 2020-03-07 DIAGNOSIS — T8619 Other complication of kidney transplant: Secondary | ICD-10-CM | POA: Diagnosis not present

## 2020-03-08 DIAGNOSIS — I10 Essential (primary) hypertension: Secondary | ICD-10-CM | POA: Diagnosis not present

## 2020-03-08 DIAGNOSIS — I4510 Unspecified right bundle-branch block: Secondary | ICD-10-CM | POA: Diagnosis not present

## 2020-03-08 DIAGNOSIS — R000 Tachycardia, unspecified: Secondary | ICD-10-CM | POA: Diagnosis not present

## 2020-03-08 DIAGNOSIS — R531 Weakness: Secondary | ICD-10-CM | POA: Diagnosis not present

## 2020-03-08 DIAGNOSIS — M25562 Pain in left knee: Secondary | ICD-10-CM | POA: Diagnosis not present

## 2020-03-08 DIAGNOSIS — M25462 Effusion, left knee: Secondary | ICD-10-CM | POA: Diagnosis not present

## 2020-03-08 DIAGNOSIS — R55 Syncope and collapse: Secondary | ICD-10-CM | POA: Diagnosis not present

## 2020-03-08 DIAGNOSIS — Z940 Kidney transplant status: Secondary | ICD-10-CM | POA: Diagnosis not present

## 2020-03-09 DIAGNOSIS — R55 Syncope and collapse: Secondary | ICD-10-CM | POA: Diagnosis not present

## 2020-03-09 DIAGNOSIS — R531 Weakness: Secondary | ICD-10-CM | POA: Diagnosis not present

## 2020-03-09 DIAGNOSIS — M1712 Unilateral primary osteoarthritis, left knee: Secondary | ICD-10-CM | POA: Diagnosis not present

## 2020-03-09 DIAGNOSIS — M2550 Pain in unspecified joint: Secondary | ICD-10-CM | POA: Diagnosis not present

## 2020-03-09 DIAGNOSIS — I4510 Unspecified right bundle-branch block: Secondary | ICD-10-CM | POA: Diagnosis not present

## 2020-03-09 DIAGNOSIS — Z79899 Other long term (current) drug therapy: Secondary | ICD-10-CM | POA: Diagnosis not present

## 2020-03-09 DIAGNOSIS — M1710 Unilateral primary osteoarthritis, unspecified knee: Secondary | ICD-10-CM | POA: Diagnosis not present

## 2020-03-09 DIAGNOSIS — N189 Chronic kidney disease, unspecified: Secondary | ICD-10-CM | POA: Diagnosis not present

## 2020-03-09 DIAGNOSIS — D649 Anemia, unspecified: Secondary | ICD-10-CM | POA: Diagnosis not present

## 2020-03-09 DIAGNOSIS — M11262 Other chondrocalcinosis, left knee: Secondary | ICD-10-CM | POA: Diagnosis not present

## 2020-03-09 DIAGNOSIS — R000 Tachycardia, unspecified: Secondary | ICD-10-CM | POA: Diagnosis not present

## 2020-03-09 DIAGNOSIS — R2681 Unsteadiness on feet: Secondary | ICD-10-CM | POA: Diagnosis not present

## 2020-03-09 DIAGNOSIS — E876 Hypokalemia: Secondary | ICD-10-CM | POA: Diagnosis not present

## 2020-03-09 DIAGNOSIS — Z743 Need for continuous supervision: Secondary | ICD-10-CM | POA: Diagnosis not present

## 2020-03-09 DIAGNOSIS — I959 Hypotension, unspecified: Secondary | ICD-10-CM | POA: Diagnosis not present

## 2020-03-09 DIAGNOSIS — Z940 Kidney transplant status: Secondary | ICD-10-CM | POA: Diagnosis not present

## 2020-03-09 DIAGNOSIS — Z7401 Bed confinement status: Secondary | ICD-10-CM | POA: Diagnosis not present

## 2020-03-09 DIAGNOSIS — M25462 Effusion, left knee: Secondary | ICD-10-CM | POA: Diagnosis not present

## 2020-03-09 DIAGNOSIS — R7989 Other specified abnormal findings of blood chemistry: Secondary | ICD-10-CM | POA: Diagnosis not present

## 2020-03-09 DIAGNOSIS — N183 Chronic kidney disease, stage 3 unspecified: Secondary | ICD-10-CM | POA: Diagnosis not present

## 2020-03-09 DIAGNOSIS — I10 Essential (primary) hypertension: Secondary | ICD-10-CM | POA: Diagnosis not present

## 2020-03-09 DIAGNOSIS — I700 Atherosclerosis of aorta: Secondary | ICD-10-CM | POA: Diagnosis not present

## 2020-03-09 DIAGNOSIS — T8619 Other complication of kidney transplant: Secondary | ICD-10-CM | POA: Diagnosis not present

## 2020-03-09 DIAGNOSIS — I272 Pulmonary hypertension, unspecified: Secondary | ICD-10-CM | POA: Diagnosis not present

## 2020-03-09 DIAGNOSIS — I6782 Cerebral ischemia: Secondary | ICD-10-CM | POA: Diagnosis not present

## 2020-03-09 DIAGNOSIS — M1180 Other specified crystal arthropathies, unspecified site: Secondary | ICD-10-CM | POA: Diagnosis not present

## 2020-03-09 DIAGNOSIS — I491 Atrial premature depolarization: Secondary | ICD-10-CM | POA: Diagnosis not present

## 2020-03-09 DIAGNOSIS — N179 Acute kidney failure, unspecified: Secondary | ICD-10-CM | POA: Diagnosis not present

## 2020-03-09 DIAGNOSIS — M6281 Muscle weakness (generalized): Secondary | ICD-10-CM | POA: Diagnosis not present

## 2020-03-09 DIAGNOSIS — I129 Hypertensive chronic kidney disease with stage 1 through stage 4 chronic kidney disease, or unspecified chronic kidney disease: Secondary | ICD-10-CM | POA: Diagnosis not present

## 2020-03-09 DIAGNOSIS — K449 Diaphragmatic hernia without obstruction or gangrene: Secondary | ICD-10-CM | POA: Diagnosis not present

## 2020-03-09 DIAGNOSIS — R5381 Other malaise: Secondary | ICD-10-CM | POA: Diagnosis not present

## 2020-03-09 DIAGNOSIS — Z7952 Long term (current) use of systemic steroids: Secondary | ICD-10-CM | POA: Diagnosis not present

## 2020-03-09 DIAGNOSIS — M899 Disorder of bone, unspecified: Secondary | ICD-10-CM | POA: Diagnosis not present

## 2020-03-09 DIAGNOSIS — E1122 Type 2 diabetes mellitus with diabetic chronic kidney disease: Secondary | ICD-10-CM | POA: Diagnosis not present

## 2020-03-10 DIAGNOSIS — I4510 Unspecified right bundle-branch block: Secondary | ICD-10-CM | POA: Diagnosis not present

## 2020-03-10 DIAGNOSIS — R000 Tachycardia, unspecified: Secondary | ICD-10-CM | POA: Diagnosis not present

## 2020-03-10 DIAGNOSIS — I491 Atrial premature depolarization: Secondary | ICD-10-CM | POA: Diagnosis not present

## 2020-03-12 DIAGNOSIS — M1710 Unilateral primary osteoarthritis, unspecified knee: Secondary | ICD-10-CM | POA: Diagnosis not present

## 2020-03-12 DIAGNOSIS — N189 Chronic kidney disease, unspecified: Secondary | ICD-10-CM | POA: Diagnosis not present

## 2020-03-12 DIAGNOSIS — M1180 Other specified crystal arthropathies, unspecified site: Secondary | ICD-10-CM | POA: Diagnosis not present

## 2020-03-12 DIAGNOSIS — R55 Syncope and collapse: Secondary | ICD-10-CM | POA: Diagnosis not present

## 2020-03-12 DIAGNOSIS — I10 Essential (primary) hypertension: Secondary | ICD-10-CM | POA: Diagnosis not present

## 2020-03-15 DIAGNOSIS — D649 Anemia, unspecified: Secondary | ICD-10-CM | POA: Diagnosis not present

## 2020-03-16 ENCOUNTER — Ambulatory Visit: Payer: PRIVATE HEALTH INSURANCE | Admitting: Orthopaedic Surgery

## 2020-03-19 DIAGNOSIS — N189 Chronic kidney disease, unspecified: Secondary | ICD-10-CM | POA: Diagnosis not present

## 2020-03-19 DIAGNOSIS — R55 Syncope and collapse: Secondary | ICD-10-CM | POA: Diagnosis not present

## 2020-03-19 DIAGNOSIS — M1180 Other specified crystal arthropathies, unspecified site: Secondary | ICD-10-CM | POA: Diagnosis not present

## 2020-03-19 DIAGNOSIS — M1710 Unilateral primary osteoarthritis, unspecified knee: Secondary | ICD-10-CM | POA: Diagnosis not present

## 2020-03-23 DIAGNOSIS — Z940 Kidney transplant status: Secondary | ICD-10-CM | POA: Diagnosis not present

## 2020-03-23 DIAGNOSIS — Z79899 Other long term (current) drug therapy: Secondary | ICD-10-CM | POA: Diagnosis not present

## 2020-03-23 DIAGNOSIS — M1710 Unilateral primary osteoarthritis, unspecified knee: Secondary | ICD-10-CM | POA: Diagnosis not present

## 2020-03-23 DIAGNOSIS — I951 Orthostatic hypotension: Secondary | ICD-10-CM | POA: Diagnosis not present

## 2020-03-23 DIAGNOSIS — K219 Gastro-esophageal reflux disease without esophagitis: Secondary | ICD-10-CM | POA: Diagnosis not present

## 2020-03-23 DIAGNOSIS — Z9181 History of falling: Secondary | ICD-10-CM | POA: Diagnosis not present

## 2020-03-23 DIAGNOSIS — N189 Chronic kidney disease, unspecified: Secondary | ICD-10-CM | POA: Diagnosis not present

## 2020-03-23 DIAGNOSIS — M11262 Other chondrocalcinosis, left knee: Secondary | ICD-10-CM | POA: Diagnosis not present

## 2020-03-23 DIAGNOSIS — Z7952 Long term (current) use of systemic steroids: Secondary | ICD-10-CM | POA: Diagnosis not present

## 2020-03-23 DIAGNOSIS — R55 Syncope and collapse: Secondary | ICD-10-CM | POA: Diagnosis not present

## 2020-03-23 DIAGNOSIS — E785 Hyperlipidemia, unspecified: Secondary | ICD-10-CM | POA: Diagnosis not present

## 2020-03-23 DIAGNOSIS — I129 Hypertensive chronic kidney disease with stage 1 through stage 4 chronic kidney disease, or unspecified chronic kidney disease: Secondary | ICD-10-CM | POA: Diagnosis not present

## 2020-03-23 DIAGNOSIS — M25462 Effusion, left knee: Secondary | ICD-10-CM | POA: Diagnosis not present

## 2020-03-24 DIAGNOSIS — I951 Orthostatic hypotension: Secondary | ICD-10-CM | POA: Diagnosis not present

## 2020-03-24 DIAGNOSIS — I129 Hypertensive chronic kidney disease with stage 1 through stage 4 chronic kidney disease, or unspecified chronic kidney disease: Secondary | ICD-10-CM | POA: Diagnosis not present

## 2020-03-24 DIAGNOSIS — M25462 Effusion, left knee: Secondary | ICD-10-CM | POA: Diagnosis not present

## 2020-03-24 DIAGNOSIS — Z79899 Other long term (current) drug therapy: Secondary | ICD-10-CM | POA: Diagnosis not present

## 2020-03-24 DIAGNOSIS — M11262 Other chondrocalcinosis, left knee: Secondary | ICD-10-CM | POA: Diagnosis not present

## 2020-03-24 DIAGNOSIS — N189 Chronic kidney disease, unspecified: Secondary | ICD-10-CM | POA: Diagnosis not present

## 2020-03-24 DIAGNOSIS — M1710 Unilateral primary osteoarthritis, unspecified knee: Secondary | ICD-10-CM | POA: Diagnosis not present

## 2020-03-24 DIAGNOSIS — K219 Gastro-esophageal reflux disease without esophagitis: Secondary | ICD-10-CM | POA: Diagnosis not present

## 2020-03-24 DIAGNOSIS — Z940 Kidney transplant status: Secondary | ICD-10-CM | POA: Diagnosis not present

## 2020-03-24 DIAGNOSIS — Z9181 History of falling: Secondary | ICD-10-CM | POA: Diagnosis not present

## 2020-03-24 DIAGNOSIS — Z7952 Long term (current) use of systemic steroids: Secondary | ICD-10-CM | POA: Diagnosis not present

## 2020-03-24 DIAGNOSIS — E785 Hyperlipidemia, unspecified: Secondary | ICD-10-CM | POA: Diagnosis not present

## 2020-03-24 DIAGNOSIS — R55 Syncope and collapse: Secondary | ICD-10-CM | POA: Diagnosis not present

## 2020-03-30 DIAGNOSIS — E785 Hyperlipidemia, unspecified: Secondary | ICD-10-CM | POA: Diagnosis not present

## 2020-03-30 DIAGNOSIS — R55 Syncope and collapse: Secondary | ICD-10-CM | POA: Diagnosis not present

## 2020-03-30 DIAGNOSIS — I129 Hypertensive chronic kidney disease with stage 1 through stage 4 chronic kidney disease, or unspecified chronic kidney disease: Secondary | ICD-10-CM | POA: Diagnosis not present

## 2020-03-30 DIAGNOSIS — Z9181 History of falling: Secondary | ICD-10-CM | POA: Diagnosis not present

## 2020-03-30 DIAGNOSIS — I951 Orthostatic hypotension: Secondary | ICD-10-CM | POA: Diagnosis not present

## 2020-03-30 DIAGNOSIS — K219 Gastro-esophageal reflux disease without esophagitis: Secondary | ICD-10-CM | POA: Diagnosis not present

## 2020-03-30 DIAGNOSIS — Z940 Kidney transplant status: Secondary | ICD-10-CM | POA: Diagnosis not present

## 2020-03-30 DIAGNOSIS — N189 Chronic kidney disease, unspecified: Secondary | ICD-10-CM | POA: Diagnosis not present

## 2020-03-30 DIAGNOSIS — M11262 Other chondrocalcinosis, left knee: Secondary | ICD-10-CM | POA: Diagnosis not present

## 2020-03-30 DIAGNOSIS — Z7952 Long term (current) use of systemic steroids: Secondary | ICD-10-CM | POA: Diagnosis not present

## 2020-03-30 DIAGNOSIS — M1710 Unilateral primary osteoarthritis, unspecified knee: Secondary | ICD-10-CM | POA: Diagnosis not present

## 2020-03-30 DIAGNOSIS — Z79899 Other long term (current) drug therapy: Secondary | ICD-10-CM | POA: Diagnosis not present

## 2020-03-30 DIAGNOSIS — M25462 Effusion, left knee: Secondary | ICD-10-CM | POA: Diagnosis not present

## 2020-04-06 DIAGNOSIS — E785 Hyperlipidemia, unspecified: Secondary | ICD-10-CM | POA: Diagnosis not present

## 2020-04-06 DIAGNOSIS — M11262 Other chondrocalcinosis, left knee: Secondary | ICD-10-CM | POA: Diagnosis not present

## 2020-04-06 DIAGNOSIS — Z79899 Other long term (current) drug therapy: Secondary | ICD-10-CM | POA: Diagnosis not present

## 2020-04-06 DIAGNOSIS — R55 Syncope and collapse: Secondary | ICD-10-CM | POA: Diagnosis not present

## 2020-04-06 DIAGNOSIS — M1710 Unilateral primary osteoarthritis, unspecified knee: Secondary | ICD-10-CM | POA: Diagnosis not present

## 2020-04-06 DIAGNOSIS — Z7952 Long term (current) use of systemic steroids: Secondary | ICD-10-CM | POA: Diagnosis not present

## 2020-04-06 DIAGNOSIS — M25462 Effusion, left knee: Secondary | ICD-10-CM | POA: Diagnosis not present

## 2020-04-06 DIAGNOSIS — Z9181 History of falling: Secondary | ICD-10-CM | POA: Diagnosis not present

## 2020-04-06 DIAGNOSIS — N189 Chronic kidney disease, unspecified: Secondary | ICD-10-CM | POA: Diagnosis not present

## 2020-04-06 DIAGNOSIS — Z940 Kidney transplant status: Secondary | ICD-10-CM | POA: Diagnosis not present

## 2020-04-06 DIAGNOSIS — I129 Hypertensive chronic kidney disease with stage 1 through stage 4 chronic kidney disease, or unspecified chronic kidney disease: Secondary | ICD-10-CM | POA: Diagnosis not present

## 2020-04-06 DIAGNOSIS — I951 Orthostatic hypotension: Secondary | ICD-10-CM | POA: Diagnosis not present

## 2020-04-06 DIAGNOSIS — K219 Gastro-esophageal reflux disease without esophagitis: Secondary | ICD-10-CM | POA: Diagnosis not present

## 2020-04-07 DIAGNOSIS — N189 Chronic kidney disease, unspecified: Secondary | ICD-10-CM | POA: Diagnosis not present

## 2020-04-07 DIAGNOSIS — Z79899 Other long term (current) drug therapy: Secondary | ICD-10-CM | POA: Diagnosis not present

## 2020-04-07 DIAGNOSIS — I951 Orthostatic hypotension: Secondary | ICD-10-CM | POA: Diagnosis not present

## 2020-04-07 DIAGNOSIS — I129 Hypertensive chronic kidney disease with stage 1 through stage 4 chronic kidney disease, or unspecified chronic kidney disease: Secondary | ICD-10-CM | POA: Diagnosis not present

## 2020-04-07 DIAGNOSIS — E785 Hyperlipidemia, unspecified: Secondary | ICD-10-CM | POA: Diagnosis not present

## 2020-04-07 DIAGNOSIS — R55 Syncope and collapse: Secondary | ICD-10-CM | POA: Diagnosis not present

## 2020-04-07 DIAGNOSIS — M11262 Other chondrocalcinosis, left knee: Secondary | ICD-10-CM | POA: Diagnosis not present

## 2020-04-07 DIAGNOSIS — M25462 Effusion, left knee: Secondary | ICD-10-CM | POA: Diagnosis not present

## 2020-04-07 DIAGNOSIS — M1710 Unilateral primary osteoarthritis, unspecified knee: Secondary | ICD-10-CM | POA: Diagnosis not present

## 2020-04-07 DIAGNOSIS — Z7952 Long term (current) use of systemic steroids: Secondary | ICD-10-CM | POA: Diagnosis not present

## 2020-04-07 DIAGNOSIS — Z940 Kidney transplant status: Secondary | ICD-10-CM | POA: Diagnosis not present

## 2020-04-07 DIAGNOSIS — Z9181 History of falling: Secondary | ICD-10-CM | POA: Diagnosis not present

## 2020-04-07 DIAGNOSIS — K219 Gastro-esophageal reflux disease without esophagitis: Secondary | ICD-10-CM | POA: Diagnosis not present

## 2020-04-11 DIAGNOSIS — I129 Hypertensive chronic kidney disease with stage 1 through stage 4 chronic kidney disease, or unspecified chronic kidney disease: Secondary | ICD-10-CM | POA: Diagnosis not present

## 2020-04-11 DIAGNOSIS — M11262 Other chondrocalcinosis, left knee: Secondary | ICD-10-CM | POA: Diagnosis not present

## 2020-04-11 DIAGNOSIS — Z79899 Other long term (current) drug therapy: Secondary | ICD-10-CM | POA: Diagnosis not present

## 2020-04-11 DIAGNOSIS — K219 Gastro-esophageal reflux disease without esophagitis: Secondary | ICD-10-CM | POA: Diagnosis not present

## 2020-04-11 DIAGNOSIS — Z7952 Long term (current) use of systemic steroids: Secondary | ICD-10-CM | POA: Diagnosis not present

## 2020-04-11 DIAGNOSIS — M25462 Effusion, left knee: Secondary | ICD-10-CM | POA: Diagnosis not present

## 2020-04-11 DIAGNOSIS — Z940 Kidney transplant status: Secondary | ICD-10-CM | POA: Diagnosis not present

## 2020-04-11 DIAGNOSIS — M1710 Unilateral primary osteoarthritis, unspecified knee: Secondary | ICD-10-CM | POA: Diagnosis not present

## 2020-04-11 DIAGNOSIS — Z9181 History of falling: Secondary | ICD-10-CM | POA: Diagnosis not present

## 2020-04-11 DIAGNOSIS — I951 Orthostatic hypotension: Secondary | ICD-10-CM | POA: Diagnosis not present

## 2020-04-11 DIAGNOSIS — N189 Chronic kidney disease, unspecified: Secondary | ICD-10-CM | POA: Diagnosis not present

## 2020-04-11 DIAGNOSIS — R55 Syncope and collapse: Secondary | ICD-10-CM | POA: Diagnosis not present

## 2020-04-11 DIAGNOSIS — E785 Hyperlipidemia, unspecified: Secondary | ICD-10-CM | POA: Diagnosis not present

## 2020-04-13 DIAGNOSIS — M25462 Effusion, left knee: Secondary | ICD-10-CM | POA: Diagnosis not present

## 2020-04-13 DIAGNOSIS — Z7952 Long term (current) use of systemic steroids: Secondary | ICD-10-CM | POA: Diagnosis not present

## 2020-04-13 DIAGNOSIS — Z9181 History of falling: Secondary | ICD-10-CM | POA: Diagnosis not present

## 2020-04-13 DIAGNOSIS — I129 Hypertensive chronic kidney disease with stage 1 through stage 4 chronic kidney disease, or unspecified chronic kidney disease: Secondary | ICD-10-CM | POA: Diagnosis not present

## 2020-04-13 DIAGNOSIS — Z79899 Other long term (current) drug therapy: Secondary | ICD-10-CM | POA: Diagnosis not present

## 2020-04-13 DIAGNOSIS — Z940 Kidney transplant status: Secondary | ICD-10-CM | POA: Diagnosis not present

## 2020-04-13 DIAGNOSIS — R55 Syncope and collapse: Secondary | ICD-10-CM | POA: Diagnosis not present

## 2020-04-13 DIAGNOSIS — M11262 Other chondrocalcinosis, left knee: Secondary | ICD-10-CM | POA: Diagnosis not present

## 2020-04-13 DIAGNOSIS — M1710 Unilateral primary osteoarthritis, unspecified knee: Secondary | ICD-10-CM | POA: Diagnosis not present

## 2020-04-13 DIAGNOSIS — E785 Hyperlipidemia, unspecified: Secondary | ICD-10-CM | POA: Diagnosis not present

## 2020-04-13 DIAGNOSIS — N189 Chronic kidney disease, unspecified: Secondary | ICD-10-CM | POA: Diagnosis not present

## 2020-04-13 DIAGNOSIS — K219 Gastro-esophageal reflux disease without esophagitis: Secondary | ICD-10-CM | POA: Diagnosis not present

## 2020-04-13 DIAGNOSIS — I951 Orthostatic hypotension: Secondary | ICD-10-CM | POA: Diagnosis not present

## 2020-04-20 DIAGNOSIS — R55 Syncope and collapse: Secondary | ICD-10-CM | POA: Diagnosis not present

## 2020-04-20 DIAGNOSIS — I129 Hypertensive chronic kidney disease with stage 1 through stage 4 chronic kidney disease, or unspecified chronic kidney disease: Secondary | ICD-10-CM | POA: Diagnosis not present

## 2020-04-20 DIAGNOSIS — N189 Chronic kidney disease, unspecified: Secondary | ICD-10-CM | POA: Diagnosis not present

## 2020-04-20 DIAGNOSIS — I951 Orthostatic hypotension: Secondary | ICD-10-CM | POA: Diagnosis not present

## 2020-04-20 DIAGNOSIS — M25462 Effusion, left knee: Secondary | ICD-10-CM | POA: Diagnosis not present

## 2020-04-20 DIAGNOSIS — M1710 Unilateral primary osteoarthritis, unspecified knee: Secondary | ICD-10-CM | POA: Diagnosis not present

## 2020-04-20 DIAGNOSIS — Z940 Kidney transplant status: Secondary | ICD-10-CM | POA: Diagnosis not present

## 2020-04-20 DIAGNOSIS — Z7952 Long term (current) use of systemic steroids: Secondary | ICD-10-CM | POA: Diagnosis not present

## 2020-04-20 DIAGNOSIS — E785 Hyperlipidemia, unspecified: Secondary | ICD-10-CM | POA: Diagnosis not present

## 2020-04-20 DIAGNOSIS — K219 Gastro-esophageal reflux disease without esophagitis: Secondary | ICD-10-CM | POA: Diagnosis not present

## 2020-04-20 DIAGNOSIS — Z79899 Other long term (current) drug therapy: Secondary | ICD-10-CM | POA: Diagnosis not present

## 2020-04-20 DIAGNOSIS — M11262 Other chondrocalcinosis, left knee: Secondary | ICD-10-CM | POA: Diagnosis not present

## 2020-04-20 DIAGNOSIS — Z9181 History of falling: Secondary | ICD-10-CM | POA: Diagnosis not present

## 2020-05-04 DIAGNOSIS — Z940 Kidney transplant status: Secondary | ICD-10-CM | POA: Diagnosis not present

## 2020-05-04 DIAGNOSIS — B338 Other specified viral diseases: Secondary | ICD-10-CM | POA: Diagnosis not present

## 2020-05-06 DIAGNOSIS — Z7952 Long term (current) use of systemic steroids: Secondary | ICD-10-CM | POA: Diagnosis not present

## 2020-05-06 DIAGNOSIS — M1710 Unilateral primary osteoarthritis, unspecified knee: Secondary | ICD-10-CM | POA: Diagnosis not present

## 2020-05-06 DIAGNOSIS — I951 Orthostatic hypotension: Secondary | ICD-10-CM | POA: Diagnosis not present

## 2020-05-06 DIAGNOSIS — N390 Urinary tract infection, site not specified: Secondary | ICD-10-CM | POA: Diagnosis not present

## 2020-05-06 DIAGNOSIS — R55 Syncope and collapse: Secondary | ICD-10-CM | POA: Diagnosis not present

## 2020-05-06 DIAGNOSIS — Z940 Kidney transplant status: Secondary | ICD-10-CM | POA: Diagnosis not present

## 2020-05-06 DIAGNOSIS — M25462 Effusion, left knee: Secondary | ICD-10-CM | POA: Diagnosis not present

## 2020-05-06 DIAGNOSIS — E785 Hyperlipidemia, unspecified: Secondary | ICD-10-CM | POA: Diagnosis not present

## 2020-05-06 DIAGNOSIS — I129 Hypertensive chronic kidney disease with stage 1 through stage 4 chronic kidney disease, or unspecified chronic kidney disease: Secondary | ICD-10-CM | POA: Diagnosis not present

## 2020-05-06 DIAGNOSIS — Z9181 History of falling: Secondary | ICD-10-CM | POA: Diagnosis not present

## 2020-05-06 DIAGNOSIS — N189 Chronic kidney disease, unspecified: Secondary | ICD-10-CM | POA: Diagnosis not present

## 2020-05-06 DIAGNOSIS — Z79899 Other long term (current) drug therapy: Secondary | ICD-10-CM | POA: Diagnosis not present

## 2020-05-06 DIAGNOSIS — M11262 Other chondrocalcinosis, left knee: Secondary | ICD-10-CM | POA: Diagnosis not present

## 2020-05-06 DIAGNOSIS — K219 Gastro-esophageal reflux disease without esophagitis: Secondary | ICD-10-CM | POA: Diagnosis not present

## 2020-05-09 DIAGNOSIS — Z7952 Long term (current) use of systemic steroids: Secondary | ICD-10-CM | POA: Diagnosis not present

## 2020-05-09 DIAGNOSIS — N189 Chronic kidney disease, unspecified: Secondary | ICD-10-CM | POA: Diagnosis not present

## 2020-05-09 DIAGNOSIS — Z9181 History of falling: Secondary | ICD-10-CM | POA: Diagnosis not present

## 2020-05-09 DIAGNOSIS — Z79899 Other long term (current) drug therapy: Secondary | ICD-10-CM | POA: Diagnosis not present

## 2020-05-09 DIAGNOSIS — M11262 Other chondrocalcinosis, left knee: Secondary | ICD-10-CM | POA: Diagnosis not present

## 2020-05-09 DIAGNOSIS — Z940 Kidney transplant status: Secondary | ICD-10-CM | POA: Diagnosis not present

## 2020-05-09 DIAGNOSIS — M1710 Unilateral primary osteoarthritis, unspecified knee: Secondary | ICD-10-CM | POA: Diagnosis not present

## 2020-05-09 DIAGNOSIS — E785 Hyperlipidemia, unspecified: Secondary | ICD-10-CM | POA: Diagnosis not present

## 2020-05-09 DIAGNOSIS — I951 Orthostatic hypotension: Secondary | ICD-10-CM | POA: Diagnosis not present

## 2020-05-09 DIAGNOSIS — M25462 Effusion, left knee: Secondary | ICD-10-CM | POA: Diagnosis not present

## 2020-05-09 DIAGNOSIS — I129 Hypertensive chronic kidney disease with stage 1 through stage 4 chronic kidney disease, or unspecified chronic kidney disease: Secondary | ICD-10-CM | POA: Diagnosis not present

## 2020-05-09 DIAGNOSIS — R55 Syncope and collapse: Secondary | ICD-10-CM | POA: Diagnosis not present

## 2020-05-09 DIAGNOSIS — K219 Gastro-esophageal reflux disease without esophagitis: Secondary | ICD-10-CM | POA: Diagnosis not present

## 2020-05-19 DIAGNOSIS — I129 Hypertensive chronic kidney disease with stage 1 through stage 4 chronic kidney disease, or unspecified chronic kidney disease: Secondary | ICD-10-CM | POA: Diagnosis not present

## 2020-05-19 DIAGNOSIS — K219 Gastro-esophageal reflux disease without esophagitis: Secondary | ICD-10-CM | POA: Diagnosis not present

## 2020-05-19 DIAGNOSIS — E785 Hyperlipidemia, unspecified: Secondary | ICD-10-CM | POA: Diagnosis not present

## 2020-05-19 DIAGNOSIS — I951 Orthostatic hypotension: Secondary | ICD-10-CM | POA: Diagnosis not present

## 2020-05-19 DIAGNOSIS — Z79899 Other long term (current) drug therapy: Secondary | ICD-10-CM | POA: Diagnosis not present

## 2020-05-19 DIAGNOSIS — Z7952 Long term (current) use of systemic steroids: Secondary | ICD-10-CM | POA: Diagnosis not present

## 2020-05-19 DIAGNOSIS — M25462 Effusion, left knee: Secondary | ICD-10-CM | POA: Diagnosis not present

## 2020-05-19 DIAGNOSIS — R55 Syncope and collapse: Secondary | ICD-10-CM | POA: Diagnosis not present

## 2020-05-19 DIAGNOSIS — M1710 Unilateral primary osteoarthritis, unspecified knee: Secondary | ICD-10-CM | POA: Diagnosis not present

## 2020-05-19 DIAGNOSIS — Z940 Kidney transplant status: Secondary | ICD-10-CM | POA: Diagnosis not present

## 2020-05-19 DIAGNOSIS — Z9181 History of falling: Secondary | ICD-10-CM | POA: Diagnosis not present

## 2020-05-19 DIAGNOSIS — M11262 Other chondrocalcinosis, left knee: Secondary | ICD-10-CM | POA: Diagnosis not present

## 2020-05-19 DIAGNOSIS — N189 Chronic kidney disease, unspecified: Secondary | ICD-10-CM | POA: Diagnosis not present

## 2020-08-03 DIAGNOSIS — Z940 Kidney transplant status: Secondary | ICD-10-CM | POA: Diagnosis not present

## 2020-09-24 ENCOUNTER — Emergency Department (HOSPITAL_BASED_OUTPATIENT_CLINIC_OR_DEPARTMENT_OTHER): Payer: Medicare Other

## 2020-09-24 ENCOUNTER — Emergency Department (HOSPITAL_BASED_OUTPATIENT_CLINIC_OR_DEPARTMENT_OTHER)
Admission: EM | Admit: 2020-09-24 | Discharge: 2020-09-24 | Disposition: A | Discharge status: Home / Self Care | Payer: Medicare Other | Attending: Emergency Medicine | Admitting: Emergency Medicine

## 2020-09-24 ENCOUNTER — Encounter (HOSPITAL_BASED_OUTPATIENT_CLINIC_OR_DEPARTMENT_OTHER): Payer: Self-pay

## 2020-09-24 ENCOUNTER — Other Ambulatory Visit: Payer: Self-pay

## 2020-09-24 DIAGNOSIS — J069 Acute upper respiratory infection, unspecified: Secondary | ICD-10-CM | POA: Insufficient documentation

## 2020-09-24 DIAGNOSIS — B9789 Other viral agents as the cause of diseases classified elsewhere: Secondary | ICD-10-CM | POA: Diagnosis not present

## 2020-09-24 DIAGNOSIS — Z7982 Long term (current) use of aspirin: Secondary | ICD-10-CM | POA: Insufficient documentation

## 2020-09-24 DIAGNOSIS — Z79899 Other long term (current) drug therapy: Secondary | ICD-10-CM | POA: Diagnosis not present

## 2020-09-24 DIAGNOSIS — N189 Chronic kidney disease, unspecified: Secondary | ICD-10-CM | POA: Insufficient documentation

## 2020-09-24 DIAGNOSIS — U071 COVID-19: Secondary | ICD-10-CM | POA: Diagnosis not present

## 2020-09-24 DIAGNOSIS — R059 Cough, unspecified: Secondary | ICD-10-CM | POA: Diagnosis not present

## 2020-09-24 DIAGNOSIS — I129 Hypertensive chronic kidney disease with stage 1 through stage 4 chronic kidney disease, or unspecified chronic kidney disease: Secondary | ICD-10-CM | POA: Diagnosis not present

## 2020-09-24 LAB — RESP PANEL BY RT-PCR (FLU A&B, COVID) ARPGX2
Influenza A by PCR: NEGATIVE
Influenza B by PCR: NEGATIVE
SARS Coronavirus 2 by RT PCR: POSITIVE — AB

## 2020-09-24 MED ORDER — PREDNISONE 20 MG PO TABS
40.0000 mg | ORAL_TABLET | Freq: Once | ORAL | Status: AC
  Administered 2020-09-24: 40 mg via ORAL
  Filled 2020-09-24: qty 2

## 2020-09-24 MED ORDER — PREDNISONE 20 MG PO TABS: ORAL_TABLET | ORAL | 0 refills | Status: AC

## 2020-09-24 MED ORDER — AZITHROMYCIN 250 MG PO TABS: ORAL_TABLET | ORAL | 0 refills | Status: AC

## 2020-09-24 MED ORDER — ALBUTEROL SULFATE HFA 108 (90 BASE) MCG/ACT IN AERS
2.0000 | INHALATION_SPRAY | Freq: Once | RESPIRATORY_TRACT | Status: AC
  Administered 2020-09-24: 2 via RESPIRATORY_TRACT
  Filled 2020-09-24: qty 6.7

## 2020-09-24 NOTE — ED Notes (Signed)
RT staff at bedside

## 2020-09-24 NOTE — ED Notes (Signed)
Covid Swab obtained

## 2020-09-24 NOTE — ED Triage Notes (Signed)
Pt c/o "mucus in his throat" x 2 weeks. Also states he has been coughing more. States at night it is hard for him to breathe when he coughs. Been taking mucinex with some relief. Denies chest pain. C/o feet swelling

## 2020-09-24 NOTE — Discharge Instructions (Addendum)
Take tylenol 2 pills 4 times a day.    Return for worsening shortness of breath, headache, confusion. Follow up with your family doctor.

## 2020-09-24 NOTE — ED Provider Notes (Signed)
[facility] Provider Note   CSN: [id number] Arrival date & time: 09/24/20  [id number]     History Chief Complaint  Patient presents with   Cough    [name] is a 80 y.o. male.  79 yo M with a chief complaints of a cough.  This been going on for about a month now.  Nonproductive no shortness of breath no fevers.  Has some mild discomfort when he coughs.  Has chronic lower extremity edema that he feels is unchanged.  The history is provided by the patient and the spouse.  Cough Associated symptoms: chest pain (with coughing)   Associated symptoms: no chills, no eye discharge, no fever, no headaches, no myalgias, no rash and no shortness of breath   Illness Severity:  Moderate Onset quality:  Gradual Duration:  1 month Timing:  Constant Progression:  Worsening Chronicity:  New Associated symptoms: chest pain (with coughing) and cough   Associated symptoms: no abdominal pain, no congestion, no diarrhea, no fever, no headaches, no myalgias, no rash, no shortness of breath and no vomiting       Past Medical History:  Diagnosis Date   Chronic kidney disease    HX OF KIDNEY TRANSPLANT 05/2011   GERD (gastroesophageal reflux disease)    Gout    H/O kidney transplant    Hypertension    Trauma to eye, right 05/18/2013    Patient Active Problem List   Diagnosis Date Noted   Unilateral primary osteoarthritis, left knee 02/17/2020   Acute respiratory distress 05/23/2018   Influenza A 05/23/2018   Fall 05/20/2013   Traumatic subdural hematoma ([place]) 05/20/2013   Gout 05/20/2013   Hyperlipidemia 05/20/2013   HTN (hypertension) 05/20/2013   History of renal transplant 05/20/2013   GERD (gastroesophageal reflux disease) 05/20/2013   Acute alcohol intoxication ([place]) 05/20/2013   Traumatic orbital hematoma 05/18/2013   Subconjunctival hemorrhage of right eye 05/18/2013    Past Surgical History:  Procedure Laterality Date   CHOLECYSTECTOMY      KIDNEY TRANSPLANT         Family History  Problem Relation Age of Onset   Arthritis Mother    Heart disease Mother     Social History   Tobacco Use   Smoking status: Never   Smokeless tobacco: Never  Substance Use Topics   Alcohol use: No   Drug use: No    Home Medications Prior to Admission medications   Medication Sig Start Date End Date Taking? Authorizing Provider  azithromycin (ZITHROMAX Z-PAK) 250 MG tablet 2 pills by mouth day one, then one pill a day by mouth until complete 09/24/20  Yes Deno Etienne, DO  predniSONE (DELTASONE) 20 MG tablet 2 tabs po daily x 4 days 09/24/20  Yes Deno Etienne, DO  allopurinol (ZYLOPRIM) 100 MG tablet Take 100 mg by mouth daily.    [provider]  amLODipine (NORVASC) 10 MG tablet Take 10 mg by mouth daily.  03/31/18   [provider]  aspirin 81 MG chewable tablet Chew 81 mg by mouth daily.    [provider]  calcitRIOL (ROCALTROL) 0.5 MCG capsule Take 0.5 mcg by mouth every evening.  03/31/18   [provider]  dextromethorphan-guaiFENesin (MUCINEX DM) 30-600 MG 12hr tablet Take 1 tablet by mouth 2 (two) times daily as needed for cough. 05/26/18   Aline August, MD  HYDROcodone-acetaminophen (NORCO/VICODIN) 5-325 MG tablet Take 1-2 tablets by mouth every 6 (six) hours as needed for moderate pain.  01/14/16   Fredia Sorrow, MD  K Phos Mono-Sod Phos Di & Mono (PHOSPHA 250 NEUTRAL) [phone] MG TABS Take 1 tablet by mouth daily.  05/07/18   [provider]  magnesium oxide (MAG-OX) 400 MG tablet Take 400 mg by mouth 2 (two) times daily.  05/07/18   [provider]  mycophenolate (MYFORTIC) 180 MG EC tablet Take 360 mg by mouth 2 (two) times daily.    [provider]  ondansetron (ZOFRAN) 4 MG tablet Take 1 tablet (4 mg total) by mouth every 6 (six) hours as needed for nausea. 05/26/18   Aline August, MD  ranitidine (ZANTAC) 75 MG tablet Take 75 mg by mouth daily.    [provider]  simvastatin (ZOCOR) 20 MG tablet Take 20 mg by mouth every evening.    [provider]  sodium bicarbonate 650 MG tablet Take 650 mg by mouth 3 (three) times daily.    [provider]  sulfamethoxazole-trimethoprim (BACTRIM,SEPTRA) 400-80 MG per tablet Take 1 tablet by mouth 3 (three) times a week. Monday, Wednesday and Friday    [provider]  tacrolimus (PROGRAF) 1 MG capsule Take 4 mg by mouth 2 (two) times daily.     [provider]    Allergies    Patient has no known allergies.  Review of Systems   Review of Systems  Constitutional:  Negative for chills and fever.  HENT:  Negative for congestion and facial swelling.   Eyes:  Negative for discharge and visual disturbance.  Respiratory:  Positive for cough. Negative for shortness of breath.   Cardiovascular:  Positive for chest pain (with coughing). Negative for palpitations.  Gastrointestinal:  Negative for abdominal pain, diarrhea and vomiting.  Musculoskeletal:  Negative for arthralgias and myalgias.  Skin:  Negative for color change and rash.  Neurological:  Negative for tremors, syncope and headaches.  Psychiatric/Behavioral:  Negative for confusion and dysphoric mood.    Physical Exam Updated Vital Signs BP (!) 152/103   Pulse 95   Temp 98.2 F (36.8 C) (Oral)   Resp 20   Ht '5\' 6"'$  (1.676 m)   Wt 81.6 kg   SpO2 96%   BMI 29.05 kg/m   Physical Exam Vitals and nursing note reviewed.  Constitutional:      Appearance: He is well-developed.  HENT:     Head: Normocephalic and atraumatic.  Eyes:     Pupils: Pupils are equal, round, and reactive to light.  Neck:     Vascular: No JVD.  Cardiovascular:     Rate and Rhythm: Normal rate and regular rhythm.     Heart sounds: No murmur heard.   No friction rub. No gallop.  Pulmonary:     Effort: No respiratory distress.     Breath sounds: Wheezing present.     Comments: Coarse breath sounds with prolonged expiratory  efforts and end expiratory wheezes.  Worse in the bases than the apices. Abdominal:     General: There is no distension.     Tenderness: There is no abdominal tenderness. There is no guarding or rebound.  Musculoskeletal:        General: Normal range of motion.     Cervical back: Normal range of motion and neck supple.     Comments: Skin changes consistent with chronic lower extremity edema.  3+ pitting up to the knees.  Skin:    Coloration: Skin is not pale.     Findings: No rash.  Neurological:  Mental Status: He is alert and oriented to person, place, and time.  Psychiatric:        Behavior: Behavior normal.    ED Results / Procedures / Treatments   Labs (all labs ordered are listed, but only abnormal results are displayed) Labs Reviewed  RESP PANEL BY RT-PCR (FLU A&B, COVID) [id number]    EKG None  Radiology DG Chest Port 1 View  Result Date: 09/24/2020 CLINICAL DATA:  Cough for a month. EXAM: PORTABLE CHEST 1 VIEW COMPARISON:  October 02, 2018 FINDINGS: Elevation of the right hemidiaphragm is stable. No pneumothorax. The lungs are clear. The cardiomediastinal silhouette is stable. No other acute abnormalities. IMPRESSION: No active disease. Electronically Signed   By: Dorise Bullion III M.D   On: 09/24/2020 10:25    Procedures Procedures   Medications Ordered in ED Medications  predniSONE (DELTASONE) tablet 40 mg (has no administration in time range)  albuterol (VENTOLIN HFA) 108 (90 Base) MCG/ACT inhaler 2 puff (2 puffs Inhalation Given 09/24/20 0931)    ED Course  I have reviewed the triage vital signs and the nursing notes.  Pertinent labs & imaging results that were available during my care of the patient were reviewed by me and considered in my medical decision making (see chart for details).    MDM Rules/Calculators/A&P                          80 yo M with a chief complaints of cough.  Going on for a month now.  Wheezes noted on my exam.  Will do a trial of  albuterol.  Chest x-ray.  Reassess.  Chest x-ray viewed by me without focal infiltrate.  Patient has a chronically elevated right hemidiaphragm.  Patient feeling mildly better after albuterol.  We will do a burst of steroids.  Z-Pak for possible pertussis.  PCP follow-up.  10:33 AM:  I have discussed the diagnosis/risks/treatment options with the patient and family and believe the pt to be eligible for discharge home to follow-up with PCP. We also discussed returning to the ED immediately if new or worsening sx occur. We discussed the sx which are most concerning (e.g., sudden worsening pain, fever, inability to tolerate by mouth) that necessitate immediate return. Medications administered to the patient during their visit and any new prescriptions provided to the patient are listed below.  Medications given during this visit Medications  predniSONE (DELTASONE) tablet 40 mg (has no administration in time range)  albuterol (VENTOLIN HFA) 108 (90 Base) MCG/ACT inhaler 2 puff (2 puffs Inhalation Given 09/24/20 0931)     The patient appears reasonably screen and/or stabilized for discharge and I doubt any other medical condition or other [facility] requiring further screening, evaluation, or treatment in the ED at this time prior to discharge.   Final Clinical Impression(s) / ED Diagnoses Final diagnoses:  Viral URI with cough    Rx / DC Orders ED Discharge Orders          Ordered    predniSONE (DELTASONE) 20 MG tablet        09/24/20 1032    azithromycin (ZITHROMAX Z-PAK) 250 MG tablet        09/24/20 Mitchell, Reubens, DO 09/24/20 1034

## 2020-09-24 NOTE — ED Notes (Signed)
ED Provider at bedside. 

## 2020-09-26 ENCOUNTER — Telehealth (HOSPITAL_COMMUNITY): Payer: Self-pay

## 2020-10-13 DIAGNOSIS — M1712 Unilateral primary osteoarthritis, left knee: Secondary | ICD-10-CM | POA: Diagnosis not present

## 2020-10-13 DIAGNOSIS — E7800 Pure hypercholesterolemia, unspecified: Secondary | ICD-10-CM | POA: Diagnosis not present

## 2020-10-13 DIAGNOSIS — E538 Deficiency of other specified B group vitamins: Secondary | ICD-10-CM | POA: Diagnosis not present

## 2020-10-13 DIAGNOSIS — I10 Essential (primary) hypertension: Secondary | ICD-10-CM | POA: Diagnosis not present

## 2020-10-13 DIAGNOSIS — R052 Subacute cough: Secondary | ICD-10-CM | POA: Diagnosis not present

## 2020-10-13 DIAGNOSIS — Z0001 Encounter for general adult medical examination with abnormal findings: Secondary | ICD-10-CM | POA: Diagnosis not present

## 2020-10-13 DIAGNOSIS — R7303 Prediabetes: Secondary | ICD-10-CM | POA: Diagnosis not present

## 2020-10-13 DIAGNOSIS — E559 Vitamin D deficiency, unspecified: Secondary | ICD-10-CM | POA: Diagnosis not present

## 2020-10-13 DIAGNOSIS — I119 Hypertensive heart disease without heart failure: Secondary | ICD-10-CM | POA: Diagnosis not present

## 2020-10-23 DIAGNOSIS — E161 Other hypoglycemia: Secondary | ICD-10-CM | POA: Diagnosis not present

## 2020-10-23 DIAGNOSIS — I4510 Unspecified right bundle-branch block: Secondary | ICD-10-CM | POA: Diagnosis not present

## 2020-10-23 DIAGNOSIS — I10 Essential (primary) hypertension: Secondary | ICD-10-CM | POA: Diagnosis not present

## 2020-10-23 DIAGNOSIS — Z79899 Other long term (current) drug therapy: Secondary | ICD-10-CM | POA: Diagnosis not present

## 2020-10-23 DIAGNOSIS — Z940 Kidney transplant status: Secondary | ICD-10-CM | POA: Diagnosis not present

## 2020-10-23 DIAGNOSIS — R7989 Other specified abnormal findings of blood chemistry: Secondary | ICD-10-CM | POA: Diagnosis not present

## 2020-10-23 DIAGNOSIS — R0902 Hypoxemia: Secondary | ICD-10-CM | POA: Diagnosis not present

## 2020-10-23 DIAGNOSIS — N179 Acute kidney failure, unspecified: Secondary | ICD-10-CM | POA: Diagnosis not present

## 2020-10-23 DIAGNOSIS — Z743 Need for continuous supervision: Secondary | ICD-10-CM | POA: Diagnosis not present

## 2020-10-23 DIAGNOSIS — M7989 Other specified soft tissue disorders: Secondary | ICD-10-CM | POA: Diagnosis not present

## 2020-10-23 DIAGNOSIS — R509 Fever, unspecified: Secondary | ICD-10-CM | POA: Diagnosis not present

## 2020-10-23 DIAGNOSIS — Z7952 Long term (current) use of systemic steroids: Secondary | ICD-10-CM | POA: Diagnosis not present

## 2020-10-23 DIAGNOSIS — T8619 Other complication of kidney transplant: Secondary | ICD-10-CM | POA: Diagnosis not present

## 2020-10-23 DIAGNOSIS — M1712 Unilateral primary osteoarthritis, left knee: Secondary | ICD-10-CM | POA: Diagnosis not present

## 2020-10-23 DIAGNOSIS — U071 COVID-19: Secondary | ICD-10-CM | POA: Diagnosis not present

## 2020-10-23 DIAGNOSIS — E162 Hypoglycemia, unspecified: Secondary | ICD-10-CM | POA: Diagnosis not present

## 2020-10-23 DIAGNOSIS — R404 Transient alteration of awareness: Secondary | ICD-10-CM | POA: Diagnosis not present

## 2020-10-23 DIAGNOSIS — I700 Atherosclerosis of aorta: Secondary | ICD-10-CM | POA: Diagnosis not present

## 2020-10-23 DIAGNOSIS — R0602 Shortness of breath: Secondary | ICD-10-CM | POA: Diagnosis not present

## 2020-10-23 DIAGNOSIS — I4891 Unspecified atrial fibrillation: Secondary | ICD-10-CM | POA: Diagnosis not present

## 2020-10-23 DIAGNOSIS — E876 Hypokalemia: Secondary | ICD-10-CM | POA: Diagnosis not present

## 2020-10-24 DIAGNOSIS — Z7952 Long term (current) use of systemic steroids: Secondary | ICD-10-CM | POA: Diagnosis not present

## 2020-10-24 DIAGNOSIS — M7989 Other specified soft tissue disorders: Secondary | ICD-10-CM | POA: Diagnosis not present

## 2020-10-24 DIAGNOSIS — E876 Hypokalemia: Secondary | ICD-10-CM | POA: Diagnosis not present

## 2020-10-24 DIAGNOSIS — U071 COVID-19: Secondary | ICD-10-CM | POA: Diagnosis not present

## 2020-10-24 DIAGNOSIS — R7989 Other specified abnormal findings of blood chemistry: Secondary | ICD-10-CM | POA: Diagnosis not present

## 2020-10-24 DIAGNOSIS — I120 Hypertensive chronic kidney disease with stage 5 chronic kidney disease or end stage renal disease: Secondary | ICD-10-CM | POA: Diagnosis not present

## 2020-10-24 DIAGNOSIS — I4891 Unspecified atrial fibrillation: Secondary | ICD-10-CM | POA: Diagnosis not present

## 2020-10-24 DIAGNOSIS — I10 Essential (primary) hypertension: Secondary | ICD-10-CM | POA: Diagnosis not present

## 2020-10-24 DIAGNOSIS — Z79899 Other long term (current) drug therapy: Secondary | ICD-10-CM | POA: Diagnosis not present

## 2020-10-24 DIAGNOSIS — T8619 Other complication of kidney transplant: Secondary | ICD-10-CM | POA: Diagnosis not present

## 2020-10-24 DIAGNOSIS — I700 Atherosclerosis of aorta: Secondary | ICD-10-CM | POA: Diagnosis not present

## 2020-10-24 DIAGNOSIS — I4510 Unspecified right bundle-branch block: Secondary | ICD-10-CM | POA: Diagnosis not present

## 2020-10-24 DIAGNOSIS — M1712 Unilateral primary osteoarthritis, left knee: Secondary | ICD-10-CM | POA: Diagnosis not present

## 2020-10-24 DIAGNOSIS — Z940 Kidney transplant status: Secondary | ICD-10-CM | POA: Diagnosis not present

## 2020-10-24 DIAGNOSIS — N179 Acute kidney failure, unspecified: Secondary | ICD-10-CM | POA: Diagnosis not present

## 2020-10-24 DIAGNOSIS — N186 End stage renal disease: Secondary | ICD-10-CM | POA: Diagnosis not present

## 2020-10-25 DIAGNOSIS — M1712 Unilateral primary osteoarthritis, left knee: Secondary | ICD-10-CM | POA: Diagnosis not present

## 2020-10-25 DIAGNOSIS — N186 End stage renal disease: Secondary | ICD-10-CM | POA: Diagnosis not present

## 2020-10-25 DIAGNOSIS — I4891 Unspecified atrial fibrillation: Secondary | ICD-10-CM | POA: Diagnosis not present

## 2020-10-25 DIAGNOSIS — I129 Hypertensive chronic kidney disease with stage 1 through stage 4 chronic kidney disease, or unspecified chronic kidney disease: Secondary | ICD-10-CM | POA: Diagnosis not present

## 2020-10-25 DIAGNOSIS — Z940 Kidney transplant status: Secondary | ICD-10-CM | POA: Diagnosis not present

## 2020-10-25 DIAGNOSIS — Z79899 Other long term (current) drug therapy: Secondary | ICD-10-CM | POA: Diagnosis not present

## 2020-10-25 DIAGNOSIS — U071 COVID-19: Secondary | ICD-10-CM | POA: Diagnosis not present

## 2020-10-26 DIAGNOSIS — I4891 Unspecified atrial fibrillation: Secondary | ICD-10-CM | POA: Diagnosis not present

## 2020-10-26 DIAGNOSIS — I482 Chronic atrial fibrillation, unspecified: Secondary | ICD-10-CM | POA: Diagnosis not present

## 2020-10-26 DIAGNOSIS — N179 Acute kidney failure, unspecified: Secondary | ICD-10-CM | POA: Diagnosis not present

## 2020-10-26 DIAGNOSIS — N189 Chronic kidney disease, unspecified: Secondary | ICD-10-CM | POA: Diagnosis not present

## 2020-10-26 DIAGNOSIS — I493 Ventricular premature depolarization: Secondary | ICD-10-CM | POA: Diagnosis not present

## 2020-10-26 DIAGNOSIS — E876 Hypokalemia: Secondary | ICD-10-CM | POA: Diagnosis not present

## 2020-10-26 DIAGNOSIS — I4510 Unspecified right bundle-branch block: Secondary | ICD-10-CM | POA: Diagnosis not present

## 2020-10-26 DIAGNOSIS — Z940 Kidney transplant status: Secondary | ICD-10-CM | POA: Diagnosis not present

## 2020-10-26 DIAGNOSIS — U071 COVID-19: Secondary | ICD-10-CM | POA: Diagnosis not present

## 2020-10-28 DIAGNOSIS — E785 Hyperlipidemia, unspecified: Secondary | ICD-10-CM | POA: Diagnosis not present

## 2020-10-28 DIAGNOSIS — M109 Gout, unspecified: Secondary | ICD-10-CM | POA: Diagnosis not present

## 2020-10-28 DIAGNOSIS — N189 Chronic kidney disease, unspecified: Secondary | ICD-10-CM | POA: Diagnosis not present

## 2020-10-28 DIAGNOSIS — M19012 Primary osteoarthritis, left shoulder: Secondary | ICD-10-CM | POA: Diagnosis not present

## 2020-10-28 DIAGNOSIS — I129 Hypertensive chronic kidney disease with stage 1 through stage 4 chronic kidney disease, or unspecified chronic kidney disease: Secondary | ICD-10-CM | POA: Diagnosis not present

## 2020-10-28 DIAGNOSIS — M1610 Unilateral primary osteoarthritis, unspecified hip: Secondary | ICD-10-CM | POA: Diagnosis not present

## 2020-10-28 DIAGNOSIS — M19011 Primary osteoarthritis, right shoulder: Secondary | ICD-10-CM | POA: Diagnosis not present

## 2020-10-28 DIAGNOSIS — K219 Gastro-esophageal reflux disease without esophagitis: Secondary | ICD-10-CM | POA: Diagnosis not present

## 2020-10-28 DIAGNOSIS — I4891 Unspecified atrial fibrillation: Secondary | ICD-10-CM | POA: Diagnosis not present

## 2020-10-28 DIAGNOSIS — M47816 Spondylosis without myelopathy or radiculopathy, lumbar region: Secondary | ICD-10-CM | POA: Diagnosis not present

## 2020-10-28 DIAGNOSIS — M11262 Other chondrocalcinosis, left knee: Secondary | ICD-10-CM | POA: Diagnosis not present

## 2020-10-28 DIAGNOSIS — M12811 Other specific arthropathies, not elsewhere classified, right shoulder: Secondary | ICD-10-CM | POA: Diagnosis not present

## 2020-10-28 DIAGNOSIS — M48061 Spinal stenosis, lumbar region without neurogenic claudication: Secondary | ICD-10-CM | POA: Diagnosis not present

## 2020-10-28 DIAGNOSIS — N289 Disorder of kidney and ureter, unspecified: Secondary | ICD-10-CM | POA: Diagnosis not present

## 2020-10-28 DIAGNOSIS — Z9181 History of falling: Secondary | ICD-10-CM | POA: Diagnosis not present

## 2020-10-28 DIAGNOSIS — U071 COVID-19: Secondary | ICD-10-CM | POA: Diagnosis not present

## 2020-10-28 DIAGNOSIS — Z7952 Long term (current) use of systemic steroids: Secondary | ICD-10-CM | POA: Diagnosis not present

## 2020-10-28 DIAGNOSIS — M5416 Radiculopathy, lumbar region: Secondary | ICD-10-CM | POA: Diagnosis not present

## 2020-10-28 DIAGNOSIS — G8929 Other chronic pain: Secondary | ICD-10-CM | POA: Diagnosis not present

## 2020-10-28 DIAGNOSIS — M1712 Unilateral primary osteoarthritis, left knee: Secondary | ICD-10-CM | POA: Diagnosis not present

## 2020-10-28 DIAGNOSIS — M5136 Other intervertebral disc degeneration, lumbar region: Secondary | ICD-10-CM | POA: Diagnosis not present

## 2020-10-28 DIAGNOSIS — M4126 Other idiopathic scoliosis, lumbar region: Secondary | ICD-10-CM | POA: Diagnosis not present

## 2020-10-28 DIAGNOSIS — Z940 Kidney transplant status: Secondary | ICD-10-CM | POA: Diagnosis not present

## 2020-10-28 DIAGNOSIS — I739 Peripheral vascular disease, unspecified: Secondary | ICD-10-CM | POA: Diagnosis not present

## 2020-10-30 DIAGNOSIS — I10 Essential (primary) hypertension: Secondary | ICD-10-CM | POA: Diagnosis not present

## 2020-10-30 DIAGNOSIS — N179 Acute kidney failure, unspecified: Secondary | ICD-10-CM | POA: Diagnosis not present

## 2020-10-30 DIAGNOSIS — I4891 Unspecified atrial fibrillation: Secondary | ICD-10-CM | POA: Diagnosis not present

## 2020-10-30 DIAGNOSIS — Z940 Kidney transplant status: Secondary | ICD-10-CM | POA: Diagnosis not present

## 2020-10-30 DIAGNOSIS — Z79899 Other long term (current) drug therapy: Secondary | ICD-10-CM | POA: Diagnosis not present

## 2020-10-30 DIAGNOSIS — U071 COVID-19: Secondary | ICD-10-CM | POA: Diagnosis not present

## 2020-10-31 DIAGNOSIS — I959 Hypotension, unspecified: Secondary | ICD-10-CM | POA: Diagnosis not present

## 2020-10-31 DIAGNOSIS — I4891 Unspecified atrial fibrillation: Secondary | ICD-10-CM | POA: Diagnosis not present

## 2020-10-31 DIAGNOSIS — Z79899 Other long term (current) drug therapy: Secondary | ICD-10-CM | POA: Diagnosis not present

## 2020-10-31 DIAGNOSIS — I10 Essential (primary) hypertension: Secondary | ICD-10-CM | POA: Diagnosis not present

## 2020-10-31 DIAGNOSIS — N186 End stage renal disease: Secondary | ICD-10-CM | POA: Diagnosis not present

## 2020-10-31 DIAGNOSIS — N179 Acute kidney failure, unspecified: Secondary | ICD-10-CM | POA: Diagnosis not present

## 2020-10-31 DIAGNOSIS — M329 Systemic lupus erythematosus, unspecified: Secondary | ICD-10-CM | POA: Diagnosis not present

## 2020-10-31 DIAGNOSIS — U071 COVID-19: Secondary | ICD-10-CM | POA: Diagnosis not present

## 2020-10-31 DIAGNOSIS — Z743 Need for continuous supervision: Secondary | ICD-10-CM | POA: Diagnosis not present

## 2020-10-31 DIAGNOSIS — I499 Cardiac arrhythmia, unspecified: Secondary | ICD-10-CM | POA: Diagnosis not present

## 2020-10-31 DIAGNOSIS — I120 Hypertensive chronic kidney disease with stage 5 chronic kidney disease or end stage renal disease: Secondary | ICD-10-CM | POA: Diagnosis not present

## 2020-10-31 DIAGNOSIS — R188 Other ascites: Secondary | ICD-10-CM | POA: Diagnosis not present

## 2020-10-31 DIAGNOSIS — M4126 Other idiopathic scoliosis, lumbar region: Secondary | ICD-10-CM | POA: Diagnosis not present

## 2020-10-31 DIAGNOSIS — Z940 Kidney transplant status: Secondary | ICD-10-CM | POA: Diagnosis not present

## 2020-10-31 DIAGNOSIS — M5116 Intervertebral disc disorders with radiculopathy, lumbar region: Secondary | ICD-10-CM | POA: Diagnosis not present

## 2020-10-31 DIAGNOSIS — M4726 Other spondylosis with radiculopathy, lumbar region: Secondary | ICD-10-CM | POA: Diagnosis not present

## 2020-10-31 DIAGNOSIS — N183 Chronic kidney disease, stage 3 unspecified: Secondary | ICD-10-CM | POA: Diagnosis not present

## 2020-10-31 DIAGNOSIS — I129 Hypertensive chronic kidney disease with stage 1 through stage 4 chronic kidney disease, or unspecified chronic kidney disease: Secondary | ICD-10-CM | POA: Diagnosis not present

## 2020-10-31 DIAGNOSIS — T8611 Kidney transplant rejection: Secondary | ICD-10-CM | POA: Diagnosis not present

## 2020-10-31 DIAGNOSIS — R5383 Other fatigue: Secondary | ICD-10-CM | POA: Diagnosis not present

## 2020-10-31 DIAGNOSIS — K219 Gastro-esophageal reflux disease without esophagitis: Secondary | ICD-10-CM | POA: Diagnosis not present

## 2020-10-31 DIAGNOSIS — N189 Chronic kidney disease, unspecified: Secondary | ICD-10-CM | POA: Diagnosis not present

## 2020-10-31 DIAGNOSIS — I4510 Unspecified right bundle-branch block: Secondary | ICD-10-CM | POA: Diagnosis not present

## 2020-10-31 DIAGNOSIS — Z8616 Personal history of COVID-19: Secondary | ICD-10-CM | POA: Diagnosis not present

## 2020-10-31 DIAGNOSIS — N170 Acute kidney failure with tubular necrosis: Secondary | ICD-10-CM | POA: Diagnosis not present

## 2020-10-31 DIAGNOSIS — M109 Gout, unspecified: Secondary | ICD-10-CM | POA: Diagnosis not present

## 2020-10-31 DIAGNOSIS — E860 Dehydration: Secondary | ICD-10-CM | POA: Diagnosis not present

## 2020-10-31 DIAGNOSIS — R6889 Other general symptoms and signs: Secondary | ICD-10-CM | POA: Diagnosis not present

## 2020-11-05 DIAGNOSIS — E1122 Type 2 diabetes mellitus with diabetic chronic kidney disease: Secondary | ICD-10-CM | POA: Diagnosis not present

## 2020-11-05 DIAGNOSIS — Z8616 Personal history of COVID-19: Secondary | ICD-10-CM | POA: Diagnosis not present

## 2020-11-05 DIAGNOSIS — M329 Systemic lupus erythematosus, unspecified: Secondary | ICD-10-CM | POA: Diagnosis not present

## 2020-11-05 DIAGNOSIS — R410 Disorientation, unspecified: Secondary | ICD-10-CM | POA: Diagnosis not present

## 2020-11-05 DIAGNOSIS — N179 Acute kidney failure, unspecified: Secondary | ICD-10-CM | POA: Diagnosis not present

## 2020-11-05 DIAGNOSIS — E878 Other disorders of electrolyte and fluid balance, not elsewhere classified: Secondary | ICD-10-CM | POA: Diagnosis not present

## 2020-11-05 DIAGNOSIS — Z7409 Other reduced mobility: Secondary | ICD-10-CM | POA: Diagnosis not present

## 2020-11-05 DIAGNOSIS — Z79899 Other long term (current) drug therapy: Secondary | ICD-10-CM | POA: Diagnosis not present

## 2020-11-05 DIAGNOSIS — Z743 Need for continuous supervision: Secondary | ICD-10-CM | POA: Diagnosis not present

## 2020-11-05 DIAGNOSIS — I491 Atrial premature depolarization: Secondary | ICD-10-CM | POA: Diagnosis not present

## 2020-11-05 DIAGNOSIS — E871 Hypo-osmolality and hyponatremia: Secondary | ICD-10-CM | POA: Diagnosis not present

## 2020-11-05 DIAGNOSIS — D849 Immunodeficiency, unspecified: Secondary | ICD-10-CM | POA: Diagnosis not present

## 2020-11-05 DIAGNOSIS — R2681 Unsteadiness on feet: Secondary | ICD-10-CM | POA: Diagnosis not present

## 2020-11-05 DIAGNOSIS — G9341 Metabolic encephalopathy: Secondary | ICD-10-CM | POA: Diagnosis not present

## 2020-11-05 DIAGNOSIS — I4891 Unspecified atrial fibrillation: Secondary | ICD-10-CM | POA: Diagnosis not present

## 2020-11-05 DIAGNOSIS — M109 Gout, unspecified: Secondary | ICD-10-CM | POA: Diagnosis not present

## 2020-11-05 DIAGNOSIS — E1151 Type 2 diabetes mellitus with diabetic peripheral angiopathy without gangrene: Secondary | ICD-10-CM | POA: Diagnosis not present

## 2020-11-05 DIAGNOSIS — G9340 Encephalopathy, unspecified: Secondary | ICD-10-CM | POA: Diagnosis not present

## 2020-11-05 DIAGNOSIS — K219 Gastro-esophageal reflux disease without esophagitis: Secondary | ICD-10-CM | POA: Diagnosis not present

## 2020-11-05 DIAGNOSIS — R531 Weakness: Secondary | ICD-10-CM | POA: Diagnosis not present

## 2020-11-05 DIAGNOSIS — T451X5A Adverse effect of antineoplastic and immunosuppressive drugs, initial encounter: Secondary | ICD-10-CM | POA: Diagnosis not present

## 2020-11-05 DIAGNOSIS — J1282 Pneumonia due to coronavirus disease 2019: Secondary | ICD-10-CM | POA: Diagnosis not present

## 2020-11-05 DIAGNOSIS — I4510 Unspecified right bundle-branch block: Secondary | ICD-10-CM | POA: Diagnosis not present

## 2020-11-05 DIAGNOSIS — Z7982 Long term (current) use of aspirin: Secondary | ICD-10-CM | POA: Diagnosis not present

## 2020-11-05 DIAGNOSIS — U071 COVID-19: Secondary | ICD-10-CM | POA: Diagnosis not present

## 2020-11-05 DIAGNOSIS — Z741 Need for assistance with personal care: Secondary | ICD-10-CM | POA: Diagnosis not present

## 2020-11-05 DIAGNOSIS — T50995A Adverse effect of other drugs, medicaments and biological substances, initial encounter: Secondary | ICD-10-CM | POA: Diagnosis not present

## 2020-11-05 DIAGNOSIS — Z7952 Long term (current) use of systemic steroids: Secondary | ICD-10-CM | POA: Diagnosis not present

## 2020-11-05 DIAGNOSIS — I120 Hypertensive chronic kidney disease with stage 5 chronic kidney disease or end stage renal disease: Secondary | ICD-10-CM | POA: Diagnosis not present

## 2020-11-05 DIAGNOSIS — T8619 Other complication of kidney transplant: Secondary | ICD-10-CM | POA: Diagnosis not present

## 2020-11-05 DIAGNOSIS — N186 End stage renal disease: Secondary | ICD-10-CM | POA: Diagnosis not present

## 2020-11-05 DIAGNOSIS — R5381 Other malaise: Secondary | ICD-10-CM | POA: Diagnosis not present

## 2020-11-05 DIAGNOSIS — I10 Essential (primary) hypertension: Secondary | ICD-10-CM | POA: Diagnosis not present

## 2020-11-05 DIAGNOSIS — E875 Hyperkalemia: Secondary | ICD-10-CM | POA: Diagnosis not present

## 2020-11-05 DIAGNOSIS — R7989 Other specified abnormal findings of blood chemistry: Secondary | ICD-10-CM | POA: Diagnosis not present

## 2020-11-05 DIAGNOSIS — D84821 Immunodeficiency due to drugs: Secondary | ICD-10-CM | POA: Diagnosis not present

## 2020-11-05 DIAGNOSIS — R1312 Dysphagia, oropharyngeal phase: Secondary | ICD-10-CM | POA: Diagnosis not present

## 2020-11-05 DIAGNOSIS — E785 Hyperlipidemia, unspecified: Secondary | ICD-10-CM | POA: Diagnosis not present

## 2020-11-05 DIAGNOSIS — N183 Chronic kidney disease, stage 3 unspecified: Secondary | ICD-10-CM | POA: Diagnosis not present

## 2020-11-05 DIAGNOSIS — Z940 Kidney transplant status: Secondary | ICD-10-CM | POA: Diagnosis not present

## 2020-11-05 DIAGNOSIS — M6281 Muscle weakness (generalized): Secondary | ICD-10-CM | POA: Diagnosis not present

## 2020-11-05 DIAGNOSIS — I129 Hypertensive chronic kidney disease with stage 1 through stage 4 chronic kidney disease, or unspecified chronic kidney disease: Secondary | ICD-10-CM | POA: Diagnosis not present

## 2020-11-05 DIAGNOSIS — E43 Unspecified severe protein-calorie malnutrition: Secondary | ICD-10-CM | POA: Diagnosis not present

## 2020-11-05 DIAGNOSIS — D61818 Other pancytopenia: Secondary | ICD-10-CM | POA: Diagnosis not present

## 2020-11-17 DIAGNOSIS — E871 Hypo-osmolality and hyponatremia: Secondary | ICD-10-CM | POA: Diagnosis not present

## 2020-11-17 DIAGNOSIS — M109 Gout, unspecified: Secondary | ICD-10-CM | POA: Diagnosis not present

## 2020-11-17 DIAGNOSIS — Z7409 Other reduced mobility: Secondary | ICD-10-CM | POA: Diagnosis not present

## 2020-11-17 DIAGNOSIS — Z79899 Other long term (current) drug therapy: Secondary | ICD-10-CM | POA: Diagnosis not present

## 2020-11-17 DIAGNOSIS — G9340 Encephalopathy, unspecified: Secondary | ICD-10-CM | POA: Diagnosis not present

## 2020-11-17 DIAGNOSIS — R531 Weakness: Secondary | ICD-10-CM | POA: Diagnosis not present

## 2020-11-17 DIAGNOSIS — T8619 Other complication of kidney transplant: Secondary | ICD-10-CM | POA: Diagnosis not present

## 2020-11-17 DIAGNOSIS — Z940 Kidney transplant status: Secondary | ICD-10-CM | POA: Diagnosis not present

## 2020-11-17 DIAGNOSIS — N179 Acute kidney failure, unspecified: Secondary | ICD-10-CM | POA: Diagnosis not present

## 2020-11-17 DIAGNOSIS — Z741 Need for assistance with personal care: Secondary | ICD-10-CM | POA: Diagnosis not present

## 2020-11-17 DIAGNOSIS — R1312 Dysphagia, oropharyngeal phase: Secondary | ICD-10-CM | POA: Diagnosis not present

## 2020-11-17 DIAGNOSIS — E43 Unspecified severe protein-calorie malnutrition: Secondary | ICD-10-CM | POA: Diagnosis not present

## 2020-11-17 DIAGNOSIS — R5381 Other malaise: Secondary | ICD-10-CM | POA: Diagnosis not present

## 2020-11-17 DIAGNOSIS — I10 Essential (primary) hypertension: Secondary | ICD-10-CM | POA: Diagnosis not present

## 2020-11-17 DIAGNOSIS — I120 Hypertensive chronic kidney disease with stage 5 chronic kidney disease or end stage renal disease: Secondary | ICD-10-CM | POA: Diagnosis not present

## 2020-11-17 DIAGNOSIS — N183 Chronic kidney disease, stage 3 unspecified: Secondary | ICD-10-CM | POA: Diagnosis not present

## 2020-11-17 DIAGNOSIS — T50995A Adverse effect of other drugs, medicaments and biological substances, initial encounter: Secondary | ICD-10-CM | POA: Diagnosis not present

## 2020-11-17 DIAGNOSIS — N186 End stage renal disease: Secondary | ICD-10-CM | POA: Diagnosis not present

## 2020-11-17 DIAGNOSIS — D649 Anemia, unspecified: Secondary | ICD-10-CM | POA: Diagnosis not present

## 2020-11-17 DIAGNOSIS — Z743 Need for continuous supervision: Secondary | ICD-10-CM | POA: Diagnosis not present

## 2020-11-17 DIAGNOSIS — I129 Hypertensive chronic kidney disease with stage 1 through stage 4 chronic kidney disease, or unspecified chronic kidney disease: Secondary | ICD-10-CM | POA: Diagnosis not present

## 2020-11-17 DIAGNOSIS — I4891 Unspecified atrial fibrillation: Secondary | ICD-10-CM | POA: Diagnosis not present

## 2020-11-17 DIAGNOSIS — G9341 Metabolic encephalopathy: Secondary | ICD-10-CM | POA: Diagnosis not present

## 2020-11-17 DIAGNOSIS — M6281 Muscle weakness (generalized): Secondary | ICD-10-CM | POA: Diagnosis not present

## 2020-11-17 DIAGNOSIS — E875 Hyperkalemia: Secondary | ICD-10-CM | POA: Diagnosis not present

## 2020-11-17 DIAGNOSIS — D84821 Immunodeficiency due to drugs: Secondary | ICD-10-CM | POA: Diagnosis not present

## 2020-11-17 DIAGNOSIS — U071 COVID-19: Secondary | ICD-10-CM | POA: Diagnosis not present

## 2020-11-17 DIAGNOSIS — N059 Unspecified nephritic syndrome with unspecified morphologic changes: Secondary | ICD-10-CM | POA: Diagnosis not present

## 2020-11-17 DIAGNOSIS — R2681 Unsteadiness on feet: Secondary | ICD-10-CM | POA: Diagnosis not present

## 2020-11-17 DIAGNOSIS — R7989 Other specified abnormal findings of blood chemistry: Secondary | ICD-10-CM | POA: Diagnosis not present

## 2020-11-17 DIAGNOSIS — Z8616 Personal history of COVID-19: Secondary | ICD-10-CM | POA: Diagnosis not present

## 2020-11-18 DIAGNOSIS — N059 Unspecified nephritic syndrome with unspecified morphologic changes: Secondary | ICD-10-CM | POA: Diagnosis not present

## 2020-11-18 DIAGNOSIS — I4891 Unspecified atrial fibrillation: Secondary | ICD-10-CM | POA: Diagnosis not present

## 2020-11-18 DIAGNOSIS — N186 End stage renal disease: Secondary | ICD-10-CM | POA: Diagnosis not present

## 2020-11-21 DIAGNOSIS — Z940 Kidney transplant status: Secondary | ICD-10-CM | POA: Diagnosis not present

## 2020-11-23 DIAGNOSIS — I4891 Unspecified atrial fibrillation: Secondary | ICD-10-CM | POA: Diagnosis not present

## 2020-11-23 DIAGNOSIS — N179 Acute kidney failure, unspecified: Secondary | ICD-10-CM | POA: Diagnosis not present

## 2020-11-23 DIAGNOSIS — G9341 Metabolic encephalopathy: Secondary | ICD-10-CM | POA: Diagnosis not present

## 2020-11-23 DIAGNOSIS — R531 Weakness: Secondary | ICD-10-CM | POA: Diagnosis not present

## 2020-11-23 DIAGNOSIS — N059 Unspecified nephritic syndrome with unspecified morphologic changes: Secondary | ICD-10-CM | POA: Diagnosis not present

## 2020-11-23 DIAGNOSIS — I10 Essential (primary) hypertension: Secondary | ICD-10-CM | POA: Diagnosis not present

## 2020-11-23 DIAGNOSIS — N186 End stage renal disease: Secondary | ICD-10-CM | POA: Diagnosis not present

## 2020-11-30 DIAGNOSIS — I4891 Unspecified atrial fibrillation: Secondary | ICD-10-CM | POA: Diagnosis not present

## 2020-11-30 DIAGNOSIS — I10 Essential (primary) hypertension: Secondary | ICD-10-CM | POA: Diagnosis not present

## 2020-11-30 DIAGNOSIS — N186 End stage renal disease: Secondary | ICD-10-CM | POA: Diagnosis not present

## 2020-11-30 DIAGNOSIS — N059 Unspecified nephritic syndrome with unspecified morphologic changes: Secondary | ICD-10-CM | POA: Diagnosis not present

## 2020-11-30 DIAGNOSIS — D649 Anemia, unspecified: Secondary | ICD-10-CM | POA: Diagnosis not present

## 2020-11-30 DIAGNOSIS — N179 Acute kidney failure, unspecified: Secondary | ICD-10-CM | POA: Diagnosis not present

## 2020-12-06 DIAGNOSIS — N289 Disorder of kidney and ureter, unspecified: Secondary | ICD-10-CM | POA: Diagnosis not present

## 2020-12-06 DIAGNOSIS — Z940 Kidney transplant status: Secondary | ICD-10-CM | POA: Diagnosis not present

## 2020-12-06 DIAGNOSIS — M109 Gout, unspecified: Secondary | ICD-10-CM | POA: Diagnosis not present

## 2020-12-06 DIAGNOSIS — M19011 Primary osteoarthritis, right shoulder: Secondary | ICD-10-CM | POA: Diagnosis not present

## 2020-12-06 DIAGNOSIS — M1712 Unilateral primary osteoarthritis, left knee: Secondary | ICD-10-CM | POA: Diagnosis not present

## 2020-12-06 DIAGNOSIS — G8929 Other chronic pain: Secondary | ICD-10-CM | POA: Diagnosis not present

## 2020-12-06 DIAGNOSIS — I129 Hypertensive chronic kidney disease with stage 1 through stage 4 chronic kidney disease, or unspecified chronic kidney disease: Secondary | ICD-10-CM | POA: Diagnosis not present

## 2020-12-06 DIAGNOSIS — M19012 Primary osteoarthritis, left shoulder: Secondary | ICD-10-CM | POA: Diagnosis not present

## 2020-12-06 DIAGNOSIS — M47816 Spondylosis without myelopathy or radiculopathy, lumbar region: Secondary | ICD-10-CM | POA: Diagnosis not present

## 2020-12-06 DIAGNOSIS — U071 COVID-19: Secondary | ICD-10-CM | POA: Diagnosis not present

## 2020-12-06 DIAGNOSIS — M4126 Other idiopathic scoliosis, lumbar region: Secondary | ICD-10-CM | POA: Diagnosis not present

## 2020-12-06 DIAGNOSIS — K219 Gastro-esophageal reflux disease without esophagitis: Secondary | ICD-10-CM | POA: Diagnosis not present

## 2020-12-06 DIAGNOSIS — E785 Hyperlipidemia, unspecified: Secondary | ICD-10-CM | POA: Diagnosis not present

## 2020-12-06 DIAGNOSIS — M1610 Unilateral primary osteoarthritis, unspecified hip: Secondary | ICD-10-CM | POA: Diagnosis not present

## 2020-12-06 DIAGNOSIS — Z7952 Long term (current) use of systemic steroids: Secondary | ICD-10-CM | POA: Diagnosis not present

## 2020-12-06 DIAGNOSIS — Z9181 History of falling: Secondary | ICD-10-CM | POA: Diagnosis not present

## 2020-12-06 DIAGNOSIS — M12811 Other specific arthropathies, not elsewhere classified, right shoulder: Secondary | ICD-10-CM | POA: Diagnosis not present

## 2020-12-06 DIAGNOSIS — I4891 Unspecified atrial fibrillation: Secondary | ICD-10-CM | POA: Diagnosis not present

## 2020-12-06 DIAGNOSIS — M5136 Other intervertebral disc degeneration, lumbar region: Secondary | ICD-10-CM | POA: Diagnosis not present

## 2020-12-06 DIAGNOSIS — I739 Peripheral vascular disease, unspecified: Secondary | ICD-10-CM | POA: Diagnosis not present

## 2020-12-06 DIAGNOSIS — M11262 Other chondrocalcinosis, left knee: Secondary | ICD-10-CM | POA: Diagnosis not present

## 2020-12-06 DIAGNOSIS — M48061 Spinal stenosis, lumbar region without neurogenic claudication: Secondary | ICD-10-CM | POA: Diagnosis not present

## 2020-12-06 DIAGNOSIS — M5416 Radiculopathy, lumbar region: Secondary | ICD-10-CM | POA: Diagnosis not present

## 2020-12-06 DIAGNOSIS — N189 Chronic kidney disease, unspecified: Secondary | ICD-10-CM | POA: Diagnosis not present

## 2020-12-09 DIAGNOSIS — M19012 Primary osteoarthritis, left shoulder: Secondary | ICD-10-CM | POA: Diagnosis not present

## 2020-12-09 DIAGNOSIS — M109 Gout, unspecified: Secondary | ICD-10-CM | POA: Diagnosis not present

## 2020-12-09 DIAGNOSIS — M5136 Other intervertebral disc degeneration, lumbar region: Secondary | ICD-10-CM | POA: Diagnosis not present

## 2020-12-09 DIAGNOSIS — N189 Chronic kidney disease, unspecified: Secondary | ICD-10-CM | POA: Diagnosis not present

## 2020-12-09 DIAGNOSIS — M1610 Unilateral primary osteoarthritis, unspecified hip: Secondary | ICD-10-CM | POA: Diagnosis not present

## 2020-12-09 DIAGNOSIS — M19011 Primary osteoarthritis, right shoulder: Secondary | ICD-10-CM | POA: Diagnosis not present

## 2020-12-09 DIAGNOSIS — I129 Hypertensive chronic kidney disease with stage 1 through stage 4 chronic kidney disease, or unspecified chronic kidney disease: Secondary | ICD-10-CM | POA: Diagnosis not present

## 2020-12-09 DIAGNOSIS — I739 Peripheral vascular disease, unspecified: Secondary | ICD-10-CM | POA: Diagnosis not present

## 2020-12-09 DIAGNOSIS — Z940 Kidney transplant status: Secondary | ICD-10-CM | POA: Diagnosis not present

## 2020-12-09 DIAGNOSIS — M5416 Radiculopathy, lumbar region: Secondary | ICD-10-CM | POA: Diagnosis not present

## 2020-12-09 DIAGNOSIS — M48061 Spinal stenosis, lumbar region without neurogenic claudication: Secondary | ICD-10-CM | POA: Diagnosis not present

## 2020-12-09 DIAGNOSIS — Z7952 Long term (current) use of systemic steroids: Secondary | ICD-10-CM | POA: Diagnosis not present

## 2020-12-09 DIAGNOSIS — M12811 Other specific arthropathies, not elsewhere classified, right shoulder: Secondary | ICD-10-CM | POA: Diagnosis not present

## 2020-12-09 DIAGNOSIS — Z9181 History of falling: Secondary | ICD-10-CM | POA: Diagnosis not present

## 2020-12-09 DIAGNOSIS — N289 Disorder of kidney and ureter, unspecified: Secondary | ICD-10-CM | POA: Diagnosis not present

## 2020-12-09 DIAGNOSIS — G8929 Other chronic pain: Secondary | ICD-10-CM | POA: Diagnosis not present

## 2020-12-09 DIAGNOSIS — M1712 Unilateral primary osteoarthritis, left knee: Secondary | ICD-10-CM | POA: Diagnosis not present

## 2020-12-09 DIAGNOSIS — M47816 Spondylosis without myelopathy or radiculopathy, lumbar region: Secondary | ICD-10-CM | POA: Diagnosis not present

## 2020-12-09 DIAGNOSIS — E785 Hyperlipidemia, unspecified: Secondary | ICD-10-CM | POA: Diagnosis not present

## 2020-12-09 DIAGNOSIS — M4126 Other idiopathic scoliosis, lumbar region: Secondary | ICD-10-CM | POA: Diagnosis not present

## 2020-12-09 DIAGNOSIS — K219 Gastro-esophageal reflux disease without esophagitis: Secondary | ICD-10-CM | POA: Diagnosis not present

## 2020-12-09 DIAGNOSIS — U071 COVID-19: Secondary | ICD-10-CM | POA: Diagnosis not present

## 2020-12-09 DIAGNOSIS — I4891 Unspecified atrial fibrillation: Secondary | ICD-10-CM | POA: Diagnosis not present

## 2020-12-09 DIAGNOSIS — M11262 Other chondrocalcinosis, left knee: Secondary | ICD-10-CM | POA: Diagnosis not present

## 2020-12-14 DIAGNOSIS — I4891 Unspecified atrial fibrillation: Secondary | ICD-10-CM | POA: Diagnosis not present

## 2020-12-14 DIAGNOSIS — M48061 Spinal stenosis, lumbar region without neurogenic claudication: Secondary | ICD-10-CM | POA: Diagnosis not present

## 2020-12-14 DIAGNOSIS — U071 COVID-19: Secondary | ICD-10-CM | POA: Diagnosis not present

## 2020-12-14 DIAGNOSIS — M5416 Radiculopathy, lumbar region: Secondary | ICD-10-CM | POA: Diagnosis not present

## 2020-12-14 DIAGNOSIS — M47816 Spondylosis without myelopathy or radiculopathy, lumbar region: Secondary | ICD-10-CM | POA: Diagnosis not present

## 2020-12-14 DIAGNOSIS — M1712 Unilateral primary osteoarthritis, left knee: Secondary | ICD-10-CM | POA: Diagnosis not present

## 2020-12-14 DIAGNOSIS — M5136 Other intervertebral disc degeneration, lumbar region: Secondary | ICD-10-CM | POA: Diagnosis not present

## 2020-12-14 DIAGNOSIS — I739 Peripheral vascular disease, unspecified: Secondary | ICD-10-CM | POA: Diagnosis not present

## 2020-12-14 DIAGNOSIS — N289 Disorder of kidney and ureter, unspecified: Secondary | ICD-10-CM | POA: Diagnosis not present

## 2020-12-14 DIAGNOSIS — G8929 Other chronic pain: Secondary | ICD-10-CM | POA: Diagnosis not present

## 2020-12-14 DIAGNOSIS — M19011 Primary osteoarthritis, right shoulder: Secondary | ICD-10-CM | POA: Diagnosis not present

## 2020-12-14 DIAGNOSIS — M12811 Other specific arthropathies, not elsewhere classified, right shoulder: Secondary | ICD-10-CM | POA: Diagnosis not present

## 2020-12-14 DIAGNOSIS — M19012 Primary osteoarthritis, left shoulder: Secondary | ICD-10-CM | POA: Diagnosis not present

## 2020-12-14 DIAGNOSIS — M1610 Unilateral primary osteoarthritis, unspecified hip: Secondary | ICD-10-CM | POA: Diagnosis not present

## 2020-12-14 DIAGNOSIS — Z9181 History of falling: Secondary | ICD-10-CM | POA: Diagnosis not present

## 2020-12-14 DIAGNOSIS — Z940 Kidney transplant status: Secondary | ICD-10-CM | POA: Diagnosis not present

## 2020-12-14 DIAGNOSIS — M109 Gout, unspecified: Secondary | ICD-10-CM | POA: Diagnosis not present

## 2020-12-14 DIAGNOSIS — N189 Chronic kidney disease, unspecified: Secondary | ICD-10-CM | POA: Diagnosis not present

## 2020-12-14 DIAGNOSIS — Z7952 Long term (current) use of systemic steroids: Secondary | ICD-10-CM | POA: Diagnosis not present

## 2020-12-14 DIAGNOSIS — M4126 Other idiopathic scoliosis, lumbar region: Secondary | ICD-10-CM | POA: Diagnosis not present

## 2020-12-14 DIAGNOSIS — I129 Hypertensive chronic kidney disease with stage 1 through stage 4 chronic kidney disease, or unspecified chronic kidney disease: Secondary | ICD-10-CM | POA: Diagnosis not present

## 2020-12-14 DIAGNOSIS — E785 Hyperlipidemia, unspecified: Secondary | ICD-10-CM | POA: Diagnosis not present

## 2020-12-14 DIAGNOSIS — K219 Gastro-esophageal reflux disease without esophagitis: Secondary | ICD-10-CM | POA: Diagnosis not present

## 2020-12-14 DIAGNOSIS — M11262 Other chondrocalcinosis, left knee: Secondary | ICD-10-CM | POA: Diagnosis not present

## 2020-12-15 DIAGNOSIS — M5136 Other intervertebral disc degeneration, lumbar region: Secondary | ICD-10-CM | POA: Diagnosis not present

## 2020-12-15 DIAGNOSIS — M11262 Other chondrocalcinosis, left knee: Secondary | ICD-10-CM | POA: Diagnosis not present

## 2020-12-15 DIAGNOSIS — E785 Hyperlipidemia, unspecified: Secondary | ICD-10-CM | POA: Diagnosis not present

## 2020-12-15 DIAGNOSIS — N289 Disorder of kidney and ureter, unspecified: Secondary | ICD-10-CM | POA: Diagnosis not present

## 2020-12-15 DIAGNOSIS — N189 Chronic kidney disease, unspecified: Secondary | ICD-10-CM | POA: Diagnosis not present

## 2020-12-15 DIAGNOSIS — M109 Gout, unspecified: Secondary | ICD-10-CM | POA: Diagnosis not present

## 2020-12-15 DIAGNOSIS — K219 Gastro-esophageal reflux disease without esophagitis: Secondary | ICD-10-CM | POA: Diagnosis not present

## 2020-12-15 DIAGNOSIS — G8929 Other chronic pain: Secondary | ICD-10-CM | POA: Diagnosis not present

## 2020-12-15 DIAGNOSIS — Z940 Kidney transplant status: Secondary | ICD-10-CM | POA: Diagnosis not present

## 2020-12-15 DIAGNOSIS — Z9181 History of falling: Secondary | ICD-10-CM | POA: Diagnosis not present

## 2020-12-15 DIAGNOSIS — M4126 Other idiopathic scoliosis, lumbar region: Secondary | ICD-10-CM | POA: Diagnosis not present

## 2020-12-15 DIAGNOSIS — I739 Peripheral vascular disease, unspecified: Secondary | ICD-10-CM | POA: Diagnosis not present

## 2020-12-15 DIAGNOSIS — U071 COVID-19: Secondary | ICD-10-CM | POA: Diagnosis not present

## 2020-12-15 DIAGNOSIS — Z7952 Long term (current) use of systemic steroids: Secondary | ICD-10-CM | POA: Diagnosis not present

## 2020-12-15 DIAGNOSIS — I4891 Unspecified atrial fibrillation: Secondary | ICD-10-CM | POA: Diagnosis not present

## 2020-12-15 DIAGNOSIS — M47816 Spondylosis without myelopathy or radiculopathy, lumbar region: Secondary | ICD-10-CM | POA: Diagnosis not present

## 2020-12-15 DIAGNOSIS — M19011 Primary osteoarthritis, right shoulder: Secondary | ICD-10-CM | POA: Diagnosis not present

## 2020-12-15 DIAGNOSIS — M1610 Unilateral primary osteoarthritis, unspecified hip: Secondary | ICD-10-CM | POA: Diagnosis not present

## 2020-12-15 DIAGNOSIS — M48061 Spinal stenosis, lumbar region without neurogenic claudication: Secondary | ICD-10-CM | POA: Diagnosis not present

## 2020-12-15 DIAGNOSIS — M1712 Unilateral primary osteoarthritis, left knee: Secondary | ICD-10-CM | POA: Diagnosis not present

## 2020-12-15 DIAGNOSIS — M19012 Primary osteoarthritis, left shoulder: Secondary | ICD-10-CM | POA: Diagnosis not present

## 2020-12-15 DIAGNOSIS — M12811 Other specific arthropathies, not elsewhere classified, right shoulder: Secondary | ICD-10-CM | POA: Diagnosis not present

## 2020-12-15 DIAGNOSIS — I129 Hypertensive chronic kidney disease with stage 1 through stage 4 chronic kidney disease, or unspecified chronic kidney disease: Secondary | ICD-10-CM | POA: Diagnosis not present

## 2020-12-15 DIAGNOSIS — M5416 Radiculopathy, lumbar region: Secondary | ICD-10-CM | POA: Diagnosis not present

## 2020-12-16 DIAGNOSIS — M19012 Primary osteoarthritis, left shoulder: Secondary | ICD-10-CM | POA: Diagnosis not present

## 2020-12-16 DIAGNOSIS — K219 Gastro-esophageal reflux disease without esophagitis: Secondary | ICD-10-CM | POA: Diagnosis not present

## 2020-12-16 DIAGNOSIS — I4891 Unspecified atrial fibrillation: Secondary | ICD-10-CM | POA: Diagnosis not present

## 2020-12-16 DIAGNOSIS — Z940 Kidney transplant status: Secondary | ICD-10-CM | POA: Diagnosis not present

## 2020-12-16 DIAGNOSIS — M1610 Unilateral primary osteoarthritis, unspecified hip: Secondary | ICD-10-CM | POA: Diagnosis not present

## 2020-12-16 DIAGNOSIS — M11262 Other chondrocalcinosis, left knee: Secondary | ICD-10-CM | POA: Diagnosis not present

## 2020-12-16 DIAGNOSIS — M5136 Other intervertebral disc degeneration, lumbar region: Secondary | ICD-10-CM | POA: Diagnosis not present

## 2020-12-16 DIAGNOSIS — I129 Hypertensive chronic kidney disease with stage 1 through stage 4 chronic kidney disease, or unspecified chronic kidney disease: Secondary | ICD-10-CM | POA: Diagnosis not present

## 2020-12-16 DIAGNOSIS — M109 Gout, unspecified: Secondary | ICD-10-CM | POA: Diagnosis not present

## 2020-12-16 DIAGNOSIS — M4126 Other idiopathic scoliosis, lumbar region: Secondary | ICD-10-CM | POA: Diagnosis not present

## 2020-12-16 DIAGNOSIS — M5416 Radiculopathy, lumbar region: Secondary | ICD-10-CM | POA: Diagnosis not present

## 2020-12-16 DIAGNOSIS — M1712 Unilateral primary osteoarthritis, left knee: Secondary | ICD-10-CM | POA: Diagnosis not present

## 2020-12-16 DIAGNOSIS — Z9181 History of falling: Secondary | ICD-10-CM | POA: Diagnosis not present

## 2020-12-16 DIAGNOSIS — U071 COVID-19: Secondary | ICD-10-CM | POA: Diagnosis not present

## 2020-12-16 DIAGNOSIS — M12811 Other specific arthropathies, not elsewhere classified, right shoulder: Secondary | ICD-10-CM | POA: Diagnosis not present

## 2020-12-16 DIAGNOSIS — N189 Chronic kidney disease, unspecified: Secondary | ICD-10-CM | POA: Diagnosis not present

## 2020-12-16 DIAGNOSIS — E785 Hyperlipidemia, unspecified: Secondary | ICD-10-CM | POA: Diagnosis not present

## 2020-12-16 DIAGNOSIS — M19011 Primary osteoarthritis, right shoulder: Secondary | ICD-10-CM | POA: Diagnosis not present

## 2020-12-16 DIAGNOSIS — Z7952 Long term (current) use of systemic steroids: Secondary | ICD-10-CM | POA: Diagnosis not present

## 2020-12-16 DIAGNOSIS — M48061 Spinal stenosis, lumbar region without neurogenic claudication: Secondary | ICD-10-CM | POA: Diagnosis not present

## 2020-12-16 DIAGNOSIS — N289 Disorder of kidney and ureter, unspecified: Secondary | ICD-10-CM | POA: Diagnosis not present

## 2020-12-16 DIAGNOSIS — M47816 Spondylosis without myelopathy or radiculopathy, lumbar region: Secondary | ICD-10-CM | POA: Diagnosis not present

## 2020-12-16 DIAGNOSIS — G8929 Other chronic pain: Secondary | ICD-10-CM | POA: Diagnosis not present

## 2020-12-16 DIAGNOSIS — I739 Peripheral vascular disease, unspecified: Secondary | ICD-10-CM | POA: Diagnosis not present

## 2020-12-18 DIAGNOSIS — I4891 Unspecified atrial fibrillation: Secondary | ICD-10-CM | POA: Diagnosis not present

## 2020-12-18 DIAGNOSIS — M48061 Spinal stenosis, lumbar region without neurogenic claudication: Secondary | ICD-10-CM | POA: Diagnosis not present

## 2020-12-18 DIAGNOSIS — M4126 Other idiopathic scoliosis, lumbar region: Secondary | ICD-10-CM | POA: Diagnosis not present

## 2020-12-18 DIAGNOSIS — M12811 Other specific arthropathies, not elsewhere classified, right shoulder: Secondary | ICD-10-CM | POA: Diagnosis not present

## 2020-12-18 DIAGNOSIS — Z9181 History of falling: Secondary | ICD-10-CM | POA: Diagnosis not present

## 2020-12-18 DIAGNOSIS — E785 Hyperlipidemia, unspecified: Secondary | ICD-10-CM | POA: Diagnosis not present

## 2020-12-18 DIAGNOSIS — M19012 Primary osteoarthritis, left shoulder: Secondary | ICD-10-CM | POA: Diagnosis not present

## 2020-12-18 DIAGNOSIS — M11262 Other chondrocalcinosis, left knee: Secondary | ICD-10-CM | POA: Diagnosis not present

## 2020-12-18 DIAGNOSIS — I129 Hypertensive chronic kidney disease with stage 1 through stage 4 chronic kidney disease, or unspecified chronic kidney disease: Secondary | ICD-10-CM | POA: Diagnosis not present

## 2020-12-18 DIAGNOSIS — K219 Gastro-esophageal reflux disease without esophagitis: Secondary | ICD-10-CM | POA: Diagnosis not present

## 2020-12-18 DIAGNOSIS — M47816 Spondylosis without myelopathy or radiculopathy, lumbar region: Secondary | ICD-10-CM | POA: Diagnosis not present

## 2020-12-18 DIAGNOSIS — G8929 Other chronic pain: Secondary | ICD-10-CM | POA: Diagnosis not present

## 2020-12-18 DIAGNOSIS — Z940 Kidney transplant status: Secondary | ICD-10-CM | POA: Diagnosis not present

## 2020-12-18 DIAGNOSIS — I739 Peripheral vascular disease, unspecified: Secondary | ICD-10-CM | POA: Diagnosis not present

## 2020-12-18 DIAGNOSIS — M19011 Primary osteoarthritis, right shoulder: Secondary | ICD-10-CM | POA: Diagnosis not present

## 2020-12-18 DIAGNOSIS — M5136 Other intervertebral disc degeneration, lumbar region: Secondary | ICD-10-CM | POA: Diagnosis not present

## 2020-12-18 DIAGNOSIS — M1712 Unilateral primary osteoarthritis, left knee: Secondary | ICD-10-CM | POA: Diagnosis not present

## 2020-12-18 DIAGNOSIS — N289 Disorder of kidney and ureter, unspecified: Secondary | ICD-10-CM | POA: Diagnosis not present

## 2020-12-18 DIAGNOSIS — N189 Chronic kidney disease, unspecified: Secondary | ICD-10-CM | POA: Diagnosis not present

## 2020-12-18 DIAGNOSIS — Z7952 Long term (current) use of systemic steroids: Secondary | ICD-10-CM | POA: Diagnosis not present

## 2020-12-18 DIAGNOSIS — M5416 Radiculopathy, lumbar region: Secondary | ICD-10-CM | POA: Diagnosis not present

## 2020-12-18 DIAGNOSIS — U071 COVID-19: Secondary | ICD-10-CM | POA: Diagnosis not present

## 2020-12-18 DIAGNOSIS — M109 Gout, unspecified: Secondary | ICD-10-CM | POA: Diagnosis not present

## 2020-12-18 DIAGNOSIS — M1610 Unilateral primary osteoarthritis, unspecified hip: Secondary | ICD-10-CM | POA: Diagnosis not present

## 2020-12-21 DIAGNOSIS — M5136 Other intervertebral disc degeneration, lumbar region: Secondary | ICD-10-CM | POA: Diagnosis not present

## 2020-12-21 DIAGNOSIS — K219 Gastro-esophageal reflux disease without esophagitis: Secondary | ICD-10-CM | POA: Diagnosis not present

## 2020-12-21 DIAGNOSIS — M12811 Other specific arthropathies, not elsewhere classified, right shoulder: Secondary | ICD-10-CM | POA: Diagnosis not present

## 2020-12-21 DIAGNOSIS — G8929 Other chronic pain: Secondary | ICD-10-CM | POA: Diagnosis not present

## 2020-12-21 DIAGNOSIS — M19012 Primary osteoarthritis, left shoulder: Secondary | ICD-10-CM | POA: Diagnosis not present

## 2020-12-21 DIAGNOSIS — N289 Disorder of kidney and ureter, unspecified: Secondary | ICD-10-CM | POA: Diagnosis not present

## 2020-12-21 DIAGNOSIS — I4891 Unspecified atrial fibrillation: Secondary | ICD-10-CM | POA: Diagnosis not present

## 2020-12-21 DIAGNOSIS — M48061 Spinal stenosis, lumbar region without neurogenic claudication: Secondary | ICD-10-CM | POA: Diagnosis not present

## 2020-12-21 DIAGNOSIS — M4126 Other idiopathic scoliosis, lumbar region: Secondary | ICD-10-CM | POA: Diagnosis not present

## 2020-12-21 DIAGNOSIS — M109 Gout, unspecified: Secondary | ICD-10-CM | POA: Diagnosis not present

## 2020-12-21 DIAGNOSIS — M1712 Unilateral primary osteoarthritis, left knee: Secondary | ICD-10-CM | POA: Diagnosis not present

## 2020-12-21 DIAGNOSIS — U071 COVID-19: Secondary | ICD-10-CM | POA: Diagnosis not present

## 2020-12-21 DIAGNOSIS — N189 Chronic kidney disease, unspecified: Secondary | ICD-10-CM | POA: Diagnosis not present

## 2020-12-21 DIAGNOSIS — M1610 Unilateral primary osteoarthritis, unspecified hip: Secondary | ICD-10-CM | POA: Diagnosis not present

## 2020-12-21 DIAGNOSIS — M5416 Radiculopathy, lumbar region: Secondary | ICD-10-CM | POA: Diagnosis not present

## 2020-12-21 DIAGNOSIS — M19011 Primary osteoarthritis, right shoulder: Secondary | ICD-10-CM | POA: Diagnosis not present

## 2020-12-21 DIAGNOSIS — I129 Hypertensive chronic kidney disease with stage 1 through stage 4 chronic kidney disease, or unspecified chronic kidney disease: Secondary | ICD-10-CM | POA: Diagnosis not present

## 2020-12-21 DIAGNOSIS — I739 Peripheral vascular disease, unspecified: Secondary | ICD-10-CM | POA: Diagnosis not present

## 2020-12-21 DIAGNOSIS — Z7952 Long term (current) use of systemic steroids: Secondary | ICD-10-CM | POA: Diagnosis not present

## 2020-12-21 DIAGNOSIS — Z9181 History of falling: Secondary | ICD-10-CM | POA: Diagnosis not present

## 2020-12-21 DIAGNOSIS — E785 Hyperlipidemia, unspecified: Secondary | ICD-10-CM | POA: Diagnosis not present

## 2020-12-21 DIAGNOSIS — M47816 Spondylosis without myelopathy or radiculopathy, lumbar region: Secondary | ICD-10-CM | POA: Diagnosis not present

## 2020-12-21 DIAGNOSIS — M11262 Other chondrocalcinosis, left knee: Secondary | ICD-10-CM | POA: Diagnosis not present

## 2020-12-21 DIAGNOSIS — Z940 Kidney transplant status: Secondary | ICD-10-CM | POA: Diagnosis not present

## 2020-12-22 DIAGNOSIS — U071 COVID-19: Secondary | ICD-10-CM | POA: Diagnosis not present

## 2020-12-22 DIAGNOSIS — I739 Peripheral vascular disease, unspecified: Secondary | ICD-10-CM | POA: Diagnosis not present

## 2020-12-22 DIAGNOSIS — Z940 Kidney transplant status: Secondary | ICD-10-CM | POA: Diagnosis not present

## 2020-12-22 DIAGNOSIS — M47816 Spondylosis without myelopathy or radiculopathy, lumbar region: Secondary | ICD-10-CM | POA: Diagnosis not present

## 2020-12-22 DIAGNOSIS — N189 Chronic kidney disease, unspecified: Secondary | ICD-10-CM | POA: Diagnosis not present

## 2020-12-22 DIAGNOSIS — M19011 Primary osteoarthritis, right shoulder: Secondary | ICD-10-CM | POA: Diagnosis not present

## 2020-12-22 DIAGNOSIS — M1610 Unilateral primary osteoarthritis, unspecified hip: Secondary | ICD-10-CM | POA: Diagnosis not present

## 2020-12-22 DIAGNOSIS — Z7952 Long term (current) use of systemic steroids: Secondary | ICD-10-CM | POA: Diagnosis not present

## 2020-12-22 DIAGNOSIS — M48061 Spinal stenosis, lumbar region without neurogenic claudication: Secondary | ICD-10-CM | POA: Diagnosis not present

## 2020-12-22 DIAGNOSIS — M109 Gout, unspecified: Secondary | ICD-10-CM | POA: Diagnosis not present

## 2020-12-22 DIAGNOSIS — G8929 Other chronic pain: Secondary | ICD-10-CM | POA: Diagnosis not present

## 2020-12-22 DIAGNOSIS — I4891 Unspecified atrial fibrillation: Secondary | ICD-10-CM | POA: Diagnosis not present

## 2020-12-22 DIAGNOSIS — M4126 Other idiopathic scoliosis, lumbar region: Secondary | ICD-10-CM | POA: Diagnosis not present

## 2020-12-22 DIAGNOSIS — M1712 Unilateral primary osteoarthritis, left knee: Secondary | ICD-10-CM | POA: Diagnosis not present

## 2020-12-22 DIAGNOSIS — M5416 Radiculopathy, lumbar region: Secondary | ICD-10-CM | POA: Diagnosis not present

## 2020-12-22 DIAGNOSIS — M11262 Other chondrocalcinosis, left knee: Secondary | ICD-10-CM | POA: Diagnosis not present

## 2020-12-22 DIAGNOSIS — I129 Hypertensive chronic kidney disease with stage 1 through stage 4 chronic kidney disease, or unspecified chronic kidney disease: Secondary | ICD-10-CM | POA: Diagnosis not present

## 2020-12-22 DIAGNOSIS — N289 Disorder of kidney and ureter, unspecified: Secondary | ICD-10-CM | POA: Diagnosis not present

## 2020-12-22 DIAGNOSIS — K219 Gastro-esophageal reflux disease without esophagitis: Secondary | ICD-10-CM | POA: Diagnosis not present

## 2020-12-22 DIAGNOSIS — M12811 Other specific arthropathies, not elsewhere classified, right shoulder: Secondary | ICD-10-CM | POA: Diagnosis not present

## 2020-12-22 DIAGNOSIS — M5136 Other intervertebral disc degeneration, lumbar region: Secondary | ICD-10-CM | POA: Diagnosis not present

## 2020-12-22 DIAGNOSIS — E785 Hyperlipidemia, unspecified: Secondary | ICD-10-CM | POA: Diagnosis not present

## 2020-12-22 DIAGNOSIS — M19012 Primary osteoarthritis, left shoulder: Secondary | ICD-10-CM | POA: Diagnosis not present

## 2020-12-22 DIAGNOSIS — Z9181 History of falling: Secondary | ICD-10-CM | POA: Diagnosis not present

## 2020-12-23 DIAGNOSIS — U071 COVID-19: Secondary | ICD-10-CM | POA: Diagnosis not present

## 2020-12-23 DIAGNOSIS — Z9181 History of falling: Secondary | ICD-10-CM | POA: Diagnosis not present

## 2020-12-23 DIAGNOSIS — I739 Peripheral vascular disease, unspecified: Secondary | ICD-10-CM | POA: Diagnosis not present

## 2020-12-23 DIAGNOSIS — M1712 Unilateral primary osteoarthritis, left knee: Secondary | ICD-10-CM | POA: Diagnosis not present

## 2020-12-23 DIAGNOSIS — M47816 Spondylosis without myelopathy or radiculopathy, lumbar region: Secondary | ICD-10-CM | POA: Diagnosis not present

## 2020-12-23 DIAGNOSIS — Z940 Kidney transplant status: Secondary | ICD-10-CM | POA: Diagnosis not present

## 2020-12-23 DIAGNOSIS — M12811 Other specific arthropathies, not elsewhere classified, right shoulder: Secondary | ICD-10-CM | POA: Diagnosis not present

## 2020-12-23 DIAGNOSIS — M1610 Unilateral primary osteoarthritis, unspecified hip: Secondary | ICD-10-CM | POA: Diagnosis not present

## 2020-12-23 DIAGNOSIS — M109 Gout, unspecified: Secondary | ICD-10-CM | POA: Diagnosis not present

## 2020-12-23 DIAGNOSIS — N289 Disorder of kidney and ureter, unspecified: Secondary | ICD-10-CM | POA: Diagnosis not present

## 2020-12-23 DIAGNOSIS — E785 Hyperlipidemia, unspecified: Secondary | ICD-10-CM | POA: Diagnosis not present

## 2020-12-23 DIAGNOSIS — M19011 Primary osteoarthritis, right shoulder: Secondary | ICD-10-CM | POA: Diagnosis not present

## 2020-12-23 DIAGNOSIS — I4891 Unspecified atrial fibrillation: Secondary | ICD-10-CM | POA: Diagnosis not present

## 2020-12-23 DIAGNOSIS — M11262 Other chondrocalcinosis, left knee: Secondary | ICD-10-CM | POA: Diagnosis not present

## 2020-12-23 DIAGNOSIS — M48061 Spinal stenosis, lumbar region without neurogenic claudication: Secondary | ICD-10-CM | POA: Diagnosis not present

## 2020-12-23 DIAGNOSIS — M19012 Primary osteoarthritis, left shoulder: Secondary | ICD-10-CM | POA: Diagnosis not present

## 2020-12-23 DIAGNOSIS — G8929 Other chronic pain: Secondary | ICD-10-CM | POA: Diagnosis not present

## 2020-12-23 DIAGNOSIS — Z7952 Long term (current) use of systemic steroids: Secondary | ICD-10-CM | POA: Diagnosis not present

## 2020-12-23 DIAGNOSIS — M5416 Radiculopathy, lumbar region: Secondary | ICD-10-CM | POA: Diagnosis not present

## 2020-12-23 DIAGNOSIS — N189 Chronic kidney disease, unspecified: Secondary | ICD-10-CM | POA: Diagnosis not present

## 2020-12-23 DIAGNOSIS — K219 Gastro-esophageal reflux disease without esophagitis: Secondary | ICD-10-CM | POA: Diagnosis not present

## 2020-12-23 DIAGNOSIS — M4126 Other idiopathic scoliosis, lumbar region: Secondary | ICD-10-CM | POA: Diagnosis not present

## 2020-12-23 DIAGNOSIS — I129 Hypertensive chronic kidney disease with stage 1 through stage 4 chronic kidney disease, or unspecified chronic kidney disease: Secondary | ICD-10-CM | POA: Diagnosis not present

## 2020-12-23 DIAGNOSIS — M5136 Other intervertebral disc degeneration, lumbar region: Secondary | ICD-10-CM | POA: Diagnosis not present

## 2020-12-27 DIAGNOSIS — M5416 Radiculopathy, lumbar region: Secondary | ICD-10-CM | POA: Diagnosis not present

## 2020-12-27 DIAGNOSIS — M19011 Primary osteoarthritis, right shoulder: Secondary | ICD-10-CM | POA: Diagnosis not present

## 2020-12-27 DIAGNOSIS — M11262 Other chondrocalcinosis, left knee: Secondary | ICD-10-CM | POA: Diagnosis not present

## 2020-12-27 DIAGNOSIS — K219 Gastro-esophageal reflux disease without esophagitis: Secondary | ICD-10-CM | POA: Diagnosis not present

## 2020-12-27 DIAGNOSIS — M1712 Unilateral primary osteoarthritis, left knee: Secondary | ICD-10-CM | POA: Diagnosis not present

## 2020-12-27 DIAGNOSIS — I739 Peripheral vascular disease, unspecified: Secondary | ICD-10-CM | POA: Diagnosis not present

## 2020-12-27 DIAGNOSIS — M12811 Other specific arthropathies, not elsewhere classified, right shoulder: Secondary | ICD-10-CM | POA: Diagnosis not present

## 2020-12-27 DIAGNOSIS — G8929 Other chronic pain: Secondary | ICD-10-CM | POA: Diagnosis not present

## 2020-12-27 DIAGNOSIS — M19012 Primary osteoarthritis, left shoulder: Secondary | ICD-10-CM | POA: Diagnosis not present

## 2020-12-27 DIAGNOSIS — I129 Hypertensive chronic kidney disease with stage 1 through stage 4 chronic kidney disease, or unspecified chronic kidney disease: Secondary | ICD-10-CM | POA: Diagnosis not present

## 2020-12-27 DIAGNOSIS — M5136 Other intervertebral disc degeneration, lumbar region: Secondary | ICD-10-CM | POA: Diagnosis not present

## 2020-12-27 DIAGNOSIS — M48061 Spinal stenosis, lumbar region without neurogenic claudication: Secondary | ICD-10-CM | POA: Diagnosis not present

## 2020-12-27 DIAGNOSIS — E785 Hyperlipidemia, unspecified: Secondary | ICD-10-CM | POA: Diagnosis not present

## 2020-12-27 DIAGNOSIS — N189 Chronic kidney disease, unspecified: Secondary | ICD-10-CM | POA: Diagnosis not present

## 2020-12-27 DIAGNOSIS — M4126 Other idiopathic scoliosis, lumbar region: Secondary | ICD-10-CM | POA: Diagnosis not present

## 2020-12-27 DIAGNOSIS — M47816 Spondylosis without myelopathy or radiculopathy, lumbar region: Secondary | ICD-10-CM | POA: Diagnosis not present

## 2020-12-27 DIAGNOSIS — Z7952 Long term (current) use of systemic steroids: Secondary | ICD-10-CM | POA: Diagnosis not present

## 2020-12-27 DIAGNOSIS — N289 Disorder of kidney and ureter, unspecified: Secondary | ICD-10-CM | POA: Diagnosis not present

## 2020-12-27 DIAGNOSIS — M1610 Unilateral primary osteoarthritis, unspecified hip: Secondary | ICD-10-CM | POA: Diagnosis not present

## 2020-12-27 DIAGNOSIS — D61818 Other pancytopenia: Secondary | ICD-10-CM | POA: Diagnosis not present

## 2020-12-27 DIAGNOSIS — M109 Gout, unspecified: Secondary | ICD-10-CM | POA: Diagnosis not present

## 2020-12-27 DIAGNOSIS — I4891 Unspecified atrial fibrillation: Secondary | ICD-10-CM | POA: Diagnosis not present

## 2020-12-27 DIAGNOSIS — E43 Unspecified severe protein-calorie malnutrition: Secondary | ICD-10-CM | POA: Diagnosis not present

## 2020-12-28 DIAGNOSIS — R7989 Other specified abnormal findings of blood chemistry: Secondary | ICD-10-CM | POA: Diagnosis not present

## 2020-12-28 DIAGNOSIS — Z940 Kidney transplant status: Secondary | ICD-10-CM | POA: Diagnosis not present

## 2020-12-28 DIAGNOSIS — N390 Urinary tract infection, site not specified: Secondary | ICD-10-CM | POA: Diagnosis not present

## 2020-12-28 DIAGNOSIS — R809 Proteinuria, unspecified: Secondary | ICD-10-CM | POA: Diagnosis not present

## 2020-12-28 DIAGNOSIS — E213 Hyperparathyroidism, unspecified: Secondary | ICD-10-CM | POA: Diagnosis not present

## 2020-12-29 DIAGNOSIS — M5416 Radiculopathy, lumbar region: Secondary | ICD-10-CM | POA: Diagnosis not present

## 2020-12-29 DIAGNOSIS — E785 Hyperlipidemia, unspecified: Secondary | ICD-10-CM | POA: Diagnosis not present

## 2020-12-29 DIAGNOSIS — M4126 Other idiopathic scoliosis, lumbar region: Secondary | ICD-10-CM | POA: Diagnosis not present

## 2020-12-29 DIAGNOSIS — M11262 Other chondrocalcinosis, left knee: Secondary | ICD-10-CM | POA: Diagnosis not present

## 2020-12-29 DIAGNOSIS — M5136 Other intervertebral disc degeneration, lumbar region: Secondary | ICD-10-CM | POA: Diagnosis not present

## 2020-12-29 DIAGNOSIS — G8929 Other chronic pain: Secondary | ICD-10-CM | POA: Diagnosis not present

## 2020-12-29 DIAGNOSIS — M109 Gout, unspecified: Secondary | ICD-10-CM | POA: Diagnosis not present

## 2020-12-29 DIAGNOSIS — I129 Hypertensive chronic kidney disease with stage 1 through stage 4 chronic kidney disease, or unspecified chronic kidney disease: Secondary | ICD-10-CM | POA: Diagnosis not present

## 2020-12-29 DIAGNOSIS — M1712 Unilateral primary osteoarthritis, left knee: Secondary | ICD-10-CM | POA: Diagnosis not present

## 2020-12-29 DIAGNOSIS — M1610 Unilateral primary osteoarthritis, unspecified hip: Secondary | ICD-10-CM | POA: Diagnosis not present

## 2020-12-29 DIAGNOSIS — N289 Disorder of kidney and ureter, unspecified: Secondary | ICD-10-CM | POA: Diagnosis not present

## 2020-12-29 DIAGNOSIS — I739 Peripheral vascular disease, unspecified: Secondary | ICD-10-CM | POA: Diagnosis not present

## 2020-12-29 DIAGNOSIS — M48061 Spinal stenosis, lumbar region without neurogenic claudication: Secondary | ICD-10-CM | POA: Diagnosis not present

## 2020-12-29 DIAGNOSIS — I4891 Unspecified atrial fibrillation: Secondary | ICD-10-CM | POA: Diagnosis not present

## 2020-12-29 DIAGNOSIS — K219 Gastro-esophageal reflux disease without esophagitis: Secondary | ICD-10-CM | POA: Diagnosis not present

## 2020-12-29 DIAGNOSIS — E43 Unspecified severe protein-calorie malnutrition: Secondary | ICD-10-CM | POA: Diagnosis not present

## 2020-12-29 DIAGNOSIS — M47816 Spondylosis without myelopathy or radiculopathy, lumbar region: Secondary | ICD-10-CM | POA: Diagnosis not present

## 2020-12-29 DIAGNOSIS — M12811 Other specific arthropathies, not elsewhere classified, right shoulder: Secondary | ICD-10-CM | POA: Diagnosis not present

## 2020-12-29 DIAGNOSIS — M19011 Primary osteoarthritis, right shoulder: Secondary | ICD-10-CM | POA: Diagnosis not present

## 2020-12-29 DIAGNOSIS — N189 Chronic kidney disease, unspecified: Secondary | ICD-10-CM | POA: Diagnosis not present

## 2020-12-29 DIAGNOSIS — D61818 Other pancytopenia: Secondary | ICD-10-CM | POA: Diagnosis not present

## 2020-12-29 DIAGNOSIS — M19012 Primary osteoarthritis, left shoulder: Secondary | ICD-10-CM | POA: Diagnosis not present

## 2020-12-29 DIAGNOSIS — Z7952 Long term (current) use of systemic steroids: Secondary | ICD-10-CM | POA: Diagnosis not present

## 2021-01-02 DIAGNOSIS — M5416 Radiculopathy, lumbar region: Secondary | ICD-10-CM | POA: Diagnosis not present

## 2021-01-02 DIAGNOSIS — M19011 Primary osteoarthritis, right shoulder: Secondary | ICD-10-CM | POA: Diagnosis not present

## 2021-01-02 DIAGNOSIS — I129 Hypertensive chronic kidney disease with stage 1 through stage 4 chronic kidney disease, or unspecified chronic kidney disease: Secondary | ICD-10-CM | POA: Diagnosis not present

## 2021-01-02 DIAGNOSIS — M19012 Primary osteoarthritis, left shoulder: Secondary | ICD-10-CM | POA: Diagnosis not present

## 2021-01-02 DIAGNOSIS — M4126 Other idiopathic scoliosis, lumbar region: Secondary | ICD-10-CM | POA: Diagnosis not present

## 2021-01-02 DIAGNOSIS — M47816 Spondylosis without myelopathy or radiculopathy, lumbar region: Secondary | ICD-10-CM | POA: Diagnosis not present

## 2021-01-02 DIAGNOSIS — G8929 Other chronic pain: Secondary | ICD-10-CM | POA: Diagnosis not present

## 2021-01-02 DIAGNOSIS — Z7952 Long term (current) use of systemic steroids: Secondary | ICD-10-CM | POA: Diagnosis not present

## 2021-01-02 DIAGNOSIS — E43 Unspecified severe protein-calorie malnutrition: Secondary | ICD-10-CM | POA: Diagnosis not present

## 2021-01-02 DIAGNOSIS — B338 Other specified viral diseases: Secondary | ICD-10-CM | POA: Diagnosis not present

## 2021-01-02 DIAGNOSIS — M48061 Spinal stenosis, lumbar region without neurogenic claudication: Secondary | ICD-10-CM | POA: Diagnosis not present

## 2021-01-02 DIAGNOSIS — M12811 Other specific arthropathies, not elsewhere classified, right shoulder: Secondary | ICD-10-CM | POA: Diagnosis not present

## 2021-01-02 DIAGNOSIS — M1712 Unilateral primary osteoarthritis, left knee: Secondary | ICD-10-CM | POA: Diagnosis not present

## 2021-01-02 DIAGNOSIS — K219 Gastro-esophageal reflux disease without esophagitis: Secondary | ICD-10-CM | POA: Diagnosis not present

## 2021-01-02 DIAGNOSIS — N189 Chronic kidney disease, unspecified: Secondary | ICD-10-CM | POA: Diagnosis not present

## 2021-01-02 DIAGNOSIS — M109 Gout, unspecified: Secondary | ICD-10-CM | POA: Diagnosis not present

## 2021-01-02 DIAGNOSIS — E785 Hyperlipidemia, unspecified: Secondary | ICD-10-CM | POA: Diagnosis not present

## 2021-01-02 DIAGNOSIS — D61818 Other pancytopenia: Secondary | ICD-10-CM | POA: Diagnosis not present

## 2021-01-02 DIAGNOSIS — I4891 Unspecified atrial fibrillation: Secondary | ICD-10-CM | POA: Diagnosis not present

## 2021-01-02 DIAGNOSIS — M11262 Other chondrocalcinosis, left knee: Secondary | ICD-10-CM | POA: Diagnosis not present

## 2021-01-02 DIAGNOSIS — N390 Urinary tract infection, site not specified: Secondary | ICD-10-CM | POA: Diagnosis not present

## 2021-01-02 DIAGNOSIS — I739 Peripheral vascular disease, unspecified: Secondary | ICD-10-CM | POA: Diagnosis not present

## 2021-01-02 DIAGNOSIS — Z940 Kidney transplant status: Secondary | ICD-10-CM | POA: Diagnosis not present

## 2021-01-02 DIAGNOSIS — N289 Disorder of kidney and ureter, unspecified: Secondary | ICD-10-CM | POA: Diagnosis not present

## 2021-01-02 DIAGNOSIS — M1610 Unilateral primary osteoarthritis, unspecified hip: Secondary | ICD-10-CM | POA: Diagnosis not present

## 2021-01-02 DIAGNOSIS — M5136 Other intervertebral disc degeneration, lumbar region: Secondary | ICD-10-CM | POA: Diagnosis not present

## 2021-01-05 DIAGNOSIS — M48061 Spinal stenosis, lumbar region without neurogenic claudication: Secondary | ICD-10-CM | POA: Diagnosis not present

## 2021-01-05 DIAGNOSIS — E785 Hyperlipidemia, unspecified: Secondary | ICD-10-CM | POA: Diagnosis not present

## 2021-01-05 DIAGNOSIS — M5416 Radiculopathy, lumbar region: Secondary | ICD-10-CM | POA: Diagnosis not present

## 2021-01-05 DIAGNOSIS — M1712 Unilateral primary osteoarthritis, left knee: Secondary | ICD-10-CM | POA: Diagnosis not present

## 2021-01-05 DIAGNOSIS — I129 Hypertensive chronic kidney disease with stage 1 through stage 4 chronic kidney disease, or unspecified chronic kidney disease: Secondary | ICD-10-CM | POA: Diagnosis not present

## 2021-01-05 DIAGNOSIS — D61818 Other pancytopenia: Secondary | ICD-10-CM | POA: Diagnosis not present

## 2021-01-05 DIAGNOSIS — M109 Gout, unspecified: Secondary | ICD-10-CM | POA: Diagnosis not present

## 2021-01-05 DIAGNOSIS — M1610 Unilateral primary osteoarthritis, unspecified hip: Secondary | ICD-10-CM | POA: Diagnosis not present

## 2021-01-05 DIAGNOSIS — I739 Peripheral vascular disease, unspecified: Secondary | ICD-10-CM | POA: Diagnosis not present

## 2021-01-05 DIAGNOSIS — M12811 Other specific arthropathies, not elsewhere classified, right shoulder: Secondary | ICD-10-CM | POA: Diagnosis not present

## 2021-01-05 DIAGNOSIS — M4126 Other idiopathic scoliosis, lumbar region: Secondary | ICD-10-CM | POA: Diagnosis not present

## 2021-01-05 DIAGNOSIS — Z7952 Long term (current) use of systemic steroids: Secondary | ICD-10-CM | POA: Diagnosis not present

## 2021-01-05 DIAGNOSIS — N289 Disorder of kidney and ureter, unspecified: Secondary | ICD-10-CM | POA: Diagnosis not present

## 2021-01-05 DIAGNOSIS — M19011 Primary osteoarthritis, right shoulder: Secondary | ICD-10-CM | POA: Diagnosis not present

## 2021-01-05 DIAGNOSIS — M19012 Primary osteoarthritis, left shoulder: Secondary | ICD-10-CM | POA: Diagnosis not present

## 2021-01-05 DIAGNOSIS — M11262 Other chondrocalcinosis, left knee: Secondary | ICD-10-CM | POA: Diagnosis not present

## 2021-01-05 DIAGNOSIS — K219 Gastro-esophageal reflux disease without esophagitis: Secondary | ICD-10-CM | POA: Diagnosis not present

## 2021-01-05 DIAGNOSIS — N189 Chronic kidney disease, unspecified: Secondary | ICD-10-CM | POA: Diagnosis not present

## 2021-01-05 DIAGNOSIS — E43 Unspecified severe protein-calorie malnutrition: Secondary | ICD-10-CM | POA: Diagnosis not present

## 2021-01-05 DIAGNOSIS — M5136 Other intervertebral disc degeneration, lumbar region: Secondary | ICD-10-CM | POA: Diagnosis not present

## 2021-01-05 DIAGNOSIS — G8929 Other chronic pain: Secondary | ICD-10-CM | POA: Diagnosis not present

## 2021-01-05 DIAGNOSIS — I4891 Unspecified atrial fibrillation: Secondary | ICD-10-CM | POA: Diagnosis not present

## 2021-01-05 DIAGNOSIS — M47816 Spondylosis without myelopathy or radiculopathy, lumbar region: Secondary | ICD-10-CM | POA: Diagnosis not present

## 2021-01-06 DIAGNOSIS — M5136 Other intervertebral disc degeneration, lumbar region: Secondary | ICD-10-CM | POA: Diagnosis not present

## 2021-01-06 DIAGNOSIS — Z7952 Long term (current) use of systemic steroids: Secondary | ICD-10-CM | POA: Diagnosis not present

## 2021-01-06 DIAGNOSIS — M5416 Radiculopathy, lumbar region: Secondary | ICD-10-CM | POA: Diagnosis not present

## 2021-01-06 DIAGNOSIS — M19012 Primary osteoarthritis, left shoulder: Secondary | ICD-10-CM | POA: Diagnosis not present

## 2021-01-06 DIAGNOSIS — M12811 Other specific arthropathies, not elsewhere classified, right shoulder: Secondary | ICD-10-CM | POA: Diagnosis not present

## 2021-01-06 DIAGNOSIS — M109 Gout, unspecified: Secondary | ICD-10-CM | POA: Diagnosis not present

## 2021-01-06 DIAGNOSIS — G8929 Other chronic pain: Secondary | ICD-10-CM | POA: Diagnosis not present

## 2021-01-06 DIAGNOSIS — M11262 Other chondrocalcinosis, left knee: Secondary | ICD-10-CM | POA: Diagnosis not present

## 2021-01-06 DIAGNOSIS — N289 Disorder of kidney and ureter, unspecified: Secondary | ICD-10-CM | POA: Diagnosis not present

## 2021-01-06 DIAGNOSIS — K219 Gastro-esophageal reflux disease without esophagitis: Secondary | ICD-10-CM | POA: Diagnosis not present

## 2021-01-06 DIAGNOSIS — I4891 Unspecified atrial fibrillation: Secondary | ICD-10-CM | POA: Diagnosis not present

## 2021-01-06 DIAGNOSIS — N189 Chronic kidney disease, unspecified: Secondary | ICD-10-CM | POA: Diagnosis not present

## 2021-01-06 DIAGNOSIS — D61818 Other pancytopenia: Secondary | ICD-10-CM | POA: Diagnosis not present

## 2021-01-06 DIAGNOSIS — M1610 Unilateral primary osteoarthritis, unspecified hip: Secondary | ICD-10-CM | POA: Diagnosis not present

## 2021-01-06 DIAGNOSIS — I129 Hypertensive chronic kidney disease with stage 1 through stage 4 chronic kidney disease, or unspecified chronic kidney disease: Secondary | ICD-10-CM | POA: Diagnosis not present

## 2021-01-06 DIAGNOSIS — I739 Peripheral vascular disease, unspecified: Secondary | ICD-10-CM | POA: Diagnosis not present

## 2021-01-06 DIAGNOSIS — M48061 Spinal stenosis, lumbar region without neurogenic claudication: Secondary | ICD-10-CM | POA: Diagnosis not present

## 2021-01-06 DIAGNOSIS — M1712 Unilateral primary osteoarthritis, left knee: Secondary | ICD-10-CM | POA: Diagnosis not present

## 2021-01-06 DIAGNOSIS — E785 Hyperlipidemia, unspecified: Secondary | ICD-10-CM | POA: Diagnosis not present

## 2021-01-06 DIAGNOSIS — E43 Unspecified severe protein-calorie malnutrition: Secondary | ICD-10-CM | POA: Diagnosis not present

## 2021-01-06 DIAGNOSIS — M47816 Spondylosis without myelopathy or radiculopathy, lumbar region: Secondary | ICD-10-CM | POA: Diagnosis not present

## 2021-01-06 DIAGNOSIS — M4126 Other idiopathic scoliosis, lumbar region: Secondary | ICD-10-CM | POA: Diagnosis not present

## 2021-01-06 DIAGNOSIS — M19011 Primary osteoarthritis, right shoulder: Secondary | ICD-10-CM | POA: Diagnosis not present

## 2021-01-09 DIAGNOSIS — Z940 Kidney transplant status: Secondary | ICD-10-CM | POA: Diagnosis not present

## 2021-01-09 DIAGNOSIS — B338 Other specified viral diseases: Secondary | ICD-10-CM | POA: Diagnosis not present

## 2021-01-10 DIAGNOSIS — M11262 Other chondrocalcinosis, left knee: Secondary | ICD-10-CM | POA: Diagnosis not present

## 2021-01-10 DIAGNOSIS — G8929 Other chronic pain: Secondary | ICD-10-CM | POA: Diagnosis not present

## 2021-01-10 DIAGNOSIS — E785 Hyperlipidemia, unspecified: Secondary | ICD-10-CM | POA: Diagnosis not present

## 2021-01-10 DIAGNOSIS — M19011 Primary osteoarthritis, right shoulder: Secondary | ICD-10-CM | POA: Diagnosis not present

## 2021-01-10 DIAGNOSIS — I129 Hypertensive chronic kidney disease with stage 1 through stage 4 chronic kidney disease, or unspecified chronic kidney disease: Secondary | ICD-10-CM | POA: Diagnosis not present

## 2021-01-10 DIAGNOSIS — D61818 Other pancytopenia: Secondary | ICD-10-CM | POA: Diagnosis not present

## 2021-01-10 DIAGNOSIS — E43 Unspecified severe protein-calorie malnutrition: Secondary | ICD-10-CM | POA: Diagnosis not present

## 2021-01-10 DIAGNOSIS — Z7952 Long term (current) use of systemic steroids: Secondary | ICD-10-CM | POA: Diagnosis not present

## 2021-01-10 DIAGNOSIS — M12811 Other specific arthropathies, not elsewhere classified, right shoulder: Secondary | ICD-10-CM | POA: Diagnosis not present

## 2021-01-10 DIAGNOSIS — I739 Peripheral vascular disease, unspecified: Secondary | ICD-10-CM | POA: Diagnosis not present

## 2021-01-10 DIAGNOSIS — M5136 Other intervertebral disc degeneration, lumbar region: Secondary | ICD-10-CM | POA: Diagnosis not present

## 2021-01-10 DIAGNOSIS — M109 Gout, unspecified: Secondary | ICD-10-CM | POA: Diagnosis not present

## 2021-01-10 DIAGNOSIS — M1712 Unilateral primary osteoarthritis, left knee: Secondary | ICD-10-CM | POA: Diagnosis not present

## 2021-01-10 DIAGNOSIS — I4891 Unspecified atrial fibrillation: Secondary | ICD-10-CM | POA: Diagnosis not present

## 2021-01-10 DIAGNOSIS — K219 Gastro-esophageal reflux disease without esophagitis: Secondary | ICD-10-CM | POA: Diagnosis not present

## 2021-01-10 DIAGNOSIS — N289 Disorder of kidney and ureter, unspecified: Secondary | ICD-10-CM | POA: Diagnosis not present

## 2021-01-10 DIAGNOSIS — M48061 Spinal stenosis, lumbar region without neurogenic claudication: Secondary | ICD-10-CM | POA: Diagnosis not present

## 2021-01-10 DIAGNOSIS — M1610 Unilateral primary osteoarthritis, unspecified hip: Secondary | ICD-10-CM | POA: Diagnosis not present

## 2021-01-10 DIAGNOSIS — M5416 Radiculopathy, lumbar region: Secondary | ICD-10-CM | POA: Diagnosis not present

## 2021-01-10 DIAGNOSIS — M4126 Other idiopathic scoliosis, lumbar region: Secondary | ICD-10-CM | POA: Diagnosis not present

## 2021-01-10 DIAGNOSIS — M19012 Primary osteoarthritis, left shoulder: Secondary | ICD-10-CM | POA: Diagnosis not present

## 2021-01-10 DIAGNOSIS — M47816 Spondylosis without myelopathy or radiculopathy, lumbar region: Secondary | ICD-10-CM | POA: Diagnosis not present

## 2021-01-10 DIAGNOSIS — N189 Chronic kidney disease, unspecified: Secondary | ICD-10-CM | POA: Diagnosis not present

## 2021-01-16 DIAGNOSIS — B338 Other specified viral diseases: Secondary | ICD-10-CM | POA: Diagnosis not present

## 2021-01-16 DIAGNOSIS — Z940 Kidney transplant status: Secondary | ICD-10-CM | POA: Diagnosis not present

## 2021-01-18 DIAGNOSIS — M1712 Unilateral primary osteoarthritis, left knee: Secondary | ICD-10-CM | POA: Diagnosis not present

## 2021-01-18 DIAGNOSIS — M48061 Spinal stenosis, lumbar region without neurogenic claudication: Secondary | ICD-10-CM | POA: Diagnosis not present

## 2021-01-18 DIAGNOSIS — Z940 Kidney transplant status: Secondary | ICD-10-CM | POA: Diagnosis not present

## 2021-01-18 DIAGNOSIS — N189 Chronic kidney disease, unspecified: Secondary | ICD-10-CM | POA: Diagnosis not present

## 2021-01-18 DIAGNOSIS — N390 Urinary tract infection, site not specified: Secondary | ICD-10-CM | POA: Diagnosis not present

## 2021-01-18 DIAGNOSIS — D61818 Other pancytopenia: Secondary | ICD-10-CM | POA: Diagnosis not present

## 2021-01-18 DIAGNOSIS — M19012 Primary osteoarthritis, left shoulder: Secondary | ICD-10-CM | POA: Diagnosis not present

## 2021-01-18 DIAGNOSIS — G8929 Other chronic pain: Secondary | ICD-10-CM | POA: Diagnosis not present

## 2021-01-18 DIAGNOSIS — M12811 Other specific arthropathies, not elsewhere classified, right shoulder: Secondary | ICD-10-CM | POA: Diagnosis not present

## 2021-01-18 DIAGNOSIS — I739 Peripheral vascular disease, unspecified: Secondary | ICD-10-CM | POA: Diagnosis not present

## 2021-01-18 DIAGNOSIS — M109 Gout, unspecified: Secondary | ICD-10-CM | POA: Diagnosis not present

## 2021-01-18 DIAGNOSIS — Z7952 Long term (current) use of systemic steroids: Secondary | ICD-10-CM | POA: Diagnosis not present

## 2021-01-18 DIAGNOSIS — I129 Hypertensive chronic kidney disease with stage 1 through stage 4 chronic kidney disease, or unspecified chronic kidney disease: Secondary | ICD-10-CM | POA: Diagnosis not present

## 2021-01-18 DIAGNOSIS — M5136 Other intervertebral disc degeneration, lumbar region: Secondary | ICD-10-CM | POA: Diagnosis not present

## 2021-01-18 DIAGNOSIS — M47816 Spondylosis without myelopathy or radiculopathy, lumbar region: Secondary | ICD-10-CM | POA: Diagnosis not present

## 2021-01-18 DIAGNOSIS — E785 Hyperlipidemia, unspecified: Secondary | ICD-10-CM | POA: Diagnosis not present

## 2021-01-18 DIAGNOSIS — N289 Disorder of kidney and ureter, unspecified: Secondary | ICD-10-CM | POA: Diagnosis not present

## 2021-01-18 DIAGNOSIS — M11262 Other chondrocalcinosis, left knee: Secondary | ICD-10-CM | POA: Diagnosis not present

## 2021-01-18 DIAGNOSIS — E43 Unspecified severe protein-calorie malnutrition: Secondary | ICD-10-CM | POA: Diagnosis not present

## 2021-01-18 DIAGNOSIS — M1610 Unilateral primary osteoarthritis, unspecified hip: Secondary | ICD-10-CM | POA: Diagnosis not present

## 2021-01-18 DIAGNOSIS — M5416 Radiculopathy, lumbar region: Secondary | ICD-10-CM | POA: Diagnosis not present

## 2021-01-18 DIAGNOSIS — K219 Gastro-esophageal reflux disease without esophagitis: Secondary | ICD-10-CM | POA: Diagnosis not present

## 2021-01-18 DIAGNOSIS — M4126 Other idiopathic scoliosis, lumbar region: Secondary | ICD-10-CM | POA: Diagnosis not present

## 2021-01-18 DIAGNOSIS — I4891 Unspecified atrial fibrillation: Secondary | ICD-10-CM | POA: Diagnosis not present

## 2021-01-18 DIAGNOSIS — M19011 Primary osteoarthritis, right shoulder: Secondary | ICD-10-CM | POA: Diagnosis not present

## 2021-01-23 DIAGNOSIS — B338 Other specified viral diseases: Secondary | ICD-10-CM | POA: Diagnosis not present

## 2021-01-23 DIAGNOSIS — Z940 Kidney transplant status: Secondary | ICD-10-CM | POA: Diagnosis not present

## 2021-02-08 DIAGNOSIS — U071 COVID-19: Secondary | ICD-10-CM | POA: Diagnosis not present

## 2021-02-08 DIAGNOSIS — Z940 Kidney transplant status: Secondary | ICD-10-CM | POA: Diagnosis not present

## 2021-02-08 DIAGNOSIS — Z7982 Long term (current) use of aspirin: Secondary | ICD-10-CM | POA: Diagnosis not present

## 2021-02-08 DIAGNOSIS — I10 Essential (primary) hypertension: Secondary | ICD-10-CM | POA: Diagnosis not present

## 2021-02-08 DIAGNOSIS — I4510 Unspecified right bundle-branch block: Secondary | ICD-10-CM | POA: Diagnosis not present

## 2021-02-08 DIAGNOSIS — I4891 Unspecified atrial fibrillation: Secondary | ICD-10-CM | POA: Diagnosis not present

## 2021-02-09 DIAGNOSIS — I4510 Unspecified right bundle-branch block: Secondary | ICD-10-CM | POA: Diagnosis not present

## 2021-02-09 DIAGNOSIS — I4891 Unspecified atrial fibrillation: Secondary | ICD-10-CM | POA: Diagnosis not present

## 2021-02-09 DIAGNOSIS — Z940 Kidney transplant status: Secondary | ICD-10-CM | POA: Diagnosis not present

## 2021-02-11 DIAGNOSIS — I517 Cardiomegaly: Secondary | ICD-10-CM | POA: Diagnosis not present

## 2021-03-31 IMAGING — CR DG HAND COMPLETE 3+V*R*
3 series · 3 of 3 positions shown · non-contrast
Comparison: None

CLINICAL DATA: Is fall, bruising to wrist and hand, generalized
pain

EXAM:
RIGHT WRIST - COMPLETE 3+ VIEW; RIGHT HAND - COMPLETE 3+ VIEW

[x hand pa right]
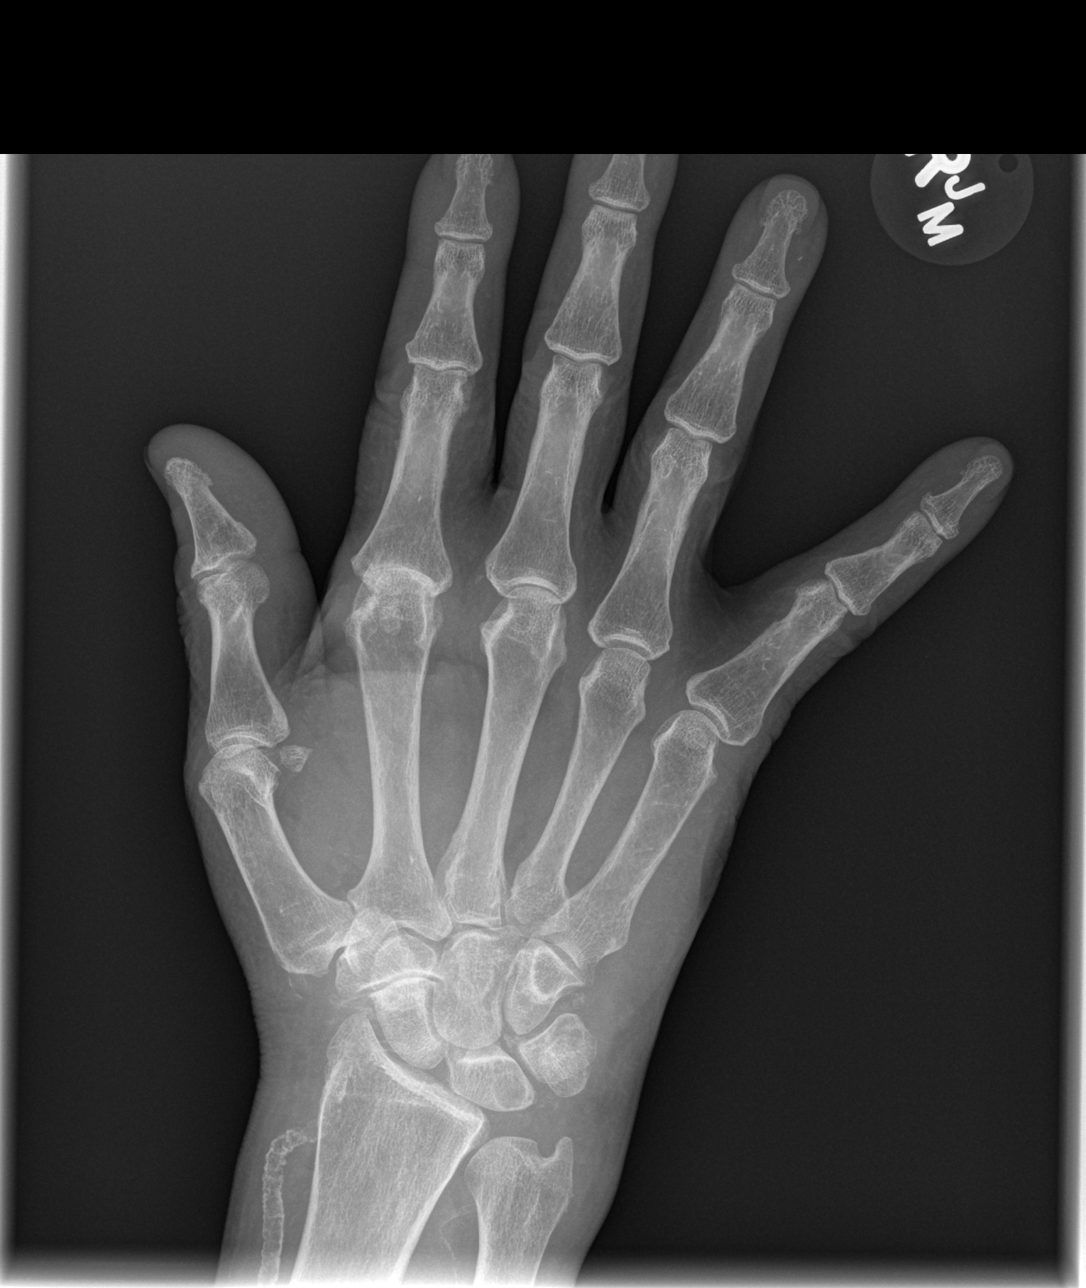

[x hand oblique right]
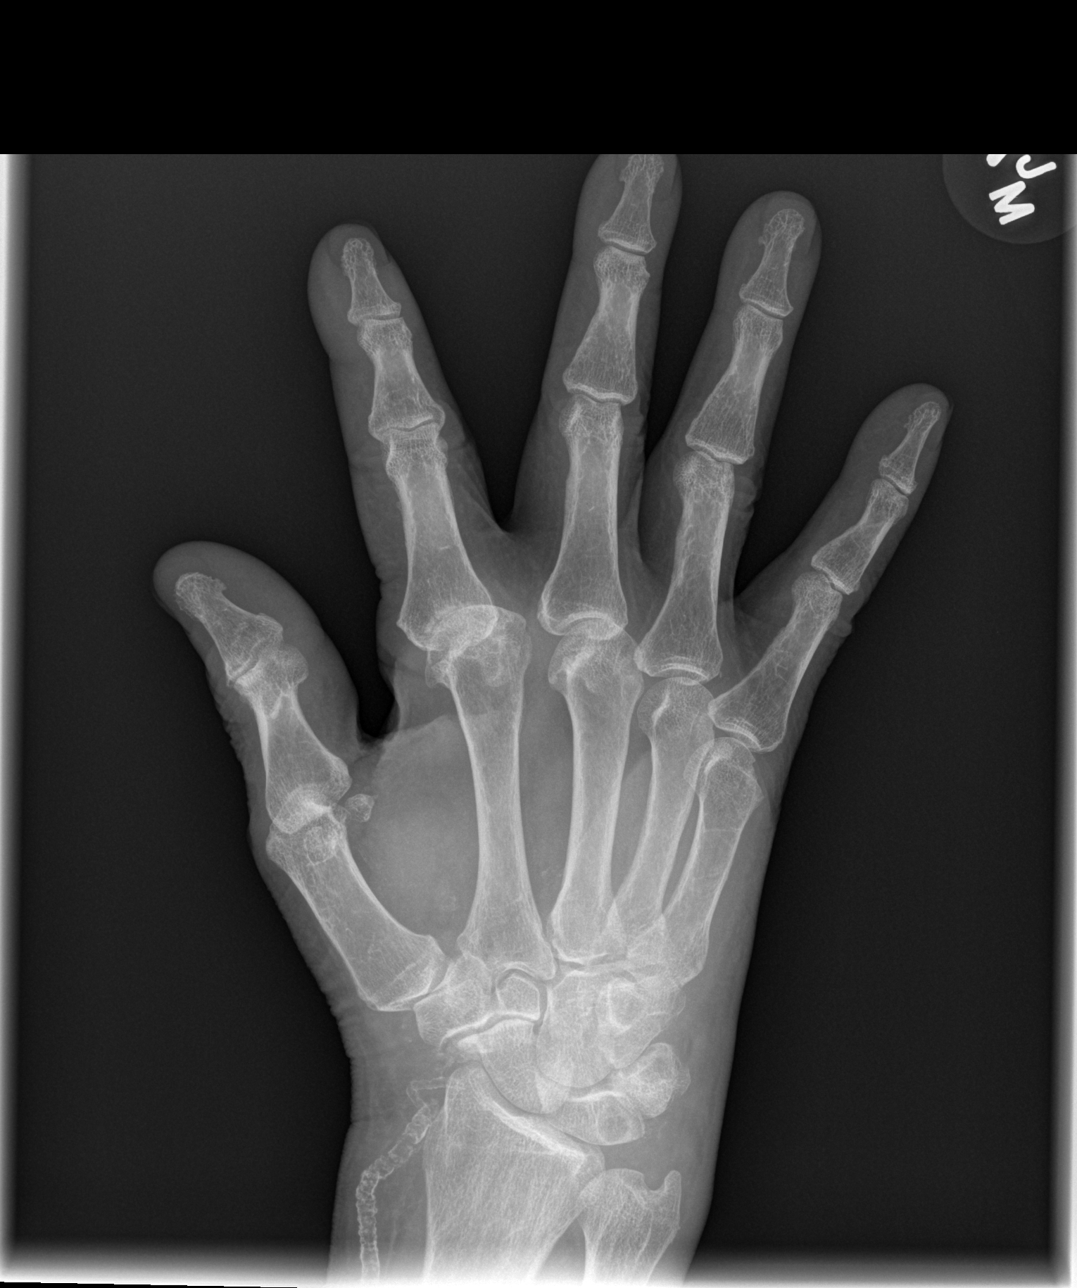

[x hand lat right]
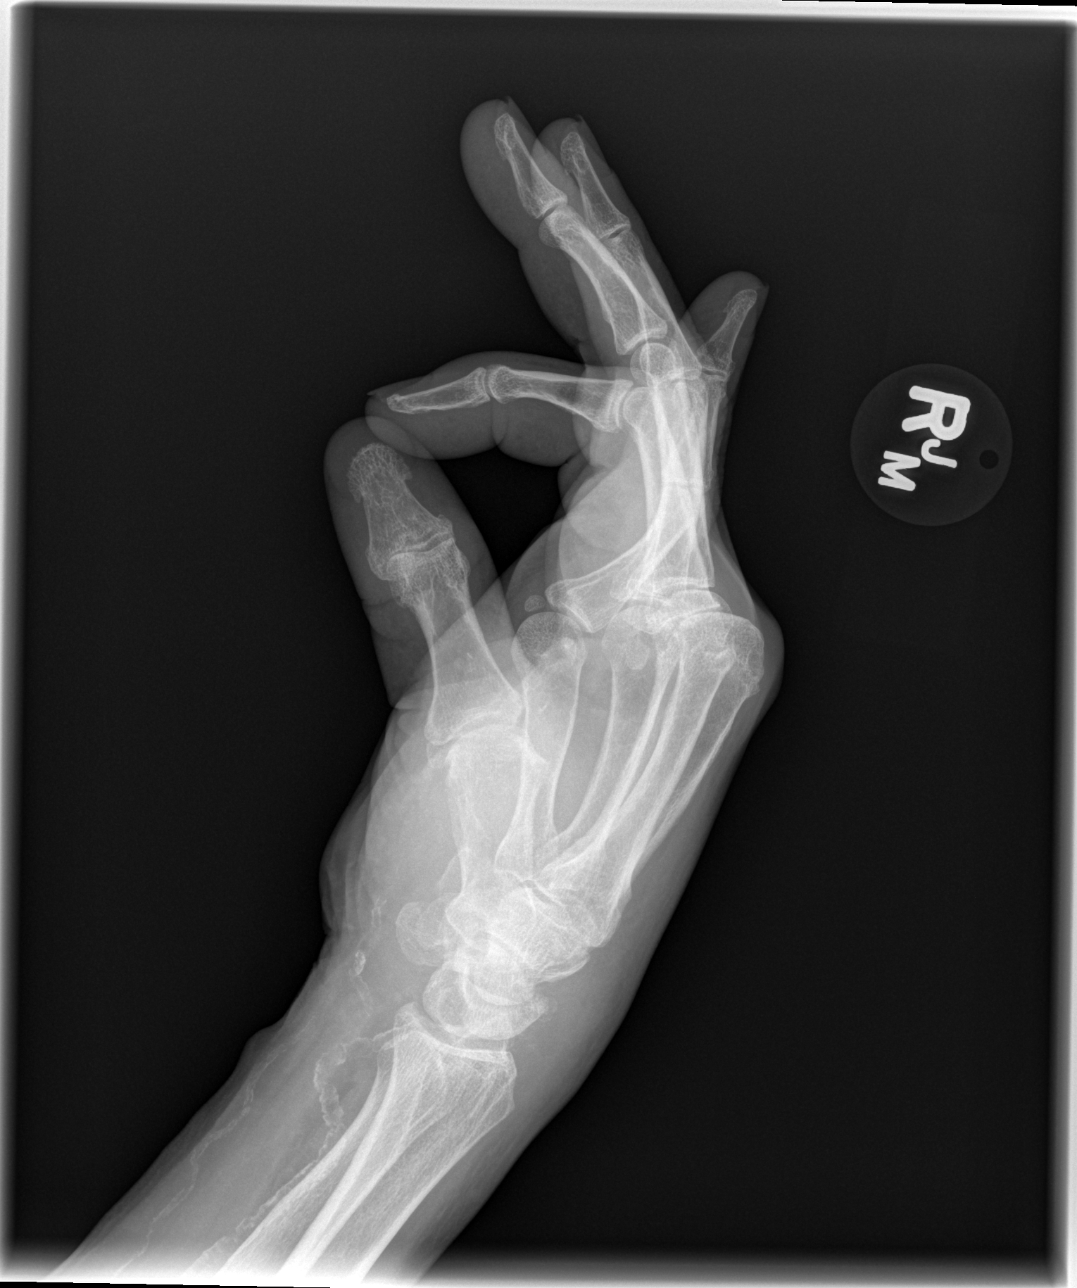

[3 of 3 positions shown; findings below may reference images not displayed]

FINDINGS: Dislocation of the second metacarpal phalangeal joint, index finger.

Degenerative changes about the hand and wrist. Added density on
lateral view overlying the volar wrist suggest avulsion injury in
this location.

Likely remote injury to the triquetral bone, on the lateral view as
well along the dorsal aspect of the wrist.

Osteopenia and signs of vascular disease.
IMPRESSION: 1. Dislocation of the second metacarpophalangeal joint, index
finger.
2. Question of avulsion injury along the volar surface of the wrist.
CT imaging may be helpful for further assessment.
3. Degenerative changes about the wrist.
4. Osteopenia and signs of vascular disease.

## 2021-04-20 DIAGNOSIS — Z940 Kidney transplant status: Secondary | ICD-10-CM | POA: Diagnosis not present

## 2021-05-10 DIAGNOSIS — E538 Deficiency of other specified B group vitamins: Secondary | ICD-10-CM | POA: Diagnosis not present

## 2021-05-10 DIAGNOSIS — R7303 Prediabetes: Secondary | ICD-10-CM | POA: Diagnosis not present

## 2021-05-10 DIAGNOSIS — K029 Dental caries, unspecified: Secondary | ICD-10-CM | POA: Diagnosis not present

## 2021-05-10 DIAGNOSIS — M1712 Unilateral primary osteoarthritis, left knee: Secondary | ICD-10-CM | POA: Diagnosis not present

## 2021-05-10 DIAGNOSIS — I119 Hypertensive heart disease without heart failure: Secondary | ICD-10-CM | POA: Diagnosis not present

## 2021-05-10 DIAGNOSIS — I10 Essential (primary) hypertension: Secondary | ICD-10-CM | POA: Diagnosis not present

## 2021-05-10 DIAGNOSIS — E7800 Pure hypercholesterolemia, unspecified: Secondary | ICD-10-CM | POA: Diagnosis not present

## 2021-05-10 DIAGNOSIS — R6884 Jaw pain: Secondary | ICD-10-CM | POA: Diagnosis not present

## 2021-05-10 DIAGNOSIS — E559 Vitamin D deficiency, unspecified: Secondary | ICD-10-CM | POA: Diagnosis not present

## 2021-05-30 DIAGNOSIS — E875 Hyperkalemia: Secondary | ICD-10-CM | POA: Diagnosis not present

## 2021-05-30 DIAGNOSIS — N390 Urinary tract infection, site not specified: Secondary | ICD-10-CM | POA: Diagnosis not present

## 2021-05-30 DIAGNOSIS — Z940 Kidney transplant status: Secondary | ICD-10-CM | POA: Diagnosis not present

## 2021-06-20 DIAGNOSIS — M1712 Unilateral primary osteoarthritis, left knee: Secondary | ICD-10-CM | POA: Diagnosis not present

## 2021-06-20 DIAGNOSIS — Z940 Kidney transplant status: Secondary | ICD-10-CM | POA: Diagnosis not present

## 2021-06-20 DIAGNOSIS — Z79899 Other long term (current) drug therapy: Secondary | ICD-10-CM | POA: Diagnosis not present

## 2021-06-20 DIAGNOSIS — N185 Chronic kidney disease, stage 5: Secondary | ICD-10-CM | POA: Diagnosis not present

## 2021-06-20 DIAGNOSIS — I739 Peripheral vascular disease, unspecified: Secondary | ICD-10-CM | POA: Diagnosis not present

## 2021-06-20 DIAGNOSIS — I890 Lymphedema, not elsewhere classified: Secondary | ICD-10-CM | POA: Diagnosis not present

## 2021-06-20 DIAGNOSIS — Z01818 Encounter for other preprocedural examination: Secondary | ICD-10-CM | POA: Diagnosis not present

## 2021-07-04 DIAGNOSIS — Z136 Encounter for screening for cardiovascular disorders: Secondary | ICD-10-CM | POA: Diagnosis not present

## 2021-07-04 DIAGNOSIS — E7800 Pure hypercholesterolemia, unspecified: Secondary | ICD-10-CM | POA: Diagnosis not present

## 2021-07-04 DIAGNOSIS — E538 Deficiency of other specified B group vitamins: Secondary | ICD-10-CM | POA: Diagnosis not present

## 2021-07-04 DIAGNOSIS — R7303 Prediabetes: Secondary | ICD-10-CM | POA: Diagnosis not present

## 2021-07-04 DIAGNOSIS — E559 Vitamin D deficiency, unspecified: Secondary | ICD-10-CM | POA: Diagnosis not present

## 2021-07-04 DIAGNOSIS — I10 Essential (primary) hypertension: Secondary | ICD-10-CM | POA: Diagnosis not present

## 2021-07-04 DIAGNOSIS — Z1329 Encounter for screening for other suspected endocrine disorder: Secondary | ICD-10-CM | POA: Diagnosis not present

## 2021-07-04 DIAGNOSIS — M1712 Unilateral primary osteoarthritis, left knee: Secondary | ICD-10-CM | POA: Diagnosis not present

## 2021-07-04 DIAGNOSIS — Z131 Encounter for screening for diabetes mellitus: Secondary | ICD-10-CM | POA: Diagnosis not present

## 2021-07-04 DIAGNOSIS — Z0001 Encounter for general adult medical examination with abnormal findings: Secondary | ICD-10-CM | POA: Diagnosis not present

## 2021-07-06 DIAGNOSIS — Z01812 Encounter for preprocedural laboratory examination: Secondary | ICD-10-CM | POA: Diagnosis not present

## 2021-07-06 DIAGNOSIS — M1712 Unilateral primary osteoarthritis, left knee: Secondary | ICD-10-CM | POA: Diagnosis not present

## 2021-07-17 DIAGNOSIS — G8918 Other acute postprocedural pain: Secondary | ICD-10-CM | POA: Diagnosis not present

## 2021-07-17 DIAGNOSIS — M48061 Spinal stenosis, lumbar region without neurogenic claudication: Secondary | ICD-10-CM | POA: Diagnosis not present

## 2021-07-17 DIAGNOSIS — E785 Hyperlipidemia, unspecified: Secondary | ICD-10-CM | POA: Diagnosis not present

## 2021-07-17 DIAGNOSIS — I739 Peripheral vascular disease, unspecified: Secondary | ICD-10-CM | POA: Diagnosis not present

## 2021-07-17 DIAGNOSIS — K219 Gastro-esophageal reflux disease without esophagitis: Secondary | ICD-10-CM | POA: Diagnosis not present

## 2021-07-17 DIAGNOSIS — M329 Systemic lupus erythematosus, unspecified: Secondary | ICD-10-CM | POA: Diagnosis not present

## 2021-07-17 DIAGNOSIS — Z940 Kidney transplant status: Secondary | ICD-10-CM | POA: Diagnosis not present

## 2021-07-17 DIAGNOSIS — M1712 Unilateral primary osteoarthritis, left knee: Secondary | ICD-10-CM | POA: Diagnosis not present

## 2021-07-17 DIAGNOSIS — Z9049 Acquired absence of other specified parts of digestive tract: Secondary | ICD-10-CM | POA: Diagnosis not present

## 2021-07-20 DIAGNOSIS — Z96652 Presence of left artificial knee joint: Secondary | ICD-10-CM | POA: Diagnosis not present

## 2021-07-20 DIAGNOSIS — E7800 Pure hypercholesterolemia, unspecified: Secondary | ICD-10-CM | POA: Diagnosis not present

## 2021-07-20 DIAGNOSIS — Z940 Kidney transplant status: Secondary | ICD-10-CM | POA: Diagnosis not present

## 2021-07-20 DIAGNOSIS — I739 Peripheral vascular disease, unspecified: Secondary | ICD-10-CM | POA: Diagnosis not present

## 2021-07-20 DIAGNOSIS — M5116 Intervertebral disc disorders with radiculopathy, lumbar region: Secondary | ICD-10-CM | POA: Diagnosis not present

## 2021-07-20 DIAGNOSIS — Z792 Long term (current) use of antibiotics: Secondary | ICD-10-CM | POA: Diagnosis not present

## 2021-07-20 DIAGNOSIS — I4510 Unspecified right bundle-branch block: Secondary | ICD-10-CM | POA: Diagnosis not present

## 2021-07-20 DIAGNOSIS — M169 Osteoarthritis of hip, unspecified: Secondary | ICD-10-CM | POA: Diagnosis not present

## 2021-07-20 DIAGNOSIS — N189 Chronic kidney disease, unspecified: Secondary | ICD-10-CM | POA: Diagnosis not present

## 2021-07-20 DIAGNOSIS — M19019 Primary osteoarthritis, unspecified shoulder: Secondary | ICD-10-CM | POA: Diagnosis not present

## 2021-07-20 DIAGNOSIS — Z9049 Acquired absence of other specified parts of digestive tract: Secondary | ICD-10-CM | POA: Diagnosis not present

## 2021-07-20 DIAGNOSIS — K5900 Constipation, unspecified: Secondary | ICD-10-CM | POA: Diagnosis not present

## 2021-07-20 DIAGNOSIS — M419 Scoliosis, unspecified: Secondary | ICD-10-CM | POA: Diagnosis not present

## 2021-07-20 DIAGNOSIS — I4891 Unspecified atrial fibrillation: Secondary | ICD-10-CM | POA: Diagnosis not present

## 2021-07-20 DIAGNOSIS — Z471 Aftercare following joint replacement surgery: Secondary | ICD-10-CM | POA: Diagnosis not present

## 2021-07-20 DIAGNOSIS — M109 Gout, unspecified: Secondary | ICD-10-CM | POA: Diagnosis not present

## 2021-07-20 DIAGNOSIS — K219 Gastro-esophageal reflux disease without esophagitis: Secondary | ICD-10-CM | POA: Diagnosis not present

## 2021-07-20 DIAGNOSIS — I129 Hypertensive chronic kidney disease with stage 1 through stage 4 chronic kidney disease, or unspecified chronic kidney disease: Secondary | ICD-10-CM | POA: Diagnosis not present

## 2021-07-24 DIAGNOSIS — M5116 Intervertebral disc disorders with radiculopathy, lumbar region: Secondary | ICD-10-CM | POA: Diagnosis not present

## 2021-07-24 DIAGNOSIS — M19019 Primary osteoarthritis, unspecified shoulder: Secondary | ICD-10-CM | POA: Diagnosis not present

## 2021-07-24 DIAGNOSIS — K219 Gastro-esophageal reflux disease without esophagitis: Secondary | ICD-10-CM | POA: Diagnosis not present

## 2021-07-24 DIAGNOSIS — Z792 Long term (current) use of antibiotics: Secondary | ICD-10-CM | POA: Diagnosis not present

## 2021-07-24 DIAGNOSIS — E7800 Pure hypercholesterolemia, unspecified: Secondary | ICD-10-CM | POA: Diagnosis not present

## 2021-07-24 DIAGNOSIS — K5900 Constipation, unspecified: Secondary | ICD-10-CM | POA: Diagnosis not present

## 2021-07-24 DIAGNOSIS — M419 Scoliosis, unspecified: Secondary | ICD-10-CM | POA: Diagnosis not present

## 2021-07-24 DIAGNOSIS — M109 Gout, unspecified: Secondary | ICD-10-CM | POA: Diagnosis not present

## 2021-07-24 DIAGNOSIS — Z471 Aftercare following joint replacement surgery: Secondary | ICD-10-CM | POA: Diagnosis not present

## 2021-07-24 DIAGNOSIS — Z96652 Presence of left artificial knee joint: Secondary | ICD-10-CM | POA: Diagnosis not present

## 2021-07-24 DIAGNOSIS — I4891 Unspecified atrial fibrillation: Secondary | ICD-10-CM | POA: Diagnosis not present

## 2021-07-24 DIAGNOSIS — I4510 Unspecified right bundle-branch block: Secondary | ICD-10-CM | POA: Diagnosis not present

## 2021-07-24 DIAGNOSIS — I739 Peripheral vascular disease, unspecified: Secondary | ICD-10-CM | POA: Diagnosis not present

## 2021-07-24 DIAGNOSIS — Z940 Kidney transplant status: Secondary | ICD-10-CM | POA: Diagnosis not present

## 2021-07-24 DIAGNOSIS — M169 Osteoarthritis of hip, unspecified: Secondary | ICD-10-CM | POA: Diagnosis not present

## 2021-07-24 DIAGNOSIS — N189 Chronic kidney disease, unspecified: Secondary | ICD-10-CM | POA: Diagnosis not present

## 2021-07-24 DIAGNOSIS — Z9049 Acquired absence of other specified parts of digestive tract: Secondary | ICD-10-CM | POA: Diagnosis not present

## 2021-07-24 DIAGNOSIS — I129 Hypertensive chronic kidney disease with stage 1 through stage 4 chronic kidney disease, or unspecified chronic kidney disease: Secondary | ICD-10-CM | POA: Diagnosis not present

## 2021-07-25 DIAGNOSIS — I739 Peripheral vascular disease, unspecified: Secondary | ICD-10-CM | POA: Diagnosis not present

## 2021-07-25 DIAGNOSIS — M5116 Intervertebral disc disorders with radiculopathy, lumbar region: Secondary | ICD-10-CM | POA: Diagnosis not present

## 2021-07-25 DIAGNOSIS — E7800 Pure hypercholesterolemia, unspecified: Secondary | ICD-10-CM | POA: Diagnosis not present

## 2021-07-25 DIAGNOSIS — K219 Gastro-esophageal reflux disease without esophagitis: Secondary | ICD-10-CM | POA: Diagnosis not present

## 2021-07-25 DIAGNOSIS — M109 Gout, unspecified: Secondary | ICD-10-CM | POA: Diagnosis not present

## 2021-07-25 DIAGNOSIS — M169 Osteoarthritis of hip, unspecified: Secondary | ICD-10-CM | POA: Diagnosis not present

## 2021-07-25 DIAGNOSIS — Z471 Aftercare following joint replacement surgery: Secondary | ICD-10-CM | POA: Diagnosis not present

## 2021-07-25 DIAGNOSIS — Z792 Long term (current) use of antibiotics: Secondary | ICD-10-CM | POA: Diagnosis not present

## 2021-07-25 DIAGNOSIS — M19019 Primary osteoarthritis, unspecified shoulder: Secondary | ICD-10-CM | POA: Diagnosis not present

## 2021-07-25 DIAGNOSIS — M419 Scoliosis, unspecified: Secondary | ICD-10-CM | POA: Diagnosis not present

## 2021-07-25 DIAGNOSIS — N189 Chronic kidney disease, unspecified: Secondary | ICD-10-CM | POA: Diagnosis not present

## 2021-07-25 DIAGNOSIS — Z940 Kidney transplant status: Secondary | ICD-10-CM | POA: Diagnosis not present

## 2021-07-25 DIAGNOSIS — K5900 Constipation, unspecified: Secondary | ICD-10-CM | POA: Diagnosis not present

## 2021-07-25 DIAGNOSIS — I4510 Unspecified right bundle-branch block: Secondary | ICD-10-CM | POA: Diagnosis not present

## 2021-07-25 DIAGNOSIS — I129 Hypertensive chronic kidney disease with stage 1 through stage 4 chronic kidney disease, or unspecified chronic kidney disease: Secondary | ICD-10-CM | POA: Diagnosis not present

## 2021-07-25 DIAGNOSIS — Z96652 Presence of left artificial knee joint: Secondary | ICD-10-CM | POA: Diagnosis not present

## 2021-07-25 DIAGNOSIS — I4891 Unspecified atrial fibrillation: Secondary | ICD-10-CM | POA: Diagnosis not present

## 2021-07-25 DIAGNOSIS — Z9049 Acquired absence of other specified parts of digestive tract: Secondary | ICD-10-CM | POA: Diagnosis not present

## 2021-07-26 DIAGNOSIS — I4891 Unspecified atrial fibrillation: Secondary | ICD-10-CM | POA: Diagnosis not present

## 2021-07-26 DIAGNOSIS — K219 Gastro-esophageal reflux disease without esophagitis: Secondary | ICD-10-CM | POA: Diagnosis not present

## 2021-07-26 DIAGNOSIS — Z96652 Presence of left artificial knee joint: Secondary | ICD-10-CM | POA: Diagnosis not present

## 2021-07-26 DIAGNOSIS — E7800 Pure hypercholesterolemia, unspecified: Secondary | ICD-10-CM | POA: Diagnosis not present

## 2021-07-26 DIAGNOSIS — M5116 Intervertebral disc disorders with radiculopathy, lumbar region: Secondary | ICD-10-CM | POA: Diagnosis not present

## 2021-07-26 DIAGNOSIS — Z940 Kidney transplant status: Secondary | ICD-10-CM | POA: Diagnosis not present

## 2021-07-26 DIAGNOSIS — Z792 Long term (current) use of antibiotics: Secondary | ICD-10-CM | POA: Diagnosis not present

## 2021-07-26 DIAGNOSIS — M109 Gout, unspecified: Secondary | ICD-10-CM | POA: Diagnosis not present

## 2021-07-26 DIAGNOSIS — I739 Peripheral vascular disease, unspecified: Secondary | ICD-10-CM | POA: Diagnosis not present

## 2021-07-26 DIAGNOSIS — Z471 Aftercare following joint replacement surgery: Secondary | ICD-10-CM | POA: Diagnosis not present

## 2021-07-26 DIAGNOSIS — M419 Scoliosis, unspecified: Secondary | ICD-10-CM | POA: Diagnosis not present

## 2021-07-26 DIAGNOSIS — Z9049 Acquired absence of other specified parts of digestive tract: Secondary | ICD-10-CM | POA: Diagnosis not present

## 2021-07-26 DIAGNOSIS — N189 Chronic kidney disease, unspecified: Secondary | ICD-10-CM | POA: Diagnosis not present

## 2021-07-26 DIAGNOSIS — K5900 Constipation, unspecified: Secondary | ICD-10-CM | POA: Diagnosis not present

## 2021-07-26 DIAGNOSIS — M19019 Primary osteoarthritis, unspecified shoulder: Secondary | ICD-10-CM | POA: Diagnosis not present

## 2021-07-26 DIAGNOSIS — I4510 Unspecified right bundle-branch block: Secondary | ICD-10-CM | POA: Diagnosis not present

## 2021-07-26 DIAGNOSIS — M169 Osteoarthritis of hip, unspecified: Secondary | ICD-10-CM | POA: Diagnosis not present

## 2021-07-26 DIAGNOSIS — I129 Hypertensive chronic kidney disease with stage 1 through stage 4 chronic kidney disease, or unspecified chronic kidney disease: Secondary | ICD-10-CM | POA: Diagnosis not present

## 2021-07-28 DIAGNOSIS — I4891 Unspecified atrial fibrillation: Secondary | ICD-10-CM | POA: Diagnosis not present

## 2021-07-28 DIAGNOSIS — I739 Peripheral vascular disease, unspecified: Secondary | ICD-10-CM | POA: Diagnosis not present

## 2021-07-28 DIAGNOSIS — Z9049 Acquired absence of other specified parts of digestive tract: Secondary | ICD-10-CM | POA: Diagnosis not present

## 2021-07-28 DIAGNOSIS — M109 Gout, unspecified: Secondary | ICD-10-CM | POA: Diagnosis not present

## 2021-07-28 DIAGNOSIS — N189 Chronic kidney disease, unspecified: Secondary | ICD-10-CM | POA: Diagnosis not present

## 2021-07-28 DIAGNOSIS — M419 Scoliosis, unspecified: Secondary | ICD-10-CM | POA: Diagnosis not present

## 2021-07-28 DIAGNOSIS — I4510 Unspecified right bundle-branch block: Secondary | ICD-10-CM | POA: Diagnosis not present

## 2021-07-28 DIAGNOSIS — Z471 Aftercare following joint replacement surgery: Secondary | ICD-10-CM | POA: Diagnosis not present

## 2021-07-28 DIAGNOSIS — Z940 Kidney transplant status: Secondary | ICD-10-CM | POA: Diagnosis not present

## 2021-07-28 DIAGNOSIS — M5116 Intervertebral disc disorders with radiculopathy, lumbar region: Secondary | ICD-10-CM | POA: Diagnosis not present

## 2021-07-28 DIAGNOSIS — I129 Hypertensive chronic kidney disease with stage 1 through stage 4 chronic kidney disease, or unspecified chronic kidney disease: Secondary | ICD-10-CM | POA: Diagnosis not present

## 2021-07-28 DIAGNOSIS — E7800 Pure hypercholesterolemia, unspecified: Secondary | ICD-10-CM | POA: Diagnosis not present

## 2021-07-28 DIAGNOSIS — K219 Gastro-esophageal reflux disease without esophagitis: Secondary | ICD-10-CM | POA: Diagnosis not present

## 2021-07-28 DIAGNOSIS — M169 Osteoarthritis of hip, unspecified: Secondary | ICD-10-CM | POA: Diagnosis not present

## 2021-07-28 DIAGNOSIS — M19019 Primary osteoarthritis, unspecified shoulder: Secondary | ICD-10-CM | POA: Diagnosis not present

## 2021-07-28 DIAGNOSIS — K5900 Constipation, unspecified: Secondary | ICD-10-CM | POA: Diagnosis not present

## 2021-07-28 DIAGNOSIS — Z792 Long term (current) use of antibiotics: Secondary | ICD-10-CM | POA: Diagnosis not present

## 2021-07-28 DIAGNOSIS — Z96652 Presence of left artificial knee joint: Secondary | ICD-10-CM | POA: Diagnosis not present

## 2021-07-31 DIAGNOSIS — K219 Gastro-esophageal reflux disease without esophagitis: Secondary | ICD-10-CM | POA: Diagnosis not present

## 2021-07-31 DIAGNOSIS — K5900 Constipation, unspecified: Secondary | ICD-10-CM | POA: Diagnosis not present

## 2021-07-31 DIAGNOSIS — M109 Gout, unspecified: Secondary | ICD-10-CM | POA: Diagnosis not present

## 2021-07-31 DIAGNOSIS — M419 Scoliosis, unspecified: Secondary | ICD-10-CM | POA: Diagnosis not present

## 2021-07-31 DIAGNOSIS — Z940 Kidney transplant status: Secondary | ICD-10-CM | POA: Diagnosis not present

## 2021-07-31 DIAGNOSIS — I4891 Unspecified atrial fibrillation: Secondary | ICD-10-CM | POA: Diagnosis not present

## 2021-07-31 DIAGNOSIS — I129 Hypertensive chronic kidney disease with stage 1 through stage 4 chronic kidney disease, or unspecified chronic kidney disease: Secondary | ICD-10-CM | POA: Diagnosis not present

## 2021-07-31 DIAGNOSIS — M5116 Intervertebral disc disorders with radiculopathy, lumbar region: Secondary | ICD-10-CM | POA: Diagnosis not present

## 2021-07-31 DIAGNOSIS — I4510 Unspecified right bundle-branch block: Secondary | ICD-10-CM | POA: Diagnosis not present

## 2021-07-31 DIAGNOSIS — M19019 Primary osteoarthritis, unspecified shoulder: Secondary | ICD-10-CM | POA: Diagnosis not present

## 2021-07-31 DIAGNOSIS — Z792 Long term (current) use of antibiotics: Secondary | ICD-10-CM | POA: Diagnosis not present

## 2021-07-31 DIAGNOSIS — I739 Peripheral vascular disease, unspecified: Secondary | ICD-10-CM | POA: Diagnosis not present

## 2021-07-31 DIAGNOSIS — M169 Osteoarthritis of hip, unspecified: Secondary | ICD-10-CM | POA: Diagnosis not present

## 2021-07-31 DIAGNOSIS — Z96652 Presence of left artificial knee joint: Secondary | ICD-10-CM | POA: Diagnosis not present

## 2021-07-31 DIAGNOSIS — E7800 Pure hypercholesterolemia, unspecified: Secondary | ICD-10-CM | POA: Diagnosis not present

## 2021-07-31 DIAGNOSIS — N189 Chronic kidney disease, unspecified: Secondary | ICD-10-CM | POA: Diagnosis not present

## 2021-07-31 DIAGNOSIS — Z9049 Acquired absence of other specified parts of digestive tract: Secondary | ICD-10-CM | POA: Diagnosis not present

## 2021-07-31 DIAGNOSIS — Z471 Aftercare following joint replacement surgery: Secondary | ICD-10-CM | POA: Diagnosis not present

## 2021-08-02 DIAGNOSIS — R6889 Other general symptoms and signs: Secondary | ICD-10-CM | POA: Diagnosis not present

## 2021-08-02 DIAGNOSIS — Z9181 History of falling: Secondary | ICD-10-CM | POA: Diagnosis not present

## 2021-08-02 DIAGNOSIS — Z96652 Presence of left artificial knee joint: Secondary | ICD-10-CM | POA: Diagnosis not present

## 2021-08-02 DIAGNOSIS — M1712 Unilateral primary osteoarthritis, left knee: Secondary | ICD-10-CM | POA: Diagnosis not present

## 2021-08-02 DIAGNOSIS — M2560 Stiffness of unspecified joint, not elsewhere classified: Secondary | ICD-10-CM | POA: Diagnosis not present

## 2021-08-02 DIAGNOSIS — R262 Difficulty in walking, not elsewhere classified: Secondary | ICD-10-CM | POA: Diagnosis not present

## 2021-08-02 DIAGNOSIS — Z471 Aftercare following joint replacement surgery: Secondary | ICD-10-CM | POA: Diagnosis not present

## 2021-08-04 DIAGNOSIS — Z96652 Presence of left artificial knee joint: Secondary | ICD-10-CM | POA: Diagnosis not present

## 2021-08-04 DIAGNOSIS — M1712 Unilateral primary osteoarthritis, left knee: Secondary | ICD-10-CM | POA: Diagnosis not present

## 2021-08-04 DIAGNOSIS — M2560 Stiffness of unspecified joint, not elsewhere classified: Secondary | ICD-10-CM | POA: Diagnosis not present

## 2021-08-04 DIAGNOSIS — Z471 Aftercare following joint replacement surgery: Secondary | ICD-10-CM | POA: Diagnosis not present

## 2021-08-04 DIAGNOSIS — R262 Difficulty in walking, not elsewhere classified: Secondary | ICD-10-CM | POA: Diagnosis not present

## 2021-08-04 DIAGNOSIS — R6889 Other general symptoms and signs: Secondary | ICD-10-CM | POA: Diagnosis not present

## 2021-08-08 DIAGNOSIS — R262 Difficulty in walking, not elsewhere classified: Secondary | ICD-10-CM | POA: Diagnosis not present

## 2021-08-08 DIAGNOSIS — M2560 Stiffness of unspecified joint, not elsewhere classified: Secondary | ICD-10-CM | POA: Diagnosis not present

## 2021-08-08 DIAGNOSIS — Z9181 History of falling: Secondary | ICD-10-CM | POA: Diagnosis not present

## 2021-08-08 DIAGNOSIS — Z471 Aftercare following joint replacement surgery: Secondary | ICD-10-CM | POA: Diagnosis not present

## 2021-08-08 DIAGNOSIS — M6281 Muscle weakness (generalized): Secondary | ICD-10-CM | POA: Diagnosis not present

## 2021-08-08 DIAGNOSIS — M1712 Unilateral primary osteoarthritis, left knee: Secondary | ICD-10-CM | POA: Diagnosis not present

## 2021-08-08 DIAGNOSIS — R6889 Other general symptoms and signs: Secondary | ICD-10-CM | POA: Diagnosis not present

## 2021-08-08 DIAGNOSIS — Z96652 Presence of left artificial knee joint: Secondary | ICD-10-CM | POA: Diagnosis not present

## 2021-08-10 DIAGNOSIS — Z471 Aftercare following joint replacement surgery: Secondary | ICD-10-CM | POA: Diagnosis not present

## 2021-08-10 DIAGNOSIS — Z96652 Presence of left artificial knee joint: Secondary | ICD-10-CM | POA: Diagnosis not present

## 2021-08-10 DIAGNOSIS — Z9181 History of falling: Secondary | ICD-10-CM | POA: Diagnosis not present

## 2021-08-10 DIAGNOSIS — M1712 Unilateral primary osteoarthritis, left knee: Secondary | ICD-10-CM | POA: Diagnosis not present

## 2021-08-10 DIAGNOSIS — M2560 Stiffness of unspecified joint, not elsewhere classified: Secondary | ICD-10-CM | POA: Diagnosis not present

## 2021-08-10 DIAGNOSIS — R6889 Other general symptoms and signs: Secondary | ICD-10-CM | POA: Diagnosis not present

## 2021-08-10 DIAGNOSIS — R262 Difficulty in walking, not elsewhere classified: Secondary | ICD-10-CM | POA: Diagnosis not present

## 2021-08-10 DIAGNOSIS — M6281 Muscle weakness (generalized): Secondary | ICD-10-CM | POA: Diagnosis not present

## 2021-08-15 DIAGNOSIS — Z471 Aftercare following joint replacement surgery: Secondary | ICD-10-CM | POA: Diagnosis not present

## 2021-08-15 DIAGNOSIS — Z96652 Presence of left artificial knee joint: Secondary | ICD-10-CM | POA: Diagnosis not present

## 2021-08-15 DIAGNOSIS — M1712 Unilateral primary osteoarthritis, left knee: Secondary | ICD-10-CM | POA: Diagnosis not present

## 2021-08-15 DIAGNOSIS — M2560 Stiffness of unspecified joint, not elsewhere classified: Secondary | ICD-10-CM | POA: Diagnosis not present

## 2021-08-15 DIAGNOSIS — R6889 Other general symptoms and signs: Secondary | ICD-10-CM | POA: Diagnosis not present

## 2021-08-15 DIAGNOSIS — R262 Difficulty in walking, not elsewhere classified: Secondary | ICD-10-CM | POA: Diagnosis not present

## 2021-08-15 DIAGNOSIS — M6281 Muscle weakness (generalized): Secondary | ICD-10-CM | POA: Diagnosis not present

## 2021-08-15 DIAGNOSIS — Z9181 History of falling: Secondary | ICD-10-CM | POA: Diagnosis not present

## 2021-08-22 DIAGNOSIS — R6889 Other general symptoms and signs: Secondary | ICD-10-CM | POA: Diagnosis not present

## 2021-08-22 DIAGNOSIS — Z96652 Presence of left artificial knee joint: Secondary | ICD-10-CM | POA: Diagnosis not present

## 2021-08-22 DIAGNOSIS — R262 Difficulty in walking, not elsewhere classified: Secondary | ICD-10-CM | POA: Diagnosis not present

## 2021-08-22 DIAGNOSIS — Z471 Aftercare following joint replacement surgery: Secondary | ICD-10-CM | POA: Diagnosis not present

## 2021-08-22 DIAGNOSIS — M1712 Unilateral primary osteoarthritis, left knee: Secondary | ICD-10-CM | POA: Diagnosis not present

## 2021-08-22 DIAGNOSIS — M2560 Stiffness of unspecified joint, not elsewhere classified: Secondary | ICD-10-CM | POA: Diagnosis not present

## 2021-08-29 DIAGNOSIS — R7989 Other specified abnormal findings of blood chemistry: Secondary | ICD-10-CM | POA: Diagnosis not present

## 2021-08-29 DIAGNOSIS — Z940 Kidney transplant status: Secondary | ICD-10-CM | POA: Diagnosis not present

## 2021-09-01 DIAGNOSIS — N390 Urinary tract infection, site not specified: Secondary | ICD-10-CM | POA: Diagnosis not present

## 2021-09-01 DIAGNOSIS — I10 Essential (primary) hypertension: Secondary | ICD-10-CM | POA: Diagnosis not present

## 2021-09-01 DIAGNOSIS — J449 Chronic obstructive pulmonary disease, unspecified: Secondary | ICD-10-CM | POA: Diagnosis not present

## 2021-09-01 DIAGNOSIS — Z940 Kidney transplant status: Secondary | ICD-10-CM | POA: Diagnosis not present

## 2021-09-05 DIAGNOSIS — Z96652 Presence of left artificial knee joint: Secondary | ICD-10-CM | POA: Diagnosis not present

## 2021-09-05 DIAGNOSIS — Z471 Aftercare following joint replacement surgery: Secondary | ICD-10-CM | POA: Diagnosis not present

## 2021-10-11 DIAGNOSIS — I10 Essential (primary) hypertension: Secondary | ICD-10-CM | POA: Diagnosis not present

## 2021-10-11 DIAGNOSIS — E559 Vitamin D deficiency, unspecified: Secondary | ICD-10-CM | POA: Diagnosis not present

## 2021-10-11 DIAGNOSIS — E7800 Pure hypercholesterolemia, unspecified: Secondary | ICD-10-CM | POA: Diagnosis not present

## 2021-10-11 DIAGNOSIS — R7303 Prediabetes: Secondary | ICD-10-CM | POA: Diagnosis not present

## 2021-10-11 DIAGNOSIS — Z0001 Encounter for general adult medical examination with abnormal findings: Secondary | ICD-10-CM | POA: Diagnosis not present

## 2021-10-11 DIAGNOSIS — M1712 Unilateral primary osteoarthritis, left knee: Secondary | ICD-10-CM | POA: Diagnosis not present

## 2021-10-11 DIAGNOSIS — E538 Deficiency of other specified B group vitamins: Secondary | ICD-10-CM | POA: Diagnosis not present

## 2021-10-30 DIAGNOSIS — N185 Chronic kidney disease, stage 5: Secondary | ICD-10-CM | POA: Diagnosis not present

## 2021-10-30 DIAGNOSIS — I119 Hypertensive heart disease without heart failure: Secondary | ICD-10-CM | POA: Diagnosis not present

## 2021-10-30 DIAGNOSIS — K219 Gastro-esophageal reflux disease without esophagitis: Secondary | ICD-10-CM | POA: Diagnosis not present

## 2021-12-08 DIAGNOSIS — Z940 Kidney transplant status: Secondary | ICD-10-CM | POA: Diagnosis not present

## 2021-12-26 DIAGNOSIS — R9431 Abnormal electrocardiogram [ECG] [EKG]: Secondary | ICD-10-CM | POA: Diagnosis not present

## 2021-12-26 DIAGNOSIS — Z96659 Presence of unspecified artificial knee joint: Secondary | ICD-10-CM | POA: Diagnosis not present

## 2021-12-26 DIAGNOSIS — E785 Hyperlipidemia, unspecified: Secondary | ICD-10-CM | POA: Diagnosis not present

## 2021-12-26 DIAGNOSIS — M10032 Idiopathic gout, left wrist: Secondary | ICD-10-CM | POA: Diagnosis not present

## 2021-12-26 DIAGNOSIS — E876 Hypokalemia: Secondary | ICD-10-CM | POA: Diagnosis not present

## 2021-12-26 DIAGNOSIS — M25532 Pain in left wrist: Secondary | ICD-10-CM | POA: Diagnosis not present

## 2021-12-26 DIAGNOSIS — N186 End stage renal disease: Secondary | ICD-10-CM | POA: Diagnosis not present

## 2021-12-26 DIAGNOSIS — R262 Difficulty in walking, not elsewhere classified: Secondary | ICD-10-CM | POA: Diagnosis not present

## 2021-12-26 DIAGNOSIS — M109 Gout, unspecified: Secondary | ICD-10-CM | POA: Diagnosis not present

## 2021-12-26 DIAGNOSIS — I10 Essential (primary) hypertension: Secondary | ICD-10-CM | POA: Diagnosis not present

## 2021-12-26 DIAGNOSIS — D649 Anemia, unspecified: Secondary | ICD-10-CM | POA: Diagnosis not present

## 2021-12-26 DIAGNOSIS — Z79899 Other long term (current) drug therapy: Secondary | ICD-10-CM | POA: Diagnosis not present

## 2021-12-26 DIAGNOSIS — N185 Chronic kidney disease, stage 5: Secondary | ICD-10-CM | POA: Diagnosis not present

## 2021-12-26 DIAGNOSIS — I4581 Long QT syndrome: Secondary | ICD-10-CM | POA: Diagnosis not present

## 2021-12-26 DIAGNOSIS — R197 Diarrhea, unspecified: Secondary | ICD-10-CM | POA: Diagnosis not present

## 2021-12-26 DIAGNOSIS — I480 Paroxysmal atrial fibrillation: Secondary | ICD-10-CM | POA: Diagnosis not present

## 2021-12-26 DIAGNOSIS — I120 Hypertensive chronic kidney disease with stage 5 chronic kidney disease or end stage renal disease: Secondary | ICD-10-CM | POA: Diagnosis not present

## 2021-12-26 DIAGNOSIS — G8929 Other chronic pain: Secondary | ICD-10-CM | POA: Diagnosis not present

## 2021-12-26 DIAGNOSIS — R54 Age-related physical debility: Secondary | ICD-10-CM | POA: Diagnosis not present

## 2021-12-26 DIAGNOSIS — R2681 Unsteadiness on feet: Secondary | ICD-10-CM | POA: Diagnosis not present

## 2021-12-26 DIAGNOSIS — I4510 Unspecified right bundle-branch block: Secondary | ICD-10-CM | POA: Diagnosis not present

## 2021-12-26 DIAGNOSIS — Z8719 Personal history of other diseases of the digestive system: Secondary | ICD-10-CM | POA: Diagnosis not present

## 2021-12-26 DIAGNOSIS — T8611 Kidney transplant rejection: Secondary | ICD-10-CM | POA: Diagnosis not present

## 2021-12-26 DIAGNOSIS — T8612 Kidney transplant failure: Secondary | ICD-10-CM | POA: Diagnosis not present

## 2021-12-26 DIAGNOSIS — Z940 Kidney transplant status: Secondary | ICD-10-CM | POA: Diagnosis not present

## 2021-12-26 DIAGNOSIS — M6281 Muscle weakness (generalized): Secondary | ICD-10-CM | POA: Diagnosis not present

## 2021-12-26 DIAGNOSIS — M3210 Systemic lupus erythematosus, organ or system involvement unspecified: Secondary | ICD-10-CM | POA: Diagnosis not present

## 2021-12-26 DIAGNOSIS — R531 Weakness: Secondary | ICD-10-CM | POA: Diagnosis not present

## 2021-12-26 DIAGNOSIS — I4891 Unspecified atrial fibrillation: Secondary | ICD-10-CM | POA: Diagnosis not present

## 2021-12-26 DIAGNOSIS — R5381 Other malaise: Secondary | ICD-10-CM | POA: Diagnosis not present

## 2021-12-26 DIAGNOSIS — M6250 Muscle wasting and atrophy, not elsewhere classified, unspecified site: Secondary | ICD-10-CM | POA: Diagnosis not present

## 2021-12-26 DIAGNOSIS — N183 Chronic kidney disease, stage 3 unspecified: Secondary | ICD-10-CM | POA: Diagnosis not present

## 2021-12-27 DIAGNOSIS — R9431 Abnormal electrocardiogram [ECG] [EKG]: Secondary | ICD-10-CM | POA: Diagnosis not present

## 2021-12-27 DIAGNOSIS — N186 End stage renal disease: Secondary | ICD-10-CM | POA: Diagnosis not present

## 2021-12-27 DIAGNOSIS — E785 Hyperlipidemia, unspecified: Secondary | ICD-10-CM | POA: Diagnosis not present

## 2021-12-27 DIAGNOSIS — R197 Diarrhea, unspecified: Secondary | ICD-10-CM | POA: Diagnosis not present

## 2021-12-27 DIAGNOSIS — I4891 Unspecified atrial fibrillation: Secondary | ICD-10-CM | POA: Diagnosis not present

## 2021-12-27 DIAGNOSIS — M109 Gout, unspecified: Secondary | ICD-10-CM | POA: Diagnosis not present

## 2021-12-27 DIAGNOSIS — I120 Hypertensive chronic kidney disease with stage 5 chronic kidney disease or end stage renal disease: Secondary | ICD-10-CM | POA: Diagnosis not present

## 2021-12-27 DIAGNOSIS — N183 Chronic kidney disease, stage 3 unspecified: Secondary | ICD-10-CM | POA: Diagnosis not present

## 2021-12-27 DIAGNOSIS — E876 Hypokalemia: Secondary | ICD-10-CM | POA: Diagnosis not present

## 2021-12-27 DIAGNOSIS — M25532 Pain in left wrist: Secondary | ICD-10-CM | POA: Diagnosis not present

## 2021-12-28 DIAGNOSIS — E785 Hyperlipidemia, unspecified: Secondary | ICD-10-CM | POA: Diagnosis not present

## 2021-12-28 DIAGNOSIS — I4510 Unspecified right bundle-branch block: Secondary | ICD-10-CM | POA: Diagnosis not present

## 2021-12-28 DIAGNOSIS — R197 Diarrhea, unspecified: Secondary | ICD-10-CM | POA: Diagnosis not present

## 2021-12-28 DIAGNOSIS — N183 Chronic kidney disease, stage 3 unspecified: Secondary | ICD-10-CM | POA: Diagnosis not present

## 2021-12-28 DIAGNOSIS — N186 End stage renal disease: Secondary | ICD-10-CM | POA: Diagnosis not present

## 2021-12-28 DIAGNOSIS — Z940 Kidney transplant status: Secondary | ICD-10-CM | POA: Diagnosis not present

## 2021-12-28 DIAGNOSIS — E876 Hypokalemia: Secondary | ICD-10-CM | POA: Diagnosis not present

## 2021-12-28 DIAGNOSIS — R9431 Abnormal electrocardiogram [ECG] [EKG]: Secondary | ICD-10-CM | POA: Diagnosis not present

## 2021-12-28 DIAGNOSIS — Z79899 Other long term (current) drug therapy: Secondary | ICD-10-CM | POA: Diagnosis not present

## 2021-12-28 DIAGNOSIS — I120 Hypertensive chronic kidney disease with stage 5 chronic kidney disease or end stage renal disease: Secondary | ICD-10-CM | POA: Diagnosis not present

## 2021-12-28 DIAGNOSIS — M109 Gout, unspecified: Secondary | ICD-10-CM | POA: Diagnosis not present

## 2021-12-28 DIAGNOSIS — I4891 Unspecified atrial fibrillation: Secondary | ICD-10-CM | POA: Diagnosis not present

## 2021-12-29 DIAGNOSIS — N183 Chronic kidney disease, stage 3 unspecified: Secondary | ICD-10-CM | POA: Diagnosis not present

## 2021-12-29 DIAGNOSIS — M109 Gout, unspecified: Secondary | ICD-10-CM | POA: Diagnosis not present

## 2021-12-29 DIAGNOSIS — E876 Hypokalemia: Secondary | ICD-10-CM | POA: Diagnosis not present

## 2021-12-29 DIAGNOSIS — I4891 Unspecified atrial fibrillation: Secondary | ICD-10-CM | POA: Diagnosis not present

## 2021-12-29 DIAGNOSIS — R9431 Abnormal electrocardiogram [ECG] [EKG]: Secondary | ICD-10-CM | POA: Diagnosis not present

## 2021-12-29 DIAGNOSIS — I120 Hypertensive chronic kidney disease with stage 5 chronic kidney disease or end stage renal disease: Secondary | ICD-10-CM | POA: Diagnosis not present

## 2021-12-29 DIAGNOSIS — N186 End stage renal disease: Secondary | ICD-10-CM | POA: Diagnosis not present

## 2021-12-29 DIAGNOSIS — M25532 Pain in left wrist: Secondary | ICD-10-CM | POA: Diagnosis not present

## 2021-12-29 DIAGNOSIS — E785 Hyperlipidemia, unspecified: Secondary | ICD-10-CM | POA: Diagnosis not present

## 2021-12-30 DIAGNOSIS — M109 Gout, unspecified: Secondary | ICD-10-CM | POA: Diagnosis not present

## 2021-12-30 DIAGNOSIS — M6281 Muscle weakness (generalized): Secondary | ICD-10-CM | POA: Diagnosis not present

## 2021-12-30 DIAGNOSIS — R531 Weakness: Secondary | ICD-10-CM | POA: Diagnosis not present

## 2021-12-30 DIAGNOSIS — R2681 Unsteadiness on feet: Secondary | ICD-10-CM | POA: Diagnosis not present

## 2021-12-30 DIAGNOSIS — N183 Chronic kidney disease, stage 3 unspecified: Secondary | ICD-10-CM | POA: Diagnosis not present

## 2021-12-30 DIAGNOSIS — R9431 Abnormal electrocardiogram [ECG] [EKG]: Secondary | ICD-10-CM | POA: Diagnosis not present

## 2021-12-30 DIAGNOSIS — N185 Chronic kidney disease, stage 5: Secondary | ICD-10-CM | POA: Diagnosis not present

## 2021-12-30 DIAGNOSIS — Z8719 Personal history of other diseases of the digestive system: Secondary | ICD-10-CM | POA: Diagnosis not present

## 2021-12-30 DIAGNOSIS — Z940 Kidney transplant status: Secondary | ICD-10-CM | POA: Diagnosis not present

## 2021-12-30 DIAGNOSIS — R54 Age-related physical debility: Secondary | ICD-10-CM | POA: Diagnosis not present

## 2021-12-30 DIAGNOSIS — I4891 Unspecified atrial fibrillation: Secondary | ICD-10-CM | POA: Diagnosis not present

## 2021-12-30 DIAGNOSIS — M10322 Gout due to renal impairment, left elbow: Secondary | ICD-10-CM | POA: Diagnosis not present

## 2021-12-30 DIAGNOSIS — M10032 Idiopathic gout, left wrist: Secondary | ICD-10-CM | POA: Diagnosis not present

## 2021-12-30 DIAGNOSIS — R52 Pain, unspecified: Secondary | ICD-10-CM | POA: Diagnosis not present

## 2021-12-30 DIAGNOSIS — E876 Hypokalemia: Secondary | ICD-10-CM | POA: Diagnosis not present

## 2021-12-30 DIAGNOSIS — M25532 Pain in left wrist: Secondary | ICD-10-CM | POA: Diagnosis not present

## 2021-12-30 DIAGNOSIS — R5381 Other malaise: Secondary | ICD-10-CM | POA: Diagnosis not present

## 2021-12-30 DIAGNOSIS — E785 Hyperlipidemia, unspecified: Secondary | ICD-10-CM | POA: Diagnosis not present

## 2021-12-30 DIAGNOSIS — I480 Paroxysmal atrial fibrillation: Secondary | ICD-10-CM | POA: Diagnosis not present

## 2021-12-30 DIAGNOSIS — M6250 Muscle wasting and atrophy, not elsewhere classified, unspecified site: Secondary | ICD-10-CM | POA: Diagnosis not present

## 2021-12-30 DIAGNOSIS — R262 Difficulty in walking, not elsewhere classified: Secondary | ICD-10-CM | POA: Diagnosis not present

## 2021-12-30 DIAGNOSIS — D649 Anemia, unspecified: Secondary | ICD-10-CM | POA: Diagnosis not present

## 2021-12-30 DIAGNOSIS — M3210 Systemic lupus erythematosus, organ or system involvement unspecified: Secondary | ICD-10-CM | POA: Diagnosis not present

## 2021-12-30 DIAGNOSIS — N186 End stage renal disease: Secondary | ICD-10-CM | POA: Diagnosis not present

## 2021-12-30 DIAGNOSIS — I120 Hypertensive chronic kidney disease with stage 5 chronic kidney disease or end stage renal disease: Secondary | ICD-10-CM | POA: Diagnosis not present

## 2021-12-30 DIAGNOSIS — I10 Essential (primary) hypertension: Secondary | ICD-10-CM | POA: Diagnosis not present

## 2021-12-30 DIAGNOSIS — Z96659 Presence of unspecified artificial knee joint: Secondary | ICD-10-CM | POA: Diagnosis not present

## 2022-01-01 DIAGNOSIS — M10322 Gout due to renal impairment, left elbow: Secondary | ICD-10-CM | POA: Diagnosis not present

## 2022-01-01 DIAGNOSIS — R2681 Unsteadiness on feet: Secondary | ICD-10-CM | POA: Diagnosis not present

## 2022-01-01 DIAGNOSIS — I480 Paroxysmal atrial fibrillation: Secondary | ICD-10-CM | POA: Diagnosis not present

## 2022-01-01 DIAGNOSIS — R52 Pain, unspecified: Secondary | ICD-10-CM | POA: Diagnosis not present

## 2022-01-01 DIAGNOSIS — M3210 Systemic lupus erythematosus, organ or system involvement unspecified: Secondary | ICD-10-CM | POA: Diagnosis not present

## 2022-01-01 DIAGNOSIS — R54 Age-related physical debility: Secondary | ICD-10-CM | POA: Diagnosis not present

## 2022-01-01 DIAGNOSIS — R531 Weakness: Secondary | ICD-10-CM | POA: Diagnosis not present

## 2022-01-02 DIAGNOSIS — T8610 Unspecified complication of kidney transplant: Secondary | ICD-10-CM | POA: Diagnosis not present

## 2022-01-02 DIAGNOSIS — G8929 Other chronic pain: Secondary | ICD-10-CM | POA: Diagnosis not present

## 2022-01-02 DIAGNOSIS — N186 End stage renal disease: Secondary | ICD-10-CM | POA: Diagnosis not present

## 2022-01-02 DIAGNOSIS — I10 Essential (primary) hypertension: Secondary | ICD-10-CM | POA: Diagnosis not present

## 2022-01-02 DIAGNOSIS — E876 Hypokalemia: Secondary | ICD-10-CM | POA: Diagnosis not present

## 2022-01-02 DIAGNOSIS — M109 Gout, unspecified: Secondary | ICD-10-CM | POA: Diagnosis not present

## 2022-01-02 DIAGNOSIS — N185 Chronic kidney disease, stage 5: Secondary | ICD-10-CM | POA: Diagnosis not present

## 2022-01-02 DIAGNOSIS — I120 Hypertensive chronic kidney disease with stage 5 chronic kidney disease or end stage renal disease: Secondary | ICD-10-CM | POA: Diagnosis not present

## 2022-01-02 DIAGNOSIS — R5381 Other malaise: Secondary | ICD-10-CM | POA: Diagnosis not present

## 2022-01-02 DIAGNOSIS — I4581 Long QT syndrome: Secondary | ICD-10-CM | POA: Diagnosis not present

## 2022-01-02 DIAGNOSIS — I4891 Unspecified atrial fibrillation: Secondary | ICD-10-CM | POA: Diagnosis not present

## 2022-01-02 DIAGNOSIS — R197 Diarrhea, unspecified: Secondary | ICD-10-CM | POA: Diagnosis not present

## 2022-01-02 DIAGNOSIS — R531 Weakness: Secondary | ICD-10-CM | POA: Diagnosis not present

## 2022-01-02 DIAGNOSIS — M10032 Idiopathic gout, left wrist: Secondary | ICD-10-CM | POA: Diagnosis not present

## 2022-01-02 DIAGNOSIS — Z992 Dependence on renal dialysis: Secondary | ICD-10-CM | POA: Diagnosis not present

## 2022-01-02 DIAGNOSIS — N179 Acute kidney failure, unspecified: Secondary | ICD-10-CM | POA: Diagnosis not present

## 2022-01-02 DIAGNOSIS — I480 Paroxysmal atrial fibrillation: Secondary | ICD-10-CM | POA: Diagnosis not present

## 2022-01-02 DIAGNOSIS — E785 Hyperlipidemia, unspecified: Secondary | ICD-10-CM | POA: Diagnosis not present

## 2022-01-02 DIAGNOSIS — T8611 Kidney transplant rejection: Secondary | ICD-10-CM | POA: Diagnosis not present

## 2022-01-02 DIAGNOSIS — R600 Localized edema: Secondary | ICD-10-CM | POA: Diagnosis not present

## 2022-01-02 DIAGNOSIS — R54 Age-related physical debility: Secondary | ICD-10-CM | POA: Diagnosis not present

## 2022-01-03 DIAGNOSIS — R5381 Other malaise: Secondary | ICD-10-CM | POA: Diagnosis not present

## 2022-01-03 DIAGNOSIS — I10 Essential (primary) hypertension: Secondary | ICD-10-CM | POA: Diagnosis not present

## 2022-01-03 DIAGNOSIS — R197 Diarrhea, unspecified: Secondary | ICD-10-CM | POA: Diagnosis not present

## 2022-01-03 DIAGNOSIS — I4891 Unspecified atrial fibrillation: Secondary | ICD-10-CM | POA: Diagnosis not present

## 2022-01-03 DIAGNOSIS — M109 Gout, unspecified: Secondary | ICD-10-CM | POA: Diagnosis not present

## 2022-01-03 DIAGNOSIS — E785 Hyperlipidemia, unspecified: Secondary | ICD-10-CM | POA: Diagnosis not present

## 2022-01-03 DIAGNOSIS — N179 Acute kidney failure, unspecified: Secondary | ICD-10-CM | POA: Diagnosis not present

## 2022-01-04 DIAGNOSIS — T8610 Unspecified complication of kidney transplant: Secondary | ICD-10-CM | POA: Diagnosis not present

## 2022-01-04 DIAGNOSIS — N186 End stage renal disease: Secondary | ICD-10-CM | POA: Diagnosis not present

## 2022-01-04 DIAGNOSIS — M109 Gout, unspecified: Secondary | ICD-10-CM | POA: Diagnosis not present

## 2022-01-04 DIAGNOSIS — I4891 Unspecified atrial fibrillation: Secondary | ICD-10-CM | POA: Diagnosis not present

## 2022-01-04 DIAGNOSIS — I10 Essential (primary) hypertension: Secondary | ICD-10-CM | POA: Diagnosis not present

## 2022-01-05 DIAGNOSIS — I120 Hypertensive chronic kidney disease with stage 5 chronic kidney disease or end stage renal disease: Secondary | ICD-10-CM | POA: Diagnosis not present

## 2022-01-10 DIAGNOSIS — I4891 Unspecified atrial fibrillation: Secondary | ICD-10-CM | POA: Diagnosis not present

## 2022-01-10 DIAGNOSIS — R5381 Other malaise: Secondary | ICD-10-CM | POA: Diagnosis not present

## 2022-01-10 DIAGNOSIS — R600 Localized edema: Secondary | ICD-10-CM | POA: Diagnosis not present

## 2022-01-10 DIAGNOSIS — I10 Essential (primary) hypertension: Secondary | ICD-10-CM | POA: Diagnosis not present

## 2022-01-10 DIAGNOSIS — N186 End stage renal disease: Secondary | ICD-10-CM | POA: Diagnosis not present

## 2022-01-17 DIAGNOSIS — I120 Hypertensive chronic kidney disease with stage 5 chronic kidney disease or end stage renal disease: Secondary | ICD-10-CM | POA: Diagnosis not present

## 2022-01-17 DIAGNOSIS — N186 End stage renal disease: Secondary | ICD-10-CM | POA: Diagnosis not present

## 2022-01-17 DIAGNOSIS — T8619 Other complication of kidney transplant: Secondary | ICD-10-CM | POA: Diagnosis not present

## 2022-01-17 DIAGNOSIS — I480 Paroxysmal atrial fibrillation: Secondary | ICD-10-CM | POA: Diagnosis not present

## 2022-01-17 DIAGNOSIS — N179 Acute kidney failure, unspecified: Secondary | ICD-10-CM | POA: Diagnosis not present

## 2022-01-18 DIAGNOSIS — I120 Hypertensive chronic kidney disease with stage 5 chronic kidney disease or end stage renal disease: Secondary | ICD-10-CM | POA: Diagnosis not present

## 2022-01-18 DIAGNOSIS — T8619 Other complication of kidney transplant: Secondary | ICD-10-CM | POA: Diagnosis not present

## 2022-01-18 DIAGNOSIS — N186 End stage renal disease: Secondary | ICD-10-CM | POA: Diagnosis not present

## 2022-01-18 DIAGNOSIS — I480 Paroxysmal atrial fibrillation: Secondary | ICD-10-CM | POA: Diagnosis not present

## 2022-01-18 DIAGNOSIS — N179 Acute kidney failure, unspecified: Secondary | ICD-10-CM | POA: Diagnosis not present

## 2022-01-19 DIAGNOSIS — I120 Hypertensive chronic kidney disease with stage 5 chronic kidney disease or end stage renal disease: Secondary | ICD-10-CM | POA: Diagnosis not present

## 2022-01-19 DIAGNOSIS — T8619 Other complication of kidney transplant: Secondary | ICD-10-CM | POA: Diagnosis not present

## 2022-01-19 DIAGNOSIS — N179 Acute kidney failure, unspecified: Secondary | ICD-10-CM | POA: Diagnosis not present

## 2022-01-19 DIAGNOSIS — I480 Paroxysmal atrial fibrillation: Secondary | ICD-10-CM | POA: Diagnosis not present

## 2022-01-19 DIAGNOSIS — N186 End stage renal disease: Secondary | ICD-10-CM | POA: Diagnosis not present

## 2022-01-23 DIAGNOSIS — N179 Acute kidney failure, unspecified: Secondary | ICD-10-CM | POA: Diagnosis not present

## 2022-01-23 DIAGNOSIS — T8619 Other complication of kidney transplant: Secondary | ICD-10-CM | POA: Diagnosis not present

## 2022-01-23 DIAGNOSIS — I480 Paroxysmal atrial fibrillation: Secondary | ICD-10-CM | POA: Diagnosis not present

## 2022-01-23 DIAGNOSIS — I120 Hypertensive chronic kidney disease with stage 5 chronic kidney disease or end stage renal disease: Secondary | ICD-10-CM | POA: Diagnosis not present

## 2022-01-23 DIAGNOSIS — N186 End stage renal disease: Secondary | ICD-10-CM | POA: Diagnosis not present

## 2022-01-24 DIAGNOSIS — N179 Acute kidney failure, unspecified: Secondary | ICD-10-CM | POA: Diagnosis not present

## 2022-01-24 DIAGNOSIS — I120 Hypertensive chronic kidney disease with stage 5 chronic kidney disease or end stage renal disease: Secondary | ICD-10-CM | POA: Diagnosis not present

## 2022-01-24 DIAGNOSIS — N186 End stage renal disease: Secondary | ICD-10-CM | POA: Diagnosis not present

## 2022-01-24 DIAGNOSIS — I480 Paroxysmal atrial fibrillation: Secondary | ICD-10-CM | POA: Diagnosis not present

## 2022-01-24 DIAGNOSIS — T8619 Other complication of kidney transplant: Secondary | ICD-10-CM | POA: Diagnosis not present

## 2022-01-25 DIAGNOSIS — I120 Hypertensive chronic kidney disease with stage 5 chronic kidney disease or end stage renal disease: Secondary | ICD-10-CM | POA: Diagnosis not present

## 2022-01-25 DIAGNOSIS — M1712 Unilateral primary osteoarthritis, left knee: Secondary | ICD-10-CM | POA: Diagnosis not present

## 2022-01-25 DIAGNOSIS — E7800 Pure hypercholesterolemia, unspecified: Secondary | ICD-10-CM | POA: Diagnosis not present

## 2022-01-25 DIAGNOSIS — I10 Essential (primary) hypertension: Secondary | ICD-10-CM | POA: Diagnosis not present

## 2022-01-25 DIAGNOSIS — M109 Gout, unspecified: Secondary | ICD-10-CM | POA: Diagnosis not present

## 2022-01-25 DIAGNOSIS — R262 Difficulty in walking, not elsewhere classified: Secondary | ICD-10-CM | POA: Diagnosis not present

## 2022-01-25 DIAGNOSIS — R7303 Prediabetes: Secondary | ICD-10-CM | POA: Diagnosis not present

## 2022-01-25 DIAGNOSIS — N186 End stage renal disease: Secondary | ICD-10-CM | POA: Diagnosis not present

## 2022-01-25 DIAGNOSIS — E538 Deficiency of other specified B group vitamins: Secondary | ICD-10-CM | POA: Diagnosis not present

## 2022-01-25 DIAGNOSIS — E559 Vitamin D deficiency, unspecified: Secondary | ICD-10-CM | POA: Diagnosis not present

## 2022-01-25 DIAGNOSIS — T8619 Other complication of kidney transplant: Secondary | ICD-10-CM | POA: Diagnosis not present

## 2022-01-25 DIAGNOSIS — N179 Acute kidney failure, unspecified: Secondary | ICD-10-CM | POA: Diagnosis not present

## 2022-01-25 DIAGNOSIS — I480 Paroxysmal atrial fibrillation: Secondary | ICD-10-CM | POA: Diagnosis not present

## 2022-01-30 DIAGNOSIS — N179 Acute kidney failure, unspecified: Secondary | ICD-10-CM | POA: Diagnosis not present

## 2022-01-30 DIAGNOSIS — I480 Paroxysmal atrial fibrillation: Secondary | ICD-10-CM | POA: Diagnosis not present

## 2022-01-30 DIAGNOSIS — T8619 Other complication of kidney transplant: Secondary | ICD-10-CM | POA: Diagnosis not present

## 2022-01-30 DIAGNOSIS — I120 Hypertensive chronic kidney disease with stage 5 chronic kidney disease or end stage renal disease: Secondary | ICD-10-CM | POA: Diagnosis not present

## 2022-01-30 DIAGNOSIS — N186 End stage renal disease: Secondary | ICD-10-CM | POA: Diagnosis not present

## 2022-01-31 DIAGNOSIS — I480 Paroxysmal atrial fibrillation: Secondary | ICD-10-CM | POA: Diagnosis not present

## 2022-01-31 DIAGNOSIS — I120 Hypertensive chronic kidney disease with stage 5 chronic kidney disease or end stage renal disease: Secondary | ICD-10-CM | POA: Diagnosis not present

## 2022-01-31 DIAGNOSIS — N186 End stage renal disease: Secondary | ICD-10-CM | POA: Diagnosis not present

## 2022-01-31 DIAGNOSIS — T8619 Other complication of kidney transplant: Secondary | ICD-10-CM | POA: Diagnosis not present

## 2022-01-31 DIAGNOSIS — N179 Acute kidney failure, unspecified: Secondary | ICD-10-CM | POA: Diagnosis not present

## 2022-02-02 DIAGNOSIS — N179 Acute kidney failure, unspecified: Secondary | ICD-10-CM | POA: Diagnosis not present

## 2022-02-02 DIAGNOSIS — I120 Hypertensive chronic kidney disease with stage 5 chronic kidney disease or end stage renal disease: Secondary | ICD-10-CM | POA: Diagnosis not present

## 2022-02-02 DIAGNOSIS — T8619 Other complication of kidney transplant: Secondary | ICD-10-CM | POA: Diagnosis not present

## 2022-02-02 DIAGNOSIS — N186 End stage renal disease: Secondary | ICD-10-CM | POA: Diagnosis not present

## 2022-02-02 DIAGNOSIS — I480 Paroxysmal atrial fibrillation: Secondary | ICD-10-CM | POA: Diagnosis not present

## 2022-02-06 DIAGNOSIS — I120 Hypertensive chronic kidney disease with stage 5 chronic kidney disease or end stage renal disease: Secondary | ICD-10-CM | POA: Diagnosis not present

## 2022-02-06 DIAGNOSIS — I480 Paroxysmal atrial fibrillation: Secondary | ICD-10-CM | POA: Diagnosis not present

## 2022-02-06 DIAGNOSIS — T8619 Other complication of kidney transplant: Secondary | ICD-10-CM | POA: Diagnosis not present

## 2022-02-06 DIAGNOSIS — N179 Acute kidney failure, unspecified: Secondary | ICD-10-CM | POA: Diagnosis not present

## 2022-02-06 DIAGNOSIS — N186 End stage renal disease: Secondary | ICD-10-CM | POA: Diagnosis not present

## 2022-02-07 DIAGNOSIS — I480 Paroxysmal atrial fibrillation: Secondary | ICD-10-CM | POA: Diagnosis not present

## 2022-02-07 DIAGNOSIS — T8619 Other complication of kidney transplant: Secondary | ICD-10-CM | POA: Diagnosis not present

## 2022-02-07 DIAGNOSIS — I120 Hypertensive chronic kidney disease with stage 5 chronic kidney disease or end stage renal disease: Secondary | ICD-10-CM | POA: Diagnosis not present

## 2022-02-07 DIAGNOSIS — N186 End stage renal disease: Secondary | ICD-10-CM | POA: Diagnosis not present

## 2022-02-07 DIAGNOSIS — N179 Acute kidney failure, unspecified: Secondary | ICD-10-CM | POA: Diagnosis not present

## 2022-02-12 DIAGNOSIS — E538 Deficiency of other specified B group vitamins: Secondary | ICD-10-CM | POA: Diagnosis not present

## 2022-02-12 DIAGNOSIS — M109 Gout, unspecified: Secondary | ICD-10-CM | POA: Diagnosis not present

## 2022-02-12 DIAGNOSIS — E7800 Pure hypercholesterolemia, unspecified: Secondary | ICD-10-CM | POA: Diagnosis not present

## 2022-02-12 DIAGNOSIS — R7303 Prediabetes: Secondary | ICD-10-CM | POA: Diagnosis not present

## 2022-02-12 DIAGNOSIS — E559 Vitamin D deficiency, unspecified: Secondary | ICD-10-CM | POA: Diagnosis not present

## 2022-02-12 DIAGNOSIS — I10 Essential (primary) hypertension: Secondary | ICD-10-CM | POA: Diagnosis not present

## 2022-02-12 DIAGNOSIS — M1712 Unilateral primary osteoarthritis, left knee: Secondary | ICD-10-CM | POA: Diagnosis not present

## 2022-05-29 DIAGNOSIS — N183 Chronic kidney disease, stage 3 unspecified: Secondary | ICD-10-CM | POA: Diagnosis not present

## 2022-05-29 DIAGNOSIS — R0602 Shortness of breath: Secondary | ICD-10-CM | POA: Diagnosis not present

## 2022-05-29 DIAGNOSIS — Z940 Kidney transplant status: Secondary | ICD-10-CM | POA: Diagnosis not present

## 2022-05-29 DIAGNOSIS — I10 Essential (primary) hypertension: Secondary | ICD-10-CM | POA: Diagnosis not present

## 2022-06-05 DIAGNOSIS — Z940 Kidney transplant status: Secondary | ICD-10-CM | POA: Diagnosis not present

## 2022-06-05 DIAGNOSIS — N183 Chronic kidney disease, stage 3 unspecified: Secondary | ICD-10-CM | POA: Diagnosis not present

## 2022-06-05 DIAGNOSIS — I10 Essential (primary) hypertension: Secondary | ICD-10-CM | POA: Diagnosis not present

## 2022-06-09 DIAGNOSIS — N179 Acute kidney failure, unspecified: Secondary | ICD-10-CM | POA: Diagnosis not present

## 2022-06-09 DIAGNOSIS — R600 Localized edema: Secondary | ICD-10-CM | POA: Diagnosis not present

## 2022-06-09 DIAGNOSIS — I480 Paroxysmal atrial fibrillation: Secondary | ICD-10-CM | POA: Diagnosis not present

## 2022-06-09 DIAGNOSIS — R7989 Other specified abnormal findings of blood chemistry: Secondary | ICD-10-CM | POA: Diagnosis not present

## 2022-06-09 DIAGNOSIS — R062 Wheezing: Secondary | ICD-10-CM | POA: Diagnosis not present

## 2022-06-09 DIAGNOSIS — D649 Anemia, unspecified: Secondary | ICD-10-CM | POA: Diagnosis not present

## 2022-06-09 DIAGNOSIS — R011 Cardiac murmur, unspecified: Secondary | ICD-10-CM | POA: Diagnosis not present

## 2022-06-09 DIAGNOSIS — Z940 Kidney transplant status: Secondary | ICD-10-CM | POA: Diagnosis not present

## 2022-06-09 DIAGNOSIS — I120 Hypertensive chronic kidney disease with stage 5 chronic kidney disease or end stage renal disease: Secondary | ICD-10-CM | POA: Diagnosis not present

## 2022-06-09 DIAGNOSIS — Z79899 Other long term (current) drug therapy: Secondary | ICD-10-CM | POA: Diagnosis not present

## 2022-06-09 DIAGNOSIS — D631 Anemia in chronic kidney disease: Secondary | ICD-10-CM | POA: Diagnosis not present

## 2022-06-09 DIAGNOSIS — R5381 Other malaise: Secondary | ICD-10-CM | POA: Diagnosis not present

## 2022-06-09 DIAGNOSIS — K219 Gastro-esophageal reflux disease without esophagitis: Secondary | ICD-10-CM | POA: Diagnosis not present

## 2022-06-09 DIAGNOSIS — Z6827 Body mass index (BMI) 27.0-27.9, adult: Secondary | ICD-10-CM | POA: Diagnosis not present

## 2022-06-09 DIAGNOSIS — E785 Hyperlipidemia, unspecified: Secondary | ICD-10-CM | POA: Diagnosis not present

## 2022-06-09 DIAGNOSIS — M3214 Glomerular disease in systemic lupus erythematosus: Secondary | ICD-10-CM | POA: Diagnosis not present

## 2022-06-09 DIAGNOSIS — R112 Nausea with vomiting, unspecified: Secondary | ICD-10-CM | POA: Diagnosis not present

## 2022-06-09 DIAGNOSIS — N186 End stage renal disease: Secondary | ICD-10-CM | POA: Diagnosis not present

## 2022-06-09 DIAGNOSIS — D849 Immunodeficiency, unspecified: Secondary | ICD-10-CM | POA: Diagnosis not present

## 2022-06-09 DIAGNOSIS — R5383 Other fatigue: Secondary | ICD-10-CM | POA: Diagnosis not present

## 2022-06-10 DIAGNOSIS — I10 Essential (primary) hypertension: Secondary | ICD-10-CM | POA: Diagnosis not present

## 2022-06-10 DIAGNOSIS — I2510 Atherosclerotic heart disease of native coronary artery without angina pectoris: Secondary | ICD-10-CM | POA: Diagnosis not present

## 2022-06-10 DIAGNOSIS — D649 Anemia, unspecified: Secondary | ICD-10-CM | POA: Diagnosis not present

## 2022-06-10 DIAGNOSIS — D849 Immunodeficiency, unspecified: Secondary | ICD-10-CM | POA: Diagnosis not present

## 2022-06-10 DIAGNOSIS — Z940 Kidney transplant status: Secondary | ICD-10-CM | POA: Diagnosis not present

## 2022-06-10 DIAGNOSIS — E785 Hyperlipidemia, unspecified: Secondary | ICD-10-CM | POA: Diagnosis not present

## 2022-06-10 DIAGNOSIS — N183 Chronic kidney disease, stage 3 unspecified: Secondary | ICD-10-CM | POA: Diagnosis not present

## 2022-06-10 DIAGNOSIS — D631 Anemia in chronic kidney disease: Secondary | ICD-10-CM | POA: Diagnosis not present

## 2022-06-10 DIAGNOSIS — N179 Acute kidney failure, unspecified: Secondary | ICD-10-CM | POA: Diagnosis not present

## 2022-06-10 DIAGNOSIS — M329 Systemic lupus erythematosus, unspecified: Secondary | ICD-10-CM | POA: Diagnosis not present

## 2022-06-10 DIAGNOSIS — I480 Paroxysmal atrial fibrillation: Secondary | ICD-10-CM | POA: Diagnosis not present

## 2022-06-11 DIAGNOSIS — I491 Atrial premature depolarization: Secondary | ICD-10-CM | POA: Diagnosis not present

## 2022-06-11 DIAGNOSIS — I4510 Unspecified right bundle-branch block: Secondary | ICD-10-CM | POA: Diagnosis not present

## 2022-06-11 DIAGNOSIS — I480 Paroxysmal atrial fibrillation: Secondary | ICD-10-CM | POA: Diagnosis not present

## 2022-06-11 DIAGNOSIS — Z940 Kidney transplant status: Secondary | ICD-10-CM | POA: Diagnosis not present

## 2022-06-11 DIAGNOSIS — D649 Anemia, unspecified: Secondary | ICD-10-CM | POA: Diagnosis not present

## 2022-06-11 DIAGNOSIS — D631 Anemia in chronic kidney disease: Secondary | ICD-10-CM | POA: Diagnosis not present

## 2022-06-11 DIAGNOSIS — R5381 Other malaise: Secondary | ICD-10-CM | POA: Diagnosis not present

## 2022-06-11 DIAGNOSIS — I129 Hypertensive chronic kidney disease with stage 1 through stage 4 chronic kidney disease, or unspecified chronic kidney disease: Secondary | ICD-10-CM | POA: Diagnosis not present

## 2022-06-11 DIAGNOSIS — I499 Cardiac arrhythmia, unspecified: Secondary | ICD-10-CM | POA: Diagnosis not present

## 2022-06-11 DIAGNOSIS — I2510 Atherosclerotic heart disease of native coronary artery without angina pectoris: Secondary | ICD-10-CM | POA: Diagnosis not present

## 2022-06-11 DIAGNOSIS — I10 Essential (primary) hypertension: Secondary | ICD-10-CM | POA: Diagnosis not present

## 2022-06-11 DIAGNOSIS — N179 Acute kidney failure, unspecified: Secondary | ICD-10-CM | POA: Diagnosis not present

## 2022-06-11 DIAGNOSIS — I493 Ventricular premature depolarization: Secondary | ICD-10-CM | POA: Diagnosis not present

## 2022-06-11 DIAGNOSIS — N183 Chronic kidney disease, stage 3 unspecified: Secondary | ICD-10-CM | POA: Diagnosis not present

## 2022-06-12 DIAGNOSIS — D649 Anemia, unspecified: Secondary | ICD-10-CM | POA: Diagnosis not present

## 2022-06-12 DIAGNOSIS — I2510 Atherosclerotic heart disease of native coronary artery without angina pectoris: Secondary | ICD-10-CM | POA: Diagnosis not present

## 2022-06-12 DIAGNOSIS — N179 Acute kidney failure, unspecified: Secondary | ICD-10-CM | POA: Diagnosis not present

## 2022-06-12 DIAGNOSIS — I10 Essential (primary) hypertension: Secondary | ICD-10-CM | POA: Diagnosis not present

## 2022-06-12 DIAGNOSIS — N183 Chronic kidney disease, stage 3 unspecified: Secondary | ICD-10-CM | POA: Diagnosis not present

## 2022-06-27 DIAGNOSIS — I10 Essential (primary) hypertension: Secondary | ICD-10-CM | POA: Diagnosis not present

## 2022-06-27 DIAGNOSIS — N183 Chronic kidney disease, stage 3 unspecified: Secondary | ICD-10-CM | POA: Diagnosis not present

## 2022-06-27 DIAGNOSIS — R0602 Shortness of breath: Secondary | ICD-10-CM | POA: Diagnosis not present

## 2022-06-27 DIAGNOSIS — Z940 Kidney transplant status: Secondary | ICD-10-CM | POA: Diagnosis not present

## 2022-07-10 DIAGNOSIS — I10 Essential (primary) hypertension: Secondary | ICD-10-CM | POA: Diagnosis not present

## 2022-07-10 DIAGNOSIS — N183 Chronic kidney disease, stage 3 unspecified: Secondary | ICD-10-CM | POA: Diagnosis not present

## 2022-07-10 DIAGNOSIS — Z940 Kidney transplant status: Secondary | ICD-10-CM | POA: Diagnosis not present

## 2022-07-12 DIAGNOSIS — N390 Urinary tract infection, site not specified: Secondary | ICD-10-CM | POA: Diagnosis not present

## 2022-07-12 DIAGNOSIS — Z940 Kidney transplant status: Secondary | ICD-10-CM | POA: Diagnosis not present

## 2022-07-12 DIAGNOSIS — E860 Dehydration: Secondary | ICD-10-CM | POA: Diagnosis not present

## 2022-07-12 DIAGNOSIS — E876 Hypokalemia: Secondary | ICD-10-CM | POA: Diagnosis not present

## 2022-07-12 DIAGNOSIS — N179 Acute kidney failure, unspecified: Secondary | ICD-10-CM | POA: Diagnosis not present

## 2022-07-12 DIAGNOSIS — N183 Chronic kidney disease, stage 3 unspecified: Secondary | ICD-10-CM | POA: Diagnosis not present

## 2022-07-13 DIAGNOSIS — T8610 Unspecified complication of kidney transplant: Secondary | ICD-10-CM | POA: Diagnosis not present

## 2022-07-13 DIAGNOSIS — R7989 Other specified abnormal findings of blood chemistry: Secondary | ICD-10-CM | POA: Diagnosis not present

## 2022-07-13 DIAGNOSIS — Z79899 Other long term (current) drug therapy: Secondary | ICD-10-CM | POA: Diagnosis not present

## 2022-07-13 DIAGNOSIS — I129 Hypertensive chronic kidney disease with stage 1 through stage 4 chronic kidney disease, or unspecified chronic kidney disease: Secondary | ICD-10-CM | POA: Diagnosis not present

## 2022-07-13 DIAGNOSIS — Y830 Surgical operation with transplant of whole organ as the cause of abnormal reaction of the patient, or of later complication, without mention of misadventure at the time of the procedure: Secondary | ICD-10-CM | POA: Diagnosis not present

## 2022-07-13 DIAGNOSIS — M328 Other forms of systemic lupus erythematosus: Secondary | ICD-10-CM | POA: Diagnosis not present

## 2022-07-13 DIAGNOSIS — N179 Acute kidney failure, unspecified: Secondary | ICD-10-CM | POA: Diagnosis not present

## 2022-07-13 DIAGNOSIS — Z96652 Presence of left artificial knee joint: Secondary | ICD-10-CM | POA: Diagnosis not present

## 2022-07-13 DIAGNOSIS — Z79621 Long term (current) use of calcineurin inhibitor: Secondary | ICD-10-CM | POA: Diagnosis not present

## 2022-07-13 DIAGNOSIS — Z7982 Long term (current) use of aspirin: Secondary | ICD-10-CM | POA: Diagnosis not present

## 2022-07-13 DIAGNOSIS — I2510 Atherosclerotic heart disease of native coronary artery without angina pectoris: Secondary | ICD-10-CM | POA: Diagnosis not present

## 2022-07-13 DIAGNOSIS — N183 Chronic kidney disease, stage 3 unspecified: Secondary | ICD-10-CM | POA: Diagnosis not present

## 2022-07-13 DIAGNOSIS — T8619 Other complication of kidney transplant: Secondary | ICD-10-CM | POA: Diagnosis not present

## 2022-07-13 DIAGNOSIS — Z940 Kidney transplant status: Secondary | ICD-10-CM | POA: Diagnosis not present

## 2022-07-13 DIAGNOSIS — I10 Essential (primary) hypertension: Secondary | ICD-10-CM | POA: Diagnosis not present

## 2022-07-13 DIAGNOSIS — E860 Dehydration: Secondary | ICD-10-CM | POA: Diagnosis not present

## 2022-07-14 DIAGNOSIS — I129 Hypertensive chronic kidney disease with stage 1 through stage 4 chronic kidney disease, or unspecified chronic kidney disease: Secondary | ICD-10-CM | POA: Diagnosis not present

## 2022-07-14 DIAGNOSIS — N183 Chronic kidney disease, stage 3 unspecified: Secondary | ICD-10-CM | POA: Diagnosis not present

## 2022-07-14 DIAGNOSIS — D649 Anemia, unspecified: Secondary | ICD-10-CM | POA: Diagnosis not present

## 2022-07-14 DIAGNOSIS — N179 Acute kidney failure, unspecified: Secondary | ICD-10-CM | POA: Diagnosis not present

## 2022-07-14 DIAGNOSIS — Z940 Kidney transplant status: Secondary | ICD-10-CM | POA: Diagnosis not present

## 2022-07-14 DIAGNOSIS — R7989 Other specified abnormal findings of blood chemistry: Secondary | ICD-10-CM | POA: Diagnosis not present

## 2022-07-14 DIAGNOSIS — I10 Essential (primary) hypertension: Secondary | ICD-10-CM | POA: Diagnosis not present

## 2022-07-14 DIAGNOSIS — T8610 Unspecified complication of kidney transplant: Secondary | ICD-10-CM | POA: Diagnosis not present

## 2022-08-07 DIAGNOSIS — N183 Chronic kidney disease, stage 3 unspecified: Secondary | ICD-10-CM | POA: Diagnosis not present

## 2022-08-07 DIAGNOSIS — Z940 Kidney transplant status: Secondary | ICD-10-CM | POA: Diagnosis not present

## 2022-08-07 DIAGNOSIS — R0602 Shortness of breath: Secondary | ICD-10-CM | POA: Diagnosis not present

## 2022-08-07 DIAGNOSIS — I10 Essential (primary) hypertension: Secondary | ICD-10-CM | POA: Diagnosis not present

## 2022-08-31 DIAGNOSIS — I119 Hypertensive heart disease without heart failure: Secondary | ICD-10-CM | POA: Diagnosis not present

## 2022-08-31 DIAGNOSIS — N185 Chronic kidney disease, stage 5: Secondary | ICD-10-CM | POA: Diagnosis not present

## 2022-10-01 IMAGING — DX DG CHEST 1V PORT
1 series · 1 of 1 positions shown · non-contrast
Comparison: October 02, 2018

CLINICAL DATA: Cough for a month.

EXAM:
PORTABLE CHEST 1 VIEW

[chest ap]
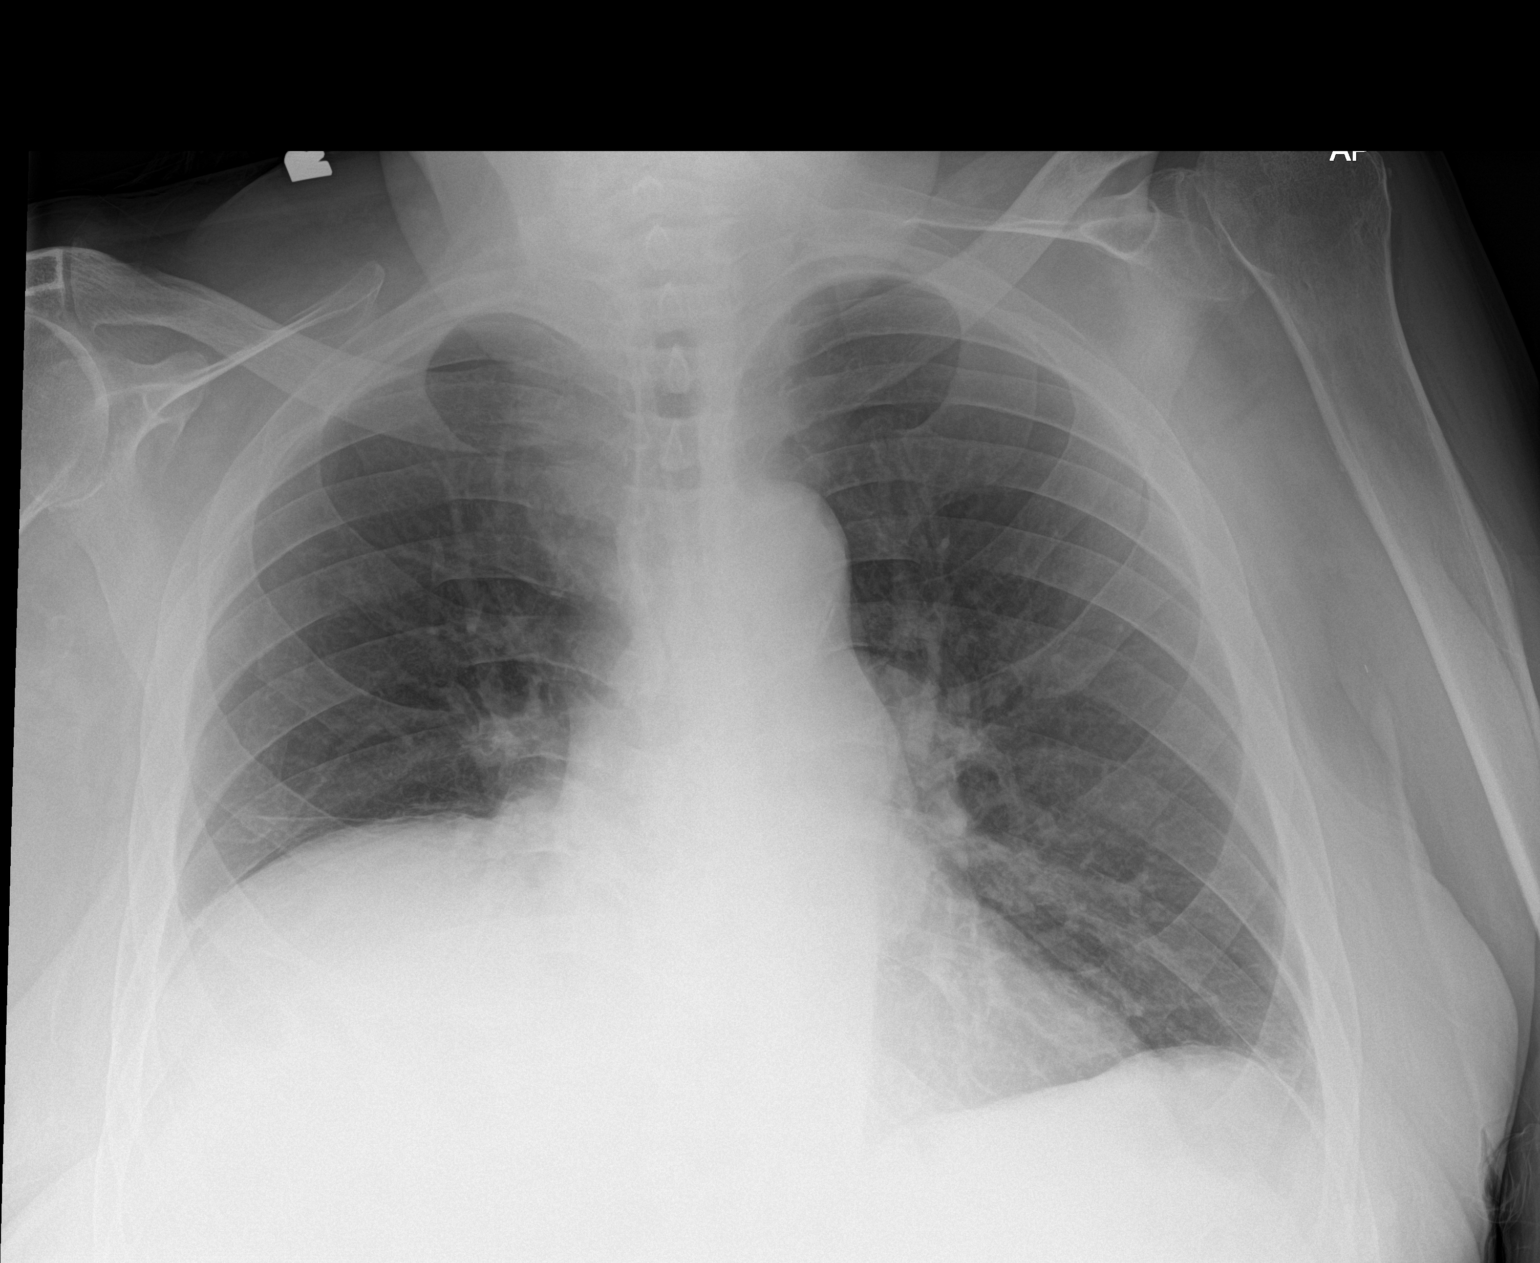

[1 of 1 positions shown; findings below may reference images not displayed]

FINDINGS: Elevation of the right hemidiaphragm is stable. No pneumothorax. The
lungs are clear. The cardiomediastinal silhouette is stable. No
other acute abnormalities.
IMPRESSION: No active disease.

## 2022-10-03 DIAGNOSIS — E7800 Pure hypercholesterolemia, unspecified: Secondary | ICD-10-CM | POA: Diagnosis not present

## 2022-10-03 DIAGNOSIS — E559 Vitamin D deficiency, unspecified: Secondary | ICD-10-CM | POA: Diagnosis not present

## 2022-10-03 DIAGNOSIS — M1712 Unilateral primary osteoarthritis, left knee: Secondary | ICD-10-CM | POA: Diagnosis not present

## 2022-10-03 DIAGNOSIS — Z940 Kidney transplant status: Secondary | ICD-10-CM | POA: Diagnosis not present

## 2022-10-03 DIAGNOSIS — M109 Gout, unspecified: Secondary | ICD-10-CM | POA: Diagnosis not present

## 2022-10-03 DIAGNOSIS — M542 Cervicalgia: Secondary | ICD-10-CM | POA: Diagnosis not present

## 2022-10-03 DIAGNOSIS — R7303 Prediabetes: Secondary | ICD-10-CM | POA: Diagnosis not present

## 2022-10-03 DIAGNOSIS — I10 Essential (primary) hypertension: Secondary | ICD-10-CM | POA: Diagnosis not present

## 2022-10-03 DIAGNOSIS — N183 Chronic kidney disease, stage 3 unspecified: Secondary | ICD-10-CM | POA: Diagnosis not present

## 2022-10-03 DIAGNOSIS — E538 Deficiency of other specified B group vitamins: Secondary | ICD-10-CM | POA: Diagnosis not present

## 2022-10-21 DIAGNOSIS — D638 Anemia in other chronic diseases classified elsewhere: Secondary | ICD-10-CM | POA: Diagnosis not present

## 2022-10-21 DIAGNOSIS — Z79899 Other long term (current) drug therapy: Secondary | ICD-10-CM | POA: Diagnosis not present

## 2022-10-21 DIAGNOSIS — Z96652 Presence of left artificial knee joint: Secondary | ICD-10-CM | POA: Diagnosis not present

## 2022-10-21 DIAGNOSIS — Z940 Kidney transplant status: Secondary | ICD-10-CM | POA: Diagnosis not present

## 2022-10-21 DIAGNOSIS — M109 Gout, unspecified: Secondary | ICD-10-CM | POA: Diagnosis not present

## 2022-10-21 DIAGNOSIS — Z7901 Long term (current) use of anticoagulants: Secondary | ICD-10-CM | POA: Diagnosis not present

## 2022-10-21 DIAGNOSIS — D84821 Immunodeficiency due to drugs: Secondary | ICD-10-CM | POA: Diagnosis not present

## 2022-10-21 DIAGNOSIS — R0689 Other abnormalities of breathing: Secondary | ICD-10-CM | POA: Diagnosis not present

## 2022-10-21 DIAGNOSIS — N189 Chronic kidney disease, unspecified: Secondary | ICD-10-CM | POA: Diagnosis not present

## 2022-10-21 DIAGNOSIS — R627 Adult failure to thrive: Secondary | ICD-10-CM | POA: Diagnosis not present

## 2022-10-21 DIAGNOSIS — I493 Ventricular premature depolarization: Secondary | ICD-10-CM | POA: Diagnosis not present

## 2022-10-21 DIAGNOSIS — I517 Cardiomegaly: Secondary | ICD-10-CM | POA: Diagnosis not present

## 2022-10-21 DIAGNOSIS — I959 Hypotension, unspecified: Secondary | ICD-10-CM | POA: Diagnosis not present

## 2022-10-21 DIAGNOSIS — R000 Tachycardia, unspecified: Secondary | ICD-10-CM | POA: Diagnosis not present

## 2022-10-21 DIAGNOSIS — K219 Gastro-esophageal reflux disease without esophagitis: Secondary | ICD-10-CM | POA: Diagnosis not present

## 2022-10-21 DIAGNOSIS — M329 Systemic lupus erythematosus, unspecified: Secondary | ICD-10-CM | POA: Diagnosis not present

## 2022-10-21 DIAGNOSIS — T8619 Other complication of kidney transplant: Secondary | ICD-10-CM | POA: Diagnosis not present

## 2022-10-21 DIAGNOSIS — I129 Hypertensive chronic kidney disease with stage 1 through stage 4 chronic kidney disease, or unspecified chronic kidney disease: Secondary | ICD-10-CM | POA: Diagnosis not present

## 2022-10-21 DIAGNOSIS — R9389 Abnormal findings on diagnostic imaging of other specified body structures: Secondary | ICD-10-CM | POA: Diagnosis not present

## 2022-10-21 DIAGNOSIS — Z79621 Long term (current) use of calcineurin inhibitor: Secondary | ICD-10-CM | POA: Diagnosis not present

## 2022-10-21 DIAGNOSIS — Z743 Need for continuous supervision: Secondary | ICD-10-CM | POA: Diagnosis not present

## 2022-10-21 DIAGNOSIS — Z7982 Long term (current) use of aspirin: Secondary | ICD-10-CM | POA: Diagnosis not present

## 2022-10-21 DIAGNOSIS — D649 Anemia, unspecified: Secondary | ICD-10-CM | POA: Diagnosis not present

## 2022-10-21 DIAGNOSIS — N183 Chronic kidney disease, stage 3 unspecified: Secondary | ICD-10-CM | POA: Diagnosis not present

## 2022-10-21 DIAGNOSIS — Z8616 Personal history of COVID-19: Secondary | ICD-10-CM | POA: Diagnosis not present

## 2022-10-21 DIAGNOSIS — M4126 Other idiopathic scoliosis, lumbar region: Secondary | ICD-10-CM | POA: Diagnosis not present

## 2022-10-21 DIAGNOSIS — N186 End stage renal disease: Secondary | ICD-10-CM | POA: Diagnosis not present

## 2022-10-21 DIAGNOSIS — I4510 Unspecified right bundle-branch block: Secondary | ICD-10-CM | POA: Diagnosis not present

## 2022-10-21 DIAGNOSIS — I491 Atrial premature depolarization: Secondary | ICD-10-CM | POA: Diagnosis not present

## 2022-10-21 DIAGNOSIS — R944 Abnormal results of kidney function studies: Secondary | ICD-10-CM | POA: Diagnosis not present

## 2022-10-21 DIAGNOSIS — I739 Peripheral vascular disease, unspecified: Secondary | ICD-10-CM | POA: Diagnosis not present

## 2022-10-21 DIAGNOSIS — E872 Acidosis, unspecified: Secondary | ICD-10-CM | POA: Diagnosis not present

## 2022-10-21 DIAGNOSIS — R531 Weakness: Secondary | ICD-10-CM | POA: Diagnosis not present

## 2022-10-21 DIAGNOSIS — Z66 Do not resuscitate: Secondary | ICD-10-CM | POA: Diagnosis not present

## 2022-10-21 DIAGNOSIS — N179 Acute kidney failure, unspecified: Secondary | ICD-10-CM | POA: Diagnosis not present

## 2022-10-21 DIAGNOSIS — E8721 Acute metabolic acidosis: Secondary | ICD-10-CM | POA: Diagnosis not present

## 2022-10-21 DIAGNOSIS — Y830 Surgical operation with transplant of whole organ as the cause of abnormal reaction of the patient, or of later complication, without mention of misadventure at the time of the procedure: Secondary | ICD-10-CM | POA: Diagnosis not present

## 2022-10-21 DIAGNOSIS — Z9049 Acquired absence of other specified parts of digestive tract: Secondary | ICD-10-CM | POA: Diagnosis not present

## 2022-10-21 DIAGNOSIS — I4891 Unspecified atrial fibrillation: Secondary | ICD-10-CM | POA: Diagnosis not present

## 2022-10-21 DIAGNOSIS — E785 Hyperlipidemia, unspecified: Secondary | ICD-10-CM | POA: Diagnosis not present

## 2022-10-21 DIAGNOSIS — I10 Essential (primary) hypertension: Secondary | ICD-10-CM | POA: Diagnosis not present

## 2022-10-21 DIAGNOSIS — R6889 Other general symptoms and signs: Secondary | ICD-10-CM | POA: Diagnosis not present

## 2022-10-22 DIAGNOSIS — N179 Acute kidney failure, unspecified: Secondary | ICD-10-CM | POA: Diagnosis not present

## 2022-10-22 DIAGNOSIS — I491 Atrial premature depolarization: Secondary | ICD-10-CM | POA: Diagnosis not present

## 2022-10-22 DIAGNOSIS — R6889 Other general symptoms and signs: Secondary | ICD-10-CM | POA: Diagnosis not present

## 2022-10-22 DIAGNOSIS — I10 Essential (primary) hypertension: Secondary | ICD-10-CM | POA: Diagnosis not present

## 2022-10-22 DIAGNOSIS — I4510 Unspecified right bundle-branch block: Secondary | ICD-10-CM | POA: Diagnosis not present

## 2022-10-22 DIAGNOSIS — N189 Chronic kidney disease, unspecified: Secondary | ICD-10-CM | POA: Diagnosis not present

## 2022-10-22 DIAGNOSIS — D649 Anemia, unspecified: Secondary | ICD-10-CM | POA: Diagnosis not present

## 2022-10-22 DIAGNOSIS — I493 Ventricular premature depolarization: Secondary | ICD-10-CM | POA: Diagnosis not present

## 2022-10-23 DIAGNOSIS — N189 Chronic kidney disease, unspecified: Secondary | ICD-10-CM | POA: Diagnosis not present

## 2022-10-23 DIAGNOSIS — I10 Essential (primary) hypertension: Secondary | ICD-10-CM | POA: Diagnosis not present

## 2022-10-23 DIAGNOSIS — D649 Anemia, unspecified: Secondary | ICD-10-CM | POA: Diagnosis not present

## 2022-10-23 DIAGNOSIS — N179 Acute kidney failure, unspecified: Secondary | ICD-10-CM | POA: Diagnosis not present

## 2022-10-23 DIAGNOSIS — I129 Hypertensive chronic kidney disease with stage 1 through stage 4 chronic kidney disease, or unspecified chronic kidney disease: Secondary | ICD-10-CM | POA: Diagnosis not present

## 2022-10-23 DIAGNOSIS — R6889 Other general symptoms and signs: Secondary | ICD-10-CM | POA: Diagnosis not present

## 2022-10-24 DIAGNOSIS — N189 Chronic kidney disease, unspecified: Secondary | ICD-10-CM | POA: Diagnosis not present

## 2022-10-24 DIAGNOSIS — D649 Anemia, unspecified: Secondary | ICD-10-CM | POA: Diagnosis not present

## 2022-10-24 DIAGNOSIS — R6889 Other general symptoms and signs: Secondary | ICD-10-CM | POA: Diagnosis not present

## 2022-10-24 DIAGNOSIS — N179 Acute kidney failure, unspecified: Secondary | ICD-10-CM | POA: Diagnosis not present

## 2022-10-24 DIAGNOSIS — I129 Hypertensive chronic kidney disease with stage 1 through stage 4 chronic kidney disease, or unspecified chronic kidney disease: Secondary | ICD-10-CM | POA: Diagnosis not present

## 2022-10-24 DIAGNOSIS — I10 Essential (primary) hypertension: Secondary | ICD-10-CM | POA: Diagnosis not present

## 2022-10-25 DIAGNOSIS — N179 Acute kidney failure, unspecified: Secondary | ICD-10-CM | POA: Diagnosis not present

## 2022-10-25 DIAGNOSIS — D649 Anemia, unspecified: Secondary | ICD-10-CM | POA: Diagnosis not present

## 2022-10-25 DIAGNOSIS — R6889 Other general symptoms and signs: Secondary | ICD-10-CM | POA: Diagnosis not present

## 2022-10-25 DIAGNOSIS — N189 Chronic kidney disease, unspecified: Secondary | ICD-10-CM | POA: Diagnosis not present

## 2022-10-25 DIAGNOSIS — I10 Essential (primary) hypertension: Secondary | ICD-10-CM | POA: Diagnosis not present

## 2022-10-26 DIAGNOSIS — I10 Essential (primary) hypertension: Secondary | ICD-10-CM | POA: Diagnosis not present

## 2022-10-26 DIAGNOSIS — R6889 Other general symptoms and signs: Secondary | ICD-10-CM | POA: Diagnosis not present

## 2022-10-26 DIAGNOSIS — N189 Chronic kidney disease, unspecified: Secondary | ICD-10-CM | POA: Diagnosis not present

## 2022-10-26 DIAGNOSIS — D649 Anemia, unspecified: Secondary | ICD-10-CM | POA: Diagnosis not present

## 2022-10-26 DIAGNOSIS — N179 Acute kidney failure, unspecified: Secondary | ICD-10-CM | POA: Diagnosis not present

## 2022-10-27 DIAGNOSIS — N179 Acute kidney failure, unspecified: Secondary | ICD-10-CM | POA: Diagnosis not present

## 2022-10-27 DIAGNOSIS — I10 Essential (primary) hypertension: Secondary | ICD-10-CM | POA: Diagnosis not present

## 2022-10-27 DIAGNOSIS — N189 Chronic kidney disease, unspecified: Secondary | ICD-10-CM | POA: Diagnosis not present

## 2022-10-27 DIAGNOSIS — D649 Anemia, unspecified: Secondary | ICD-10-CM | POA: Diagnosis not present

## 2022-10-28 DIAGNOSIS — D649 Anemia, unspecified: Secondary | ICD-10-CM | POA: Diagnosis not present

## 2022-10-28 DIAGNOSIS — I10 Essential (primary) hypertension: Secondary | ICD-10-CM | POA: Diagnosis not present

## 2022-10-28 DIAGNOSIS — N179 Acute kidney failure, unspecified: Secondary | ICD-10-CM | POA: Diagnosis not present

## 2022-10-28 DIAGNOSIS — N189 Chronic kidney disease, unspecified: Secondary | ICD-10-CM | POA: Diagnosis not present

## 2022-10-29 DIAGNOSIS — R6889 Other general symptoms and signs: Secondary | ICD-10-CM | POA: Diagnosis not present

## 2022-10-29 DIAGNOSIS — N189 Chronic kidney disease, unspecified: Secondary | ICD-10-CM | POA: Diagnosis not present

## 2022-10-29 DIAGNOSIS — R531 Weakness: Secondary | ICD-10-CM | POA: Diagnosis not present

## 2022-10-29 DIAGNOSIS — D649 Anemia, unspecified: Secondary | ICD-10-CM | POA: Diagnosis not present

## 2022-10-29 DIAGNOSIS — Z743 Need for continuous supervision: Secondary | ICD-10-CM | POA: Diagnosis not present

## 2022-10-29 DIAGNOSIS — I10 Essential (primary) hypertension: Secondary | ICD-10-CM | POA: Diagnosis not present

## 2022-10-29 DIAGNOSIS — N179 Acute kidney failure, unspecified: Secondary | ICD-10-CM | POA: Diagnosis not present

## 2022-10-29 DIAGNOSIS — R944 Abnormal results of kidney function studies: Secondary | ICD-10-CM | POA: Diagnosis not present

## 2022-10-30 DIAGNOSIS — Y830 Surgical operation with transplant of whole organ as the cause of abnormal reaction of the patient, or of later complication, without mention of misadventure at the time of the procedure: Secondary | ICD-10-CM | POA: Diagnosis not present

## 2022-10-30 DIAGNOSIS — A6001 Herpesviral infection of penis: Secondary | ICD-10-CM | POA: Diagnosis not present

## 2022-10-30 DIAGNOSIS — B009 Herpesviral infection, unspecified: Secondary | ICD-10-CM | POA: Diagnosis not present

## 2022-10-30 DIAGNOSIS — E872 Acidosis, unspecified: Secondary | ICD-10-CM | POA: Diagnosis not present

## 2022-10-30 DIAGNOSIS — L98491 Non-pressure chronic ulcer of skin of other sites limited to breakdown of skin: Secondary | ICD-10-CM | POA: Diagnosis not present

## 2022-10-30 DIAGNOSIS — T8619 Other complication of kidney transplant: Secondary | ICD-10-CM | POA: Diagnosis not present

## 2022-10-30 DIAGNOSIS — N179 Acute kidney failure, unspecified: Secondary | ICD-10-CM | POA: Diagnosis not present

## 2022-10-30 DIAGNOSIS — I120 Hypertensive chronic kidney disease with stage 5 chronic kidney disease or end stage renal disease: Secondary | ICD-10-CM | POA: Diagnosis not present

## 2022-10-30 DIAGNOSIS — D509 Iron deficiency anemia, unspecified: Secondary | ICD-10-CM | POA: Diagnosis not present

## 2022-10-30 DIAGNOSIS — E785 Hyperlipidemia, unspecified: Secondary | ICD-10-CM | POA: Diagnosis not present

## 2022-10-30 DIAGNOSIS — Z79899 Other long term (current) drug therapy: Secondary | ICD-10-CM | POA: Diagnosis not present

## 2022-10-30 DIAGNOSIS — D84821 Immunodeficiency due to drugs: Secondary | ICD-10-CM | POA: Diagnosis not present

## 2022-10-30 DIAGNOSIS — Z79621 Long term (current) use of calcineurin inhibitor: Secondary | ICD-10-CM | POA: Diagnosis not present

## 2022-10-30 DIAGNOSIS — K219 Gastro-esophageal reflux disease without esophagitis: Secondary | ICD-10-CM | POA: Diagnosis not present

## 2022-10-30 DIAGNOSIS — R531 Weakness: Secondary | ICD-10-CM | POA: Diagnosis not present

## 2022-10-30 DIAGNOSIS — M109 Gout, unspecified: Secondary | ICD-10-CM | POA: Diagnosis not present

## 2022-10-30 DIAGNOSIS — M329 Systemic lupus erythematosus, unspecified: Secondary | ICD-10-CM | POA: Diagnosis not present

## 2022-10-30 DIAGNOSIS — Z7969 Long term (current) use of other immunomodulators and immunosuppressants: Secondary | ICD-10-CM | POA: Diagnosis not present

## 2022-10-30 DIAGNOSIS — E440 Moderate protein-calorie malnutrition: Secondary | ICD-10-CM | POA: Diagnosis not present

## 2022-10-30 DIAGNOSIS — E875 Hyperkalemia: Secondary | ICD-10-CM | POA: Diagnosis not present

## 2022-10-30 DIAGNOSIS — I080 Rheumatic disorders of both mitral and aortic valves: Secondary | ICD-10-CM | POA: Diagnosis not present

## 2022-10-30 DIAGNOSIS — E871 Hypo-osmolality and hyponatremia: Secondary | ICD-10-CM | POA: Diagnosis not present

## 2022-10-30 DIAGNOSIS — Z7982 Long term (current) use of aspirin: Secondary | ICD-10-CM | POA: Diagnosis not present

## 2022-10-30 DIAGNOSIS — I4891 Unspecified atrial fibrillation: Secondary | ICD-10-CM | POA: Diagnosis not present

## 2022-10-30 DIAGNOSIS — I350 Nonrheumatic aortic (valve) stenosis: Secondary | ICD-10-CM | POA: Diagnosis not present

## 2022-10-30 DIAGNOSIS — M11262 Other chondrocalcinosis, left knee: Secondary | ICD-10-CM | POA: Diagnosis not present

## 2022-10-30 DIAGNOSIS — N485 Ulcer of penis: Secondary | ICD-10-CM | POA: Diagnosis not present

## 2022-10-30 DIAGNOSIS — Z6830 Body mass index (BMI) 30.0-30.9, adult: Secondary | ICD-10-CM | POA: Diagnosis not present

## 2022-10-30 DIAGNOSIS — I272 Pulmonary hypertension, unspecified: Secondary | ICD-10-CM | POA: Diagnosis not present

## 2022-10-30 DIAGNOSIS — E669 Obesity, unspecified: Secondary | ICD-10-CM | POA: Diagnosis not present

## 2022-10-30 DIAGNOSIS — Z940 Kidney transplant status: Secondary | ICD-10-CM | POA: Diagnosis not present

## 2022-10-30 DIAGNOSIS — N189 Chronic kidney disease, unspecified: Secondary | ICD-10-CM | POA: Diagnosis not present

## 2022-11-02 DIAGNOSIS — B009 Herpesviral infection, unspecified: Secondary | ICD-10-CM | POA: Diagnosis not present

## 2022-11-02 DIAGNOSIS — Z743 Need for continuous supervision: Secondary | ICD-10-CM | POA: Diagnosis not present

## 2022-11-02 DIAGNOSIS — N179 Acute kidney failure, unspecified: Secondary | ICD-10-CM | POA: Diagnosis not present

## 2022-11-02 DIAGNOSIS — E871 Hypo-osmolality and hyponatremia: Secondary | ICD-10-CM | POA: Diagnosis not present

## 2022-11-02 DIAGNOSIS — N189 Chronic kidney disease, unspecified: Secondary | ICD-10-CM | POA: Diagnosis not present

## 2022-11-02 DIAGNOSIS — R69 Illness, unspecified: Secondary | ICD-10-CM | POA: Diagnosis not present

## 2022-11-07 DIAGNOSIS — S31103D Unspecified open wound of abdominal wall, right lower quadrant without penetration into peritoneal cavity, subsequent encounter: Secondary | ICD-10-CM | POA: Diagnosis not present

## 2022-11-07 DIAGNOSIS — R2681 Unsteadiness on feet: Secondary | ICD-10-CM | POA: Diagnosis not present

## 2022-11-07 DIAGNOSIS — N179 Acute kidney failure, unspecified: Secondary | ICD-10-CM | POA: Diagnosis not present

## 2022-11-07 DIAGNOSIS — R531 Weakness: Secondary | ICD-10-CM | POA: Diagnosis not present

## 2022-11-07 DIAGNOSIS — S31104D Unspecified open wound of abdominal wall, left lower quadrant without penetration into peritoneal cavity, subsequent encounter: Secondary | ICD-10-CM | POA: Diagnosis not present

## 2022-11-07 DIAGNOSIS — L89626 Pressure-induced deep tissue damage of left heel: Secondary | ICD-10-CM | POA: Diagnosis not present

## 2022-11-07 DIAGNOSIS — R21 Rash and other nonspecific skin eruption: Secondary | ICD-10-CM | POA: Diagnosis not present

## 2022-11-09 DIAGNOSIS — N189 Chronic kidney disease, unspecified: Secondary | ICD-10-CM | POA: Diagnosis not present

## 2022-11-09 DIAGNOSIS — A609 Anogenital herpesviral infection, unspecified: Secondary | ICD-10-CM | POA: Diagnosis not present

## 2022-11-09 DIAGNOSIS — T8611 Kidney transplant rejection: Secondary | ICD-10-CM | POA: Diagnosis not present

## 2022-11-09 DIAGNOSIS — E46 Unspecified protein-calorie malnutrition: Secondary | ICD-10-CM | POA: Diagnosis not present

## 2022-11-12 DIAGNOSIS — M109 Gout, unspecified: Secondary | ICD-10-CM | POA: Diagnosis not present

## 2022-11-12 DIAGNOSIS — N189 Chronic kidney disease, unspecified: Secondary | ICD-10-CM | POA: Diagnosis not present

## 2022-11-12 DIAGNOSIS — I10 Essential (primary) hypertension: Secondary | ICD-10-CM | POA: Diagnosis not present

## 2022-11-12 DIAGNOSIS — I4891 Unspecified atrial fibrillation: Secondary | ICD-10-CM | POA: Diagnosis not present

## 2022-11-12 DIAGNOSIS — E785 Hyperlipidemia, unspecified: Secondary | ICD-10-CM | POA: Diagnosis not present

## 2022-11-12 DIAGNOSIS — E611 Iron deficiency: Secondary | ICD-10-CM | POA: Diagnosis not present

## 2022-11-12 DIAGNOSIS — T8611 Kidney transplant rejection: Secondary | ICD-10-CM | POA: Diagnosis not present

## 2022-11-12 DIAGNOSIS — R2681 Unsteadiness on feet: Secondary | ICD-10-CM | POA: Diagnosis not present

## 2022-11-12 DIAGNOSIS — I350 Nonrheumatic aortic (valve) stenosis: Secondary | ICD-10-CM | POA: Diagnosis not present

## 2022-11-12 DIAGNOSIS — E46 Unspecified protein-calorie malnutrition: Secondary | ICD-10-CM | POA: Diagnosis not present

## 2022-11-12 DIAGNOSIS — A609 Anogenital herpesviral infection, unspecified: Secondary | ICD-10-CM | POA: Diagnosis not present

## 2022-11-12 DIAGNOSIS — K219 Gastro-esophageal reflux disease without esophagitis: Secondary | ICD-10-CM | POA: Diagnosis not present

## 2022-11-12 DIAGNOSIS — R531 Weakness: Secondary | ICD-10-CM | POA: Diagnosis not present

## 2022-11-12 DIAGNOSIS — N179 Acute kidney failure, unspecified: Secondary | ICD-10-CM | POA: Diagnosis not present

## 2022-11-30 DIAGNOSIS — I70249 Atherosclerosis of native arteries of left leg with ulceration of unspecified site: Secondary | ICD-10-CM | POA: Diagnosis not present

## 2022-11-30 DIAGNOSIS — N184 Chronic kidney disease, stage 4 (severe): Secondary | ICD-10-CM | POA: Diagnosis not present

## 2022-11-30 DIAGNOSIS — D84821 Immunodeficiency due to drugs: Secondary | ICD-10-CM | POA: Diagnosis not present

## 2022-11-30 DIAGNOSIS — M869 Osteomyelitis, unspecified: Secondary | ICD-10-CM | POA: Diagnosis not present

## 2022-11-30 DIAGNOSIS — J984 Other disorders of lung: Secondary | ICD-10-CM | POA: Diagnosis not present

## 2022-11-30 DIAGNOSIS — I70244 Atherosclerosis of native arteries of left leg with ulceration of heel and midfoot: Secondary | ICD-10-CM | POA: Diagnosis not present

## 2022-11-30 DIAGNOSIS — N189 Chronic kidney disease, unspecified: Secondary | ICD-10-CM | POA: Diagnosis not present

## 2022-11-30 DIAGNOSIS — D509 Iron deficiency anemia, unspecified: Secondary | ICD-10-CM | POA: Diagnosis not present

## 2022-11-30 DIAGNOSIS — I480 Paroxysmal atrial fibrillation: Secondary | ICD-10-CM | POA: Diagnosis not present

## 2022-11-30 DIAGNOSIS — I491 Atrial premature depolarization: Secondary | ICD-10-CM | POA: Diagnosis not present

## 2022-11-30 DIAGNOSIS — K5730 Diverticulosis of large intestine without perforation or abscess without bleeding: Secondary | ICD-10-CM | POA: Diagnosis not present

## 2022-11-30 DIAGNOSIS — E11621 Type 2 diabetes mellitus with foot ulcer: Secondary | ICD-10-CM | POA: Diagnosis not present

## 2022-11-30 DIAGNOSIS — N186 End stage renal disease: Secondary | ICD-10-CM | POA: Diagnosis not present

## 2022-11-30 DIAGNOSIS — I739 Peripheral vascular disease, unspecified: Secondary | ICD-10-CM | POA: Diagnosis not present

## 2022-11-30 DIAGNOSIS — J9811 Atelectasis: Secondary | ICD-10-CM | POA: Diagnosis not present

## 2022-11-30 DIAGNOSIS — L089 Local infection of the skin and subcutaneous tissue, unspecified: Secondary | ICD-10-CM | POA: Diagnosis not present

## 2022-11-30 DIAGNOSIS — Z89612 Acquired absence of left leg above knee: Secondary | ICD-10-CM | POA: Diagnosis not present

## 2022-11-30 DIAGNOSIS — R0902 Hypoxemia: Secondary | ICD-10-CM | POA: Diagnosis not present

## 2022-11-30 DIAGNOSIS — N179 Acute kidney failure, unspecified: Secondary | ICD-10-CM | POA: Diagnosis not present

## 2022-11-30 DIAGNOSIS — B952 Enterococcus as the cause of diseases classified elsewhere: Secondary | ICD-10-CM | POA: Diagnosis not present

## 2022-11-30 DIAGNOSIS — E1151 Type 2 diabetes mellitus with diabetic peripheral angiopathy without gangrene: Secondary | ICD-10-CM | POA: Diagnosis not present

## 2022-11-30 DIAGNOSIS — G928 Other toxic encephalopathy: Secondary | ICD-10-CM | POA: Diagnosis not present

## 2022-11-30 DIAGNOSIS — J449 Chronic obstructive pulmonary disease, unspecified: Secondary | ICD-10-CM | POA: Diagnosis not present

## 2022-11-30 DIAGNOSIS — N1330 Unspecified hydronephrosis: Secondary | ICD-10-CM | POA: Diagnosis not present

## 2022-11-30 DIAGNOSIS — E11628 Type 2 diabetes mellitus with other skin complications: Secondary | ICD-10-CM | POA: Diagnosis not present

## 2022-11-30 DIAGNOSIS — Z940 Kidney transplant status: Secondary | ICD-10-CM | POA: Diagnosis not present

## 2022-11-30 DIAGNOSIS — I4510 Unspecified right bundle-branch block: Secondary | ICD-10-CM | POA: Diagnosis not present

## 2022-11-30 DIAGNOSIS — M7989 Other specified soft tissue disorders: Secondary | ICD-10-CM | POA: Diagnosis not present

## 2022-11-30 DIAGNOSIS — Z66 Do not resuscitate: Secondary | ICD-10-CM | POA: Diagnosis not present

## 2022-11-30 DIAGNOSIS — E1122 Type 2 diabetes mellitus with diabetic chronic kidney disease: Secondary | ICD-10-CM | POA: Diagnosis not present

## 2022-11-30 DIAGNOSIS — D649 Anemia, unspecified: Secondary | ICD-10-CM | POA: Diagnosis not present

## 2022-11-30 DIAGNOSIS — M86152 Other acute osteomyelitis, left femur: Secondary | ICD-10-CM | POA: Diagnosis not present

## 2022-11-30 DIAGNOSIS — R509 Fever, unspecified: Secondary | ICD-10-CM | POA: Diagnosis not present

## 2022-11-30 DIAGNOSIS — I96 Gangrene, not elsewhere classified: Secondary | ICD-10-CM | POA: Diagnosis not present

## 2022-11-30 DIAGNOSIS — R0989 Other specified symptoms and signs involving the circulatory and respiratory systems: Secondary | ICD-10-CM | POA: Diagnosis not present

## 2022-11-30 DIAGNOSIS — R000 Tachycardia, unspecified: Secondary | ICD-10-CM | POA: Diagnosis not present

## 2022-11-30 DIAGNOSIS — T8619 Other complication of kidney transplant: Secondary | ICD-10-CM | POA: Diagnosis not present

## 2022-11-30 DIAGNOSIS — I129 Hypertensive chronic kidney disease with stage 1 through stage 4 chronic kidney disease, or unspecified chronic kidney disease: Secondary | ICD-10-CM | POA: Diagnosis not present

## 2022-11-30 DIAGNOSIS — D631 Anemia in chronic kidney disease: Secondary | ICD-10-CM | POA: Diagnosis not present

## 2022-11-30 DIAGNOSIS — I70292 Other atherosclerosis of native arteries of extremities, left leg: Secondary | ICD-10-CM | POA: Diagnosis not present

## 2022-11-30 DIAGNOSIS — I120 Hypertensive chronic kidney disease with stage 5 chronic kidney disease or end stage renal disease: Secondary | ICD-10-CM | POA: Diagnosis not present

## 2022-11-30 DIAGNOSIS — I998 Other disorder of circulatory system: Secondary | ICD-10-CM | POA: Diagnosis not present

## 2022-11-30 DIAGNOSIS — I10 Essential (primary) hypertension: Secondary | ICD-10-CM | POA: Diagnosis not present

## 2022-11-30 DIAGNOSIS — S91302A Unspecified open wound, left foot, initial encounter: Secondary | ICD-10-CM | POA: Diagnosis not present

## 2022-11-30 DIAGNOSIS — N281 Cyst of kidney, acquired: Secondary | ICD-10-CM | POA: Diagnosis not present

## 2022-11-30 DIAGNOSIS — J90 Pleural effusion, not elsewhere classified: Secondary | ICD-10-CM | POA: Diagnosis not present

## 2022-11-30 DIAGNOSIS — I700 Atherosclerosis of aorta: Secondary | ICD-10-CM | POA: Diagnosis not present

## 2022-11-30 DIAGNOSIS — Z1621 Resistance to vancomycin: Secondary | ICD-10-CM | POA: Diagnosis not present

## 2022-11-30 DIAGNOSIS — L03116 Cellulitis of left lower limb: Secondary | ICD-10-CM | POA: Diagnosis not present

## 2022-11-30 DIAGNOSIS — Y830 Surgical operation with transplant of whole organ as the cause of abnormal reaction of the patient, or of later complication, without mention of misadventure at the time of the procedure: Secondary | ICD-10-CM | POA: Diagnosis not present

## 2022-11-30 DIAGNOSIS — R600 Localized edema: Secondary | ICD-10-CM | POA: Diagnosis not present

## 2022-11-30 DIAGNOSIS — I959 Hypotension, unspecified: Secondary | ICD-10-CM | POA: Diagnosis not present

## 2022-11-30 DIAGNOSIS — R531 Weakness: Secondary | ICD-10-CM | POA: Diagnosis not present

## 2022-11-30 DIAGNOSIS — R7989 Other specified abnormal findings of blood chemistry: Secondary | ICD-10-CM | POA: Diagnosis not present

## 2022-11-30 DIAGNOSIS — R9389 Abnormal findings on diagnostic imaging of other specified body structures: Secondary | ICD-10-CM | POA: Diagnosis not present

## 2022-11-30 DIAGNOSIS — I2510 Atherosclerotic heart disease of native coronary artery without angina pectoris: Secondary | ICD-10-CM | POA: Diagnosis not present

## 2022-11-30 DIAGNOSIS — L97929 Non-pressure chronic ulcer of unspecified part of left lower leg with unspecified severity: Secondary | ICD-10-CM | POA: Diagnosis not present

## 2022-11-30 DIAGNOSIS — R0602 Shortness of breath: Secondary | ICD-10-CM | POA: Diagnosis not present

## 2022-11-30 DIAGNOSIS — D696 Thrombocytopenia, unspecified: Secondary | ICD-10-CM | POA: Diagnosis not present

## 2022-11-30 DIAGNOSIS — L02612 Cutaneous abscess of left foot: Secondary | ICD-10-CM | POA: Diagnosis not present

## 2022-11-30 DIAGNOSIS — M2042 Other hammer toe(s) (acquired), left foot: Secondary | ICD-10-CM | POA: Diagnosis not present

## 2022-11-30 DIAGNOSIS — E871 Hypo-osmolality and hyponatremia: Secondary | ICD-10-CM | POA: Diagnosis not present

## 2022-11-30 DIAGNOSIS — L97429 Non-pressure chronic ulcer of left heel and midfoot with unspecified severity: Secondary | ICD-10-CM | POA: Diagnosis not present

## 2022-12-21 DIAGNOSIS — R410 Disorientation, unspecified: Secondary | ICD-10-CM | POA: Diagnosis not present

## 2022-12-21 DIAGNOSIS — S91302A Unspecified open wound, left foot, initial encounter: Secondary | ICD-10-CM | POA: Diagnosis not present

## 2022-12-21 DIAGNOSIS — Z743 Need for continuous supervision: Secondary | ICD-10-CM | POA: Diagnosis not present

## 2022-12-24 DIAGNOSIS — N179 Acute kidney failure, unspecified: Secondary | ICD-10-CM | POA: Diagnosis not present

## 2022-12-24 DIAGNOSIS — T8611 Kidney transplant rejection: Secondary | ICD-10-CM | POA: Diagnosis not present

## 2022-12-24 DIAGNOSIS — Z89612 Acquired absence of left leg above knee: Secondary | ICD-10-CM | POA: Diagnosis not present

## 2022-12-24 DIAGNOSIS — R2681 Unsteadiness on feet: Secondary | ICD-10-CM | POA: Diagnosis not present

## 2022-12-24 DIAGNOSIS — R531 Weakness: Secondary | ICD-10-CM | POA: Diagnosis not present

## 2022-12-24 DIAGNOSIS — Z1621 Resistance to vancomycin: Secondary | ICD-10-CM | POA: Diagnosis not present

## 2022-12-24 DIAGNOSIS — N185 Chronic kidney disease, stage 5: Secondary | ICD-10-CM | POA: Diagnosis not present

## 2022-12-26 DIAGNOSIS — Z89612 Acquired absence of left leg above knee: Secondary | ICD-10-CM | POA: Diagnosis not present

## 2022-12-26 DIAGNOSIS — R531 Weakness: Secondary | ICD-10-CM | POA: Diagnosis not present

## 2022-12-26 DIAGNOSIS — R0989 Other specified symptoms and signs involving the circulatory and respiratory systems: Secondary | ICD-10-CM | POA: Diagnosis not present

## 2022-12-26 DIAGNOSIS — S3130XD Unspecified open wound of scrotum and testes, subsequent encounter: Secondary | ICD-10-CM | POA: Diagnosis not present

## 2022-12-26 DIAGNOSIS — R9389 Abnormal findings on diagnostic imaging of other specified body structures: Secondary | ICD-10-CM | POA: Diagnosis not present

## 2022-12-26 DIAGNOSIS — R21 Rash and other nonspecific skin eruption: Secondary | ICD-10-CM | POA: Diagnosis not present

## 2022-12-26 DIAGNOSIS — R0602 Shortness of breath: Secondary | ICD-10-CM | POA: Diagnosis not present

## 2022-12-26 DIAGNOSIS — Z4800 Encounter for change or removal of nonsurgical wound dressing: Secondary | ICD-10-CM | POA: Diagnosis not present

## 2022-12-26 DIAGNOSIS — Z743 Need for continuous supervision: Secondary | ICD-10-CM | POA: Diagnosis not present

## 2022-12-26 DIAGNOSIS — R6889 Other general symptoms and signs: Secondary | ICD-10-CM | POA: Diagnosis not present

## 2022-12-26 DIAGNOSIS — U071 COVID-19: Secondary | ICD-10-CM | POA: Diagnosis not present

## 2022-12-26 DIAGNOSIS — N179 Acute kidney failure, unspecified: Secondary | ICD-10-CM | POA: Diagnosis not present

## 2022-12-26 DIAGNOSIS — Z20822 Contact with and (suspected) exposure to covid-19: Secondary | ICD-10-CM | POA: Diagnosis not present

## 2022-12-26 DIAGNOSIS — R2681 Unsteadiness on feet: Secondary | ICD-10-CM | POA: Diagnosis not present

## 2022-12-27 DIAGNOSIS — J984 Other disorders of lung: Secondary | ICD-10-CM | POA: Diagnosis not present

## 2022-12-27 DIAGNOSIS — R059 Cough, unspecified: Secondary | ICD-10-CM | POA: Diagnosis not present

## 2022-12-27 DIAGNOSIS — Z7401 Bed confinement status: Secondary | ICD-10-CM | POA: Diagnosis not present

## 2022-12-28 DIAGNOSIS — J449 Chronic obstructive pulmonary disease, unspecified: Secondary | ICD-10-CM | POA: Diagnosis not present

## 2022-12-28 DIAGNOSIS — U071 COVID-19: Secondary | ICD-10-CM | POA: Diagnosis not present

## 2022-12-29 DIAGNOSIS — J189 Pneumonia, unspecified organism: Secondary | ICD-10-CM | POA: Diagnosis not present

## 2022-12-29 DIAGNOSIS — U071 COVID-19: Secondary | ICD-10-CM | POA: Diagnosis not present

## 2022-12-31 DIAGNOSIS — R2681 Unsteadiness on feet: Secondary | ICD-10-CM | POA: Diagnosis not present

## 2022-12-31 DIAGNOSIS — N179 Acute kidney failure, unspecified: Secondary | ICD-10-CM | POA: Diagnosis not present

## 2022-12-31 DIAGNOSIS — R531 Weakness: Secondary | ICD-10-CM | POA: Diagnosis not present

## 2023-01-02 DIAGNOSIS — R531 Weakness: Secondary | ICD-10-CM | POA: Diagnosis not present

## 2023-01-02 DIAGNOSIS — R2681 Unsteadiness on feet: Secondary | ICD-10-CM | POA: Diagnosis not present

## 2023-01-02 DIAGNOSIS — R21 Rash and other nonspecific skin eruption: Secondary | ICD-10-CM | POA: Diagnosis not present

## 2023-01-02 DIAGNOSIS — N179 Acute kidney failure, unspecified: Secondary | ICD-10-CM | POA: Diagnosis not present

## 2023-01-07 DIAGNOSIS — R2681 Unsteadiness on feet: Secondary | ICD-10-CM | POA: Diagnosis not present

## 2023-01-07 DIAGNOSIS — R531 Weakness: Secondary | ICD-10-CM | POA: Diagnosis not present

## 2023-01-07 DIAGNOSIS — N179 Acute kidney failure, unspecified: Secondary | ICD-10-CM | POA: Diagnosis not present

## 2023-01-08 DIAGNOSIS — S78112A Complete traumatic amputation at level between left hip and knee, initial encounter: Secondary | ICD-10-CM | POA: Diagnosis not present

## 2023-01-08 DIAGNOSIS — I70292 Other atherosclerosis of native arteries of extremities, left leg: Secondary | ICD-10-CM | POA: Diagnosis not present

## 2023-01-09 DIAGNOSIS — R2681 Unsteadiness on feet: Secondary | ICD-10-CM | POA: Diagnosis not present

## 2023-01-09 DIAGNOSIS — R531 Weakness: Secondary | ICD-10-CM | POA: Diagnosis not present

## 2023-01-09 DIAGNOSIS — N179 Acute kidney failure, unspecified: Secondary | ICD-10-CM | POA: Diagnosis not present

## 2023-01-14 DIAGNOSIS — N179 Acute kidney failure, unspecified: Secondary | ICD-10-CM | POA: Diagnosis not present

## 2023-01-14 DIAGNOSIS — R52 Pain, unspecified: Secondary | ICD-10-CM | POA: Diagnosis not present

## 2023-01-14 DIAGNOSIS — I10 Essential (primary) hypertension: Secondary | ICD-10-CM | POA: Diagnosis not present

## 2023-01-14 DIAGNOSIS — K219 Gastro-esophageal reflux disease without esophagitis: Secondary | ICD-10-CM | POA: Diagnosis not present

## 2023-01-14 DIAGNOSIS — J189 Pneumonia, unspecified organism: Secondary | ICD-10-CM | POA: Diagnosis not present

## 2023-01-14 DIAGNOSIS — R531 Weakness: Secondary | ICD-10-CM | POA: Diagnosis not present

## 2023-01-14 DIAGNOSIS — R2681 Unsteadiness on feet: Secondary | ICD-10-CM | POA: Diagnosis not present

## 2023-01-15 DIAGNOSIS — S78112A Complete traumatic amputation at level between left hip and knee, initial encounter: Secondary | ICD-10-CM | POA: Diagnosis not present

## 2023-01-15 DIAGNOSIS — I70292 Other atherosclerosis of native arteries of extremities, left leg: Secondary | ICD-10-CM | POA: Diagnosis not present

## 2023-01-16 DIAGNOSIS — N179 Acute kidney failure, unspecified: Secondary | ICD-10-CM | POA: Diagnosis not present

## 2023-01-16 DIAGNOSIS — R531 Weakness: Secondary | ICD-10-CM | POA: Diagnosis not present

## 2023-01-16 DIAGNOSIS — R2681 Unsteadiness on feet: Secondary | ICD-10-CM | POA: Diagnosis not present

## 2023-01-21 DIAGNOSIS — R2681 Unsteadiness on feet: Secondary | ICD-10-CM | POA: Diagnosis not present

## 2023-01-21 DIAGNOSIS — N179 Acute kidney failure, unspecified: Secondary | ICD-10-CM | POA: Diagnosis not present

## 2023-01-21 DIAGNOSIS — R531 Weakness: Secondary | ICD-10-CM | POA: Diagnosis not present

## 2023-01-22 DIAGNOSIS — I70292 Other atherosclerosis of native arteries of extremities, left leg: Secondary | ICD-10-CM | POA: Diagnosis not present

## 2023-01-22 DIAGNOSIS — S78112A Complete traumatic amputation at level between left hip and knee, initial encounter: Secondary | ICD-10-CM | POA: Diagnosis not present

## 2023-01-23 DIAGNOSIS — N179 Acute kidney failure, unspecified: Secondary | ICD-10-CM | POA: Diagnosis not present

## 2023-01-23 DIAGNOSIS — R531 Weakness: Secondary | ICD-10-CM | POA: Diagnosis not present

## 2023-01-23 DIAGNOSIS — R2681 Unsteadiness on feet: Secondary | ICD-10-CM | POA: Diagnosis not present

## 2023-01-24 DIAGNOSIS — J188 Other pneumonia, unspecified organism: Secondary | ICD-10-CM | POA: Diagnosis not present

## 2023-01-28 DIAGNOSIS — R2681 Unsteadiness on feet: Secondary | ICD-10-CM | POA: Diagnosis not present

## 2023-01-28 DIAGNOSIS — R531 Weakness: Secondary | ICD-10-CM | POA: Diagnosis not present

## 2023-01-28 DIAGNOSIS — N179 Acute kidney failure, unspecified: Secondary | ICD-10-CM | POA: Diagnosis not present

## 2023-01-29 DIAGNOSIS — E119 Type 2 diabetes mellitus without complications: Secondary | ICD-10-CM | POA: Diagnosis not present

## 2023-01-29 DIAGNOSIS — N390 Urinary tract infection, site not specified: Secondary | ICD-10-CM | POA: Diagnosis not present

## 2023-01-29 DIAGNOSIS — E559 Vitamin D deficiency, unspecified: Secondary | ICD-10-CM | POA: Diagnosis not present

## 2023-01-29 DIAGNOSIS — I10 Essential (primary) hypertension: Secondary | ICD-10-CM | POA: Diagnosis not present

## 2023-01-30 DIAGNOSIS — R531 Weakness: Secondary | ICD-10-CM | POA: Diagnosis not present

## 2023-01-30 DIAGNOSIS — N186 End stage renal disease: Secondary | ICD-10-CM | POA: Diagnosis not present

## 2023-01-30 DIAGNOSIS — T8611 Kidney transplant rejection: Secondary | ICD-10-CM | POA: Diagnosis not present

## 2023-01-30 DIAGNOSIS — R7989 Other specified abnormal findings of blood chemistry: Secondary | ICD-10-CM | POA: Diagnosis not present

## 2023-01-30 DIAGNOSIS — N179 Acute kidney failure, unspecified: Secondary | ICD-10-CM | POA: Diagnosis not present

## 2023-01-30 DIAGNOSIS — N390 Urinary tract infection, site not specified: Secondary | ICD-10-CM | POA: Diagnosis not present

## 2023-01-30 DIAGNOSIS — R2681 Unsteadiness on feet: Secondary | ICD-10-CM | POA: Diagnosis not present

## 2023-02-01 DIAGNOSIS — Z8616 Personal history of COVID-19: Secondary | ICD-10-CM | POA: Diagnosis not present

## 2023-02-01 DIAGNOSIS — I739 Peripheral vascular disease, unspecified: Secondary | ICD-10-CM | POA: Diagnosis not present

## 2023-02-01 DIAGNOSIS — Z7982 Long term (current) use of aspirin: Secondary | ICD-10-CM | POA: Diagnosis not present

## 2023-02-01 DIAGNOSIS — R2681 Unsteadiness on feet: Secondary | ICD-10-CM | POA: Diagnosis not present

## 2023-02-01 DIAGNOSIS — I4510 Unspecified right bundle-branch block: Secondary | ICD-10-CM | POA: Diagnosis not present

## 2023-02-01 DIAGNOSIS — R0989 Other specified symptoms and signs involving the circulatory and respiratory systems: Secondary | ICD-10-CM | POA: Diagnosis not present

## 2023-02-01 DIAGNOSIS — R1311 Dysphagia, oral phase: Secondary | ICD-10-CM | POA: Diagnosis not present

## 2023-02-01 DIAGNOSIS — Z79621 Long term (current) use of calcineurin inhibitor: Secondary | ICD-10-CM | POA: Diagnosis not present

## 2023-02-01 DIAGNOSIS — I480 Paroxysmal atrial fibrillation: Secondary | ICD-10-CM | POA: Diagnosis not present

## 2023-02-01 DIAGNOSIS — Z940 Kidney transplant status: Secondary | ICD-10-CM | POA: Diagnosis not present

## 2023-02-01 DIAGNOSIS — N179 Acute kidney failure, unspecified: Secondary | ICD-10-CM | POA: Diagnosis not present

## 2023-02-01 DIAGNOSIS — N185 Chronic kidney disease, stage 5: Secondary | ICD-10-CM | POA: Diagnosis not present

## 2023-02-01 DIAGNOSIS — E785 Hyperlipidemia, unspecified: Secondary | ICD-10-CM | POA: Diagnosis not present

## 2023-02-01 DIAGNOSIS — N281 Cyst of kidney, acquired: Secondary | ICD-10-CM | POA: Diagnosis not present

## 2023-02-01 DIAGNOSIS — I350 Nonrheumatic aortic (valve) stenosis: Secondary | ICD-10-CM | POA: Diagnosis not present

## 2023-02-01 DIAGNOSIS — Y830 Surgical operation with transplant of whole organ as the cause of abnormal reaction of the patient, or of later complication, without mention of misadventure at the time of the procedure: Secondary | ICD-10-CM | POA: Diagnosis not present

## 2023-02-01 DIAGNOSIS — Z743 Need for continuous supervision: Secondary | ICD-10-CM | POA: Diagnosis not present

## 2023-02-01 DIAGNOSIS — D84821 Immunodeficiency due to drugs: Secondary | ICD-10-CM | POA: Diagnosis not present

## 2023-02-01 DIAGNOSIS — J449 Chronic obstructive pulmonary disease, unspecified: Secondary | ICD-10-CM | POA: Diagnosis not present

## 2023-02-01 DIAGNOSIS — R6889 Other general symptoms and signs: Secondary | ICD-10-CM | POA: Diagnosis not present

## 2023-02-01 DIAGNOSIS — R9389 Abnormal findings on diagnostic imaging of other specified body structures: Secondary | ICD-10-CM | POA: Diagnosis not present

## 2023-02-01 DIAGNOSIS — M109 Gout, unspecified: Secondary | ICD-10-CM | POA: Diagnosis not present

## 2023-02-01 DIAGNOSIS — D631 Anemia in chronic kidney disease: Secondary | ICD-10-CM | POA: Diagnosis not present

## 2023-02-01 DIAGNOSIS — Y828 Other medical devices associated with adverse incidents: Secondary | ICD-10-CM | POA: Diagnosis not present

## 2023-02-01 DIAGNOSIS — I2510 Atherosclerotic heart disease of native coronary artery without angina pectoris: Secondary | ICD-10-CM | POA: Diagnosis not present

## 2023-02-01 DIAGNOSIS — K219 Gastro-esophageal reflux disease without esophagitis: Secondary | ICD-10-CM | POA: Diagnosis not present

## 2023-02-01 DIAGNOSIS — R0602 Shortness of breath: Secondary | ICD-10-CM | POA: Diagnosis not present

## 2023-02-01 DIAGNOSIS — I120 Hypertensive chronic kidney disease with stage 5 chronic kidney disease or end stage renal disease: Secondary | ICD-10-CM | POA: Diagnosis not present

## 2023-02-01 DIAGNOSIS — M6281 Muscle weakness (generalized): Secondary | ICD-10-CM | POA: Diagnosis not present

## 2023-02-01 DIAGNOSIS — Z96652 Presence of left artificial knee joint: Secondary | ICD-10-CM | POA: Diagnosis not present

## 2023-02-01 DIAGNOSIS — R531 Weakness: Secondary | ICD-10-CM | POA: Diagnosis not present

## 2023-02-01 DIAGNOSIS — T8619 Other complication of kidney transplant: Secondary | ICD-10-CM | POA: Diagnosis not present

## 2023-02-01 DIAGNOSIS — R001 Bradycardia, unspecified: Secondary | ICD-10-CM | POA: Diagnosis not present

## 2023-02-01 DIAGNOSIS — N184 Chronic kidney disease, stage 4 (severe): Secondary | ICD-10-CM | POA: Diagnosis not present

## 2023-02-01 DIAGNOSIS — Z89612 Acquired absence of left leg above knee: Secondary | ICD-10-CM | POA: Diagnosis not present

## 2023-02-08 DIAGNOSIS — F424 Excoriation (skin-picking) disorder: Secondary | ICD-10-CM | POA: Diagnosis not present

## 2023-02-11 DIAGNOSIS — N184 Chronic kidney disease, stage 4 (severe): Secondary | ICD-10-CM | POA: Diagnosis not present

## 2023-02-11 DIAGNOSIS — R2681 Unsteadiness on feet: Secondary | ICD-10-CM | POA: Diagnosis not present

## 2023-02-11 DIAGNOSIS — M109 Gout, unspecified: Secondary | ICD-10-CM | POA: Diagnosis not present

## 2023-02-11 DIAGNOSIS — R531 Weakness: Secondary | ICD-10-CM | POA: Diagnosis not present

## 2023-02-11 DIAGNOSIS — N179 Acute kidney failure, unspecified: Secondary | ICD-10-CM | POA: Diagnosis not present

## 2023-02-11 DIAGNOSIS — K219 Gastro-esophageal reflux disease without esophagitis: Secondary | ICD-10-CM | POA: Diagnosis not present

## 2023-02-11 DIAGNOSIS — N189 Chronic kidney disease, unspecified: Secondary | ICD-10-CM | POA: Diagnosis not present

## 2023-02-12 DIAGNOSIS — I10 Essential (primary) hypertension: Secondary | ICD-10-CM | POA: Diagnosis not present

## 2023-02-13 DIAGNOSIS — Z23 Encounter for immunization: Secondary | ICD-10-CM | POA: Diagnosis not present

## 2023-02-13 DIAGNOSIS — R2681 Unsteadiness on feet: Secondary | ICD-10-CM | POA: Diagnosis not present

## 2023-02-13 DIAGNOSIS — N179 Acute kidney failure, unspecified: Secondary | ICD-10-CM | POA: Diagnosis not present

## 2023-02-13 DIAGNOSIS — R531 Weakness: Secondary | ICD-10-CM | POA: Diagnosis not present

## 2023-02-13 DIAGNOSIS — Z4781 Encounter for orthopedic aftercare following surgical amputation: Secondary | ICD-10-CM | POA: Diagnosis not present

## 2023-02-14 DIAGNOSIS — Z0289 Encounter for other administrative examinations: Secondary | ICD-10-CM | POA: Diagnosis not present

## 2023-02-18 DIAGNOSIS — N179 Acute kidney failure, unspecified: Secondary | ICD-10-CM | POA: Diagnosis not present

## 2023-02-18 DIAGNOSIS — R2681 Unsteadiness on feet: Secondary | ICD-10-CM | POA: Diagnosis not present

## 2023-02-18 DIAGNOSIS — R531 Weakness: Secondary | ICD-10-CM | POA: Diagnosis not present

## 2023-02-20 DIAGNOSIS — N179 Acute kidney failure, unspecified: Secondary | ICD-10-CM | POA: Diagnosis not present

## 2023-02-20 DIAGNOSIS — R2681 Unsteadiness on feet: Secondary | ICD-10-CM | POA: Diagnosis not present

## 2023-02-20 DIAGNOSIS — R531 Weakness: Secondary | ICD-10-CM | POA: Diagnosis not present

## 2023-03-04 DIAGNOSIS — R531 Weakness: Secondary | ICD-10-CM | POA: Diagnosis not present

## 2023-03-04 DIAGNOSIS — R2681 Unsteadiness on feet: Secondary | ICD-10-CM | POA: Diagnosis not present

## 2023-03-04 DIAGNOSIS — N179 Acute kidney failure, unspecified: Secondary | ICD-10-CM | POA: Diagnosis not present

## 2023-03-06 DIAGNOSIS — I4891 Unspecified atrial fibrillation: Secondary | ICD-10-CM | POA: Diagnosis not present

## 2023-03-06 DIAGNOSIS — N179 Acute kidney failure, unspecified: Secondary | ICD-10-CM | POA: Diagnosis not present

## 2023-03-06 DIAGNOSIS — N185 Chronic kidney disease, stage 5: Secondary | ICD-10-CM | POA: Diagnosis not present

## 2023-03-06 DIAGNOSIS — T8611 Kidney transplant rejection: Secondary | ICD-10-CM | POA: Diagnosis not present

## 2023-03-06 DIAGNOSIS — Z89612 Acquired absence of left leg above knee: Secondary | ICD-10-CM | POA: Diagnosis not present

## 2023-03-06 DIAGNOSIS — R2681 Unsteadiness on feet: Secondary | ICD-10-CM | POA: Diagnosis not present

## 2023-03-06 DIAGNOSIS — R531 Weakness: Secondary | ICD-10-CM | POA: Diagnosis not present

## 2023-03-11 DIAGNOSIS — N179 Acute kidney failure, unspecified: Secondary | ICD-10-CM | POA: Diagnosis not present

## 2023-03-11 DIAGNOSIS — R2681 Unsteadiness on feet: Secondary | ICD-10-CM | POA: Diagnosis not present

## 2023-03-11 DIAGNOSIS — K219 Gastro-esophageal reflux disease without esophagitis: Secondary | ICD-10-CM | POA: Diagnosis not present

## 2023-03-11 DIAGNOSIS — R531 Weakness: Secondary | ICD-10-CM | POA: Diagnosis not present

## 2023-03-13 DIAGNOSIS — R531 Weakness: Secondary | ICD-10-CM | POA: Diagnosis not present

## 2023-03-13 DIAGNOSIS — I4891 Unspecified atrial fibrillation: Secondary | ICD-10-CM | POA: Diagnosis not present

## 2023-03-13 DIAGNOSIS — D509 Iron deficiency anemia, unspecified: Secondary | ICD-10-CM | POA: Diagnosis not present

## 2023-03-13 DIAGNOSIS — N185 Chronic kidney disease, stage 5: Secondary | ICD-10-CM | POA: Diagnosis not present

## 2023-03-13 DIAGNOSIS — T8611 Kidney transplant rejection: Secondary | ICD-10-CM | POA: Diagnosis not present

## 2023-03-13 DIAGNOSIS — N179 Acute kidney failure, unspecified: Secondary | ICD-10-CM | POA: Diagnosis not present

## 2023-03-13 DIAGNOSIS — R2681 Unsteadiness on feet: Secondary | ICD-10-CM | POA: Diagnosis not present

## 2023-03-14 DIAGNOSIS — E7800 Pure hypercholesterolemia, unspecified: Secondary | ICD-10-CM | POA: Diagnosis not present

## 2023-03-14 DIAGNOSIS — I4510 Unspecified right bundle-branch block: Secondary | ICD-10-CM | POA: Diagnosis not present

## 2023-03-14 DIAGNOSIS — I350 Nonrheumatic aortic (valve) stenosis: Secondary | ICD-10-CM | POA: Diagnosis not present

## 2023-03-15 DIAGNOSIS — M109 Gout, unspecified: Secondary | ICD-10-CM | POA: Diagnosis not present

## 2023-03-15 DIAGNOSIS — N185 Chronic kidney disease, stage 5: Secondary | ICD-10-CM | POA: Diagnosis not present

## 2023-03-15 DIAGNOSIS — L989 Disorder of the skin and subcutaneous tissue, unspecified: Secondary | ICD-10-CM | POA: Diagnosis not present

## 2023-03-15 DIAGNOSIS — I10 Essential (primary) hypertension: Secondary | ICD-10-CM | POA: Diagnosis not present

## 2023-03-20 DIAGNOSIS — R531 Weakness: Secondary | ICD-10-CM | POA: Diagnosis not present

## 2023-03-20 DIAGNOSIS — N179 Acute kidney failure, unspecified: Secondary | ICD-10-CM | POA: Diagnosis not present

## 2023-03-20 DIAGNOSIS — R2681 Unsteadiness on feet: Secondary | ICD-10-CM | POA: Diagnosis not present

## 2023-04-01 DIAGNOSIS — R2681 Unsteadiness on feet: Secondary | ICD-10-CM | POA: Diagnosis not present

## 2023-04-01 DIAGNOSIS — N179 Acute kidney failure, unspecified: Secondary | ICD-10-CM | POA: Diagnosis not present

## 2023-04-01 DIAGNOSIS — R531 Weakness: Secondary | ICD-10-CM | POA: Diagnosis not present

## 2023-04-03 DIAGNOSIS — T8611 Kidney transplant rejection: Secondary | ICD-10-CM | POA: Diagnosis not present

## 2023-04-03 DIAGNOSIS — K219 Gastro-esophageal reflux disease without esophagitis: Secondary | ICD-10-CM | POA: Diagnosis not present

## 2023-04-03 DIAGNOSIS — N189 Chronic kidney disease, unspecified: Secondary | ICD-10-CM | POA: Diagnosis not present

## 2023-04-03 DIAGNOSIS — E785 Hyperlipidemia, unspecified: Secondary | ICD-10-CM | POA: Diagnosis not present

## 2023-04-08 DIAGNOSIS — I10 Essential (primary) hypertension: Secondary | ICD-10-CM | POA: Diagnosis not present

## 2023-04-08 DIAGNOSIS — N185 Chronic kidney disease, stage 5: Secondary | ICD-10-CM | POA: Diagnosis not present

## 2023-04-09 DIAGNOSIS — M6281 Muscle weakness (generalized): Secondary | ICD-10-CM | POA: Diagnosis not present

## 2023-04-09 DIAGNOSIS — K219 Gastro-esophageal reflux disease without esophagitis: Secondary | ICD-10-CM | POA: Diagnosis not present

## 2023-04-09 DIAGNOSIS — N185 Chronic kidney disease, stage 5: Secondary | ICD-10-CM | POA: Diagnosis not present

## 2023-04-09 DIAGNOSIS — R1311 Dysphagia, oral phase: Secondary | ICD-10-CM | POA: Diagnosis not present

## 2023-04-09 DIAGNOSIS — Z89612 Acquired absence of left leg above knee: Secondary | ICD-10-CM | POA: Diagnosis not present

## 2023-04-09 DIAGNOSIS — R2681 Unsteadiness on feet: Secondary | ICD-10-CM | POA: Diagnosis not present

## 2023-04-10 DIAGNOSIS — R2681 Unsteadiness on feet: Secondary | ICD-10-CM | POA: Diagnosis not present

## 2023-04-10 DIAGNOSIS — K219 Gastro-esophageal reflux disease without esophagitis: Secondary | ICD-10-CM | POA: Diagnosis not present

## 2023-04-10 DIAGNOSIS — Z89612 Acquired absence of left leg above knee: Secondary | ICD-10-CM | POA: Diagnosis not present

## 2023-04-10 DIAGNOSIS — M6281 Muscle weakness (generalized): Secondary | ICD-10-CM | POA: Diagnosis not present

## 2023-04-10 DIAGNOSIS — R1311 Dysphagia, oral phase: Secondary | ICD-10-CM | POA: Diagnosis not present

## 2023-04-10 DIAGNOSIS — N185 Chronic kidney disease, stage 5: Secondary | ICD-10-CM | POA: Diagnosis not present

## 2023-04-11 DIAGNOSIS — Z89612 Acquired absence of left leg above knee: Secondary | ICD-10-CM | POA: Diagnosis not present

## 2023-04-11 DIAGNOSIS — R1311 Dysphagia, oral phase: Secondary | ICD-10-CM | POA: Diagnosis not present

## 2023-04-11 DIAGNOSIS — R2681 Unsteadiness on feet: Secondary | ICD-10-CM | POA: Diagnosis not present

## 2023-04-11 DIAGNOSIS — K219 Gastro-esophageal reflux disease without esophagitis: Secondary | ICD-10-CM | POA: Diagnosis not present

## 2023-04-11 DIAGNOSIS — N185 Chronic kidney disease, stage 5: Secondary | ICD-10-CM | POA: Diagnosis not present

## 2023-04-11 DIAGNOSIS — M6281 Muscle weakness (generalized): Secondary | ICD-10-CM | POA: Diagnosis not present

## 2023-04-15 DIAGNOSIS — R2681 Unsteadiness on feet: Secondary | ICD-10-CM | POA: Diagnosis not present

## 2023-04-15 DIAGNOSIS — N179 Acute kidney failure, unspecified: Secondary | ICD-10-CM | POA: Diagnosis not present

## 2023-04-15 DIAGNOSIS — R531 Weakness: Secondary | ICD-10-CM | POA: Diagnosis not present

## 2023-04-17 DIAGNOSIS — R531 Weakness: Secondary | ICD-10-CM | POA: Diagnosis not present

## 2023-04-17 DIAGNOSIS — N179 Acute kidney failure, unspecified: Secondary | ICD-10-CM | POA: Diagnosis not present

## 2023-04-17 DIAGNOSIS — R2681 Unsteadiness on feet: Secondary | ICD-10-CM | POA: Diagnosis not present

## 2023-04-22 DIAGNOSIS — R531 Weakness: Secondary | ICD-10-CM | POA: Diagnosis not present

## 2023-04-22 DIAGNOSIS — N179 Acute kidney failure, unspecified: Secondary | ICD-10-CM | POA: Diagnosis not present

## 2023-04-22 DIAGNOSIS — R2681 Unsteadiness on feet: Secondary | ICD-10-CM | POA: Diagnosis not present

## 2023-04-30 DIAGNOSIS — K219 Gastro-esophageal reflux disease without esophagitis: Secondary | ICD-10-CM | POA: Diagnosis not present

## 2023-04-30 DIAGNOSIS — N185 Chronic kidney disease, stage 5: Secondary | ICD-10-CM | POA: Diagnosis not present

## 2023-04-30 DIAGNOSIS — Z89612 Acquired absence of left leg above knee: Secondary | ICD-10-CM | POA: Diagnosis not present

## 2023-04-30 DIAGNOSIS — M6281 Muscle weakness (generalized): Secondary | ICD-10-CM | POA: Diagnosis not present

## 2023-04-30 DIAGNOSIS — R1311 Dysphagia, oral phase: Secondary | ICD-10-CM | POA: Diagnosis not present

## 2023-04-30 DIAGNOSIS — R2681 Unsteadiness on feet: Secondary | ICD-10-CM | POA: Diagnosis not present

## 2023-05-03 DIAGNOSIS — R2681 Unsteadiness on feet: Secondary | ICD-10-CM | POA: Diagnosis not present

## 2023-05-03 DIAGNOSIS — K219 Gastro-esophageal reflux disease without esophagitis: Secondary | ICD-10-CM | POA: Diagnosis not present

## 2023-05-03 DIAGNOSIS — N185 Chronic kidney disease, stage 5: Secondary | ICD-10-CM | POA: Diagnosis not present

## 2023-05-03 DIAGNOSIS — M6281 Muscle weakness (generalized): Secondary | ICD-10-CM | POA: Diagnosis not present

## 2023-05-03 DIAGNOSIS — R1311 Dysphagia, oral phase: Secondary | ICD-10-CM | POA: Diagnosis not present

## 2023-05-03 DIAGNOSIS — Z89612 Acquired absence of left leg above knee: Secondary | ICD-10-CM | POA: Diagnosis not present

## 2023-05-04 DIAGNOSIS — I4891 Unspecified atrial fibrillation: Secondary | ICD-10-CM | POA: Diagnosis not present

## 2023-05-04 DIAGNOSIS — M109 Gout, unspecified: Secondary | ICD-10-CM | POA: Diagnosis not present

## 2023-05-04 DIAGNOSIS — E46 Unspecified protein-calorie malnutrition: Secondary | ICD-10-CM | POA: Diagnosis not present

## 2023-05-04 DIAGNOSIS — I350 Nonrheumatic aortic (valve) stenosis: Secondary | ICD-10-CM | POA: Diagnosis not present

## 2023-05-04 DIAGNOSIS — M25531 Pain in right wrist: Secondary | ICD-10-CM | POA: Diagnosis not present

## 2023-05-04 DIAGNOSIS — M19031 Primary osteoarthritis, right wrist: Secondary | ICD-10-CM | POA: Diagnosis not present

## 2023-05-06 DIAGNOSIS — M109 Gout, unspecified: Secondary | ICD-10-CM | POA: Diagnosis not present

## 2023-05-06 DIAGNOSIS — R1311 Dysphagia, oral phase: Secondary | ICD-10-CM | POA: Diagnosis not present

## 2023-05-06 DIAGNOSIS — K219 Gastro-esophageal reflux disease without esophagitis: Secondary | ICD-10-CM | POA: Diagnosis not present

## 2023-05-06 DIAGNOSIS — D649 Anemia, unspecified: Secondary | ICD-10-CM | POA: Diagnosis not present

## 2023-05-06 DIAGNOSIS — N185 Chronic kidney disease, stage 5: Secondary | ICD-10-CM | POA: Diagnosis not present

## 2023-05-07 DIAGNOSIS — Z89612 Acquired absence of left leg above knee: Secondary | ICD-10-CM | POA: Diagnosis not present

## 2023-05-07 DIAGNOSIS — T8611 Kidney transplant rejection: Secondary | ICD-10-CM | POA: Diagnosis not present

## 2023-05-07 DIAGNOSIS — N185 Chronic kidney disease, stage 5: Secondary | ICD-10-CM | POA: Diagnosis not present

## 2023-05-07 DIAGNOSIS — M109 Gout, unspecified: Secondary | ICD-10-CM | POA: Diagnosis not present

## 2023-05-08 DIAGNOSIS — N185 Chronic kidney disease, stage 5: Secondary | ICD-10-CM | POA: Diagnosis not present

## 2023-05-08 DIAGNOSIS — I10 Essential (primary) hypertension: Secondary | ICD-10-CM | POA: Diagnosis not present

## 2023-05-08 DIAGNOSIS — N186 End stage renal disease: Secondary | ICD-10-CM | POA: Diagnosis not present

## 2023-05-08 DIAGNOSIS — Z89612 Acquired absence of left leg above knee: Secondary | ICD-10-CM | POA: Diagnosis not present

## 2023-05-08 DIAGNOSIS — I4891 Unspecified atrial fibrillation: Secondary | ICD-10-CM | POA: Diagnosis not present

## 2023-05-08 DIAGNOSIS — D509 Iron deficiency anemia, unspecified: Secondary | ICD-10-CM | POA: Diagnosis not present

## 2023-05-09 DIAGNOSIS — R1311 Dysphagia, oral phase: Secondary | ICD-10-CM | POA: Diagnosis not present

## 2023-05-09 DIAGNOSIS — N185 Chronic kidney disease, stage 5: Secondary | ICD-10-CM | POA: Diagnosis not present

## 2023-05-09 DIAGNOSIS — K219 Gastro-esophageal reflux disease without esophagitis: Secondary | ICD-10-CM | POA: Diagnosis not present

## 2023-05-17 DIAGNOSIS — Z89612 Acquired absence of left leg above knee: Secondary | ICD-10-CM | POA: Diagnosis not present

## 2023-05-17 DIAGNOSIS — N185 Chronic kidney disease, stage 5: Secondary | ICD-10-CM | POA: Diagnosis not present

## 2023-05-17 DIAGNOSIS — I4891 Unspecified atrial fibrillation: Secondary | ICD-10-CM | POA: Diagnosis not present

## 2023-05-27 DIAGNOSIS — M109 Gout, unspecified: Secondary | ICD-10-CM | POA: Diagnosis not present

## 2023-05-27 DIAGNOSIS — R7989 Other specified abnormal findings of blood chemistry: Secondary | ICD-10-CM | POA: Diagnosis not present

## 2023-06-01 DIAGNOSIS — T8611 Kidney transplant rejection: Secondary | ICD-10-CM | POA: Diagnosis not present

## 2023-06-01 DIAGNOSIS — E785 Hyperlipidemia, unspecified: Secondary | ICD-10-CM | POA: Diagnosis not present

## 2023-06-01 DIAGNOSIS — I4891 Unspecified atrial fibrillation: Secondary | ICD-10-CM | POA: Diagnosis not present

## 2023-06-01 DIAGNOSIS — M109 Gout, unspecified: Secondary | ICD-10-CM | POA: Diagnosis not present

## 2023-06-05 DIAGNOSIS — I10 Essential (primary) hypertension: Secondary | ICD-10-CM | POA: Diagnosis not present

## 2023-06-05 DIAGNOSIS — N185 Chronic kidney disease, stage 5: Secondary | ICD-10-CM | POA: Diagnosis not present

## 2023-06-11 DIAGNOSIS — Z89612 Acquired absence of left leg above knee: Secondary | ICD-10-CM | POA: Diagnosis not present

## 2023-06-11 DIAGNOSIS — N185 Chronic kidney disease, stage 5: Secondary | ICD-10-CM | POA: Diagnosis not present

## 2023-06-11 DIAGNOSIS — T8611 Kidney transplant rejection: Secondary | ICD-10-CM | POA: Diagnosis not present

## 2023-06-11 DIAGNOSIS — I4891 Unspecified atrial fibrillation: Secondary | ICD-10-CM | POA: Diagnosis not present

## 2023-06-14 DIAGNOSIS — I4891 Unspecified atrial fibrillation: Secondary | ICD-10-CM | POA: Diagnosis not present

## 2023-06-14 DIAGNOSIS — R059 Cough, unspecified: Secondary | ICD-10-CM | POA: Diagnosis not present

## 2023-06-14 DIAGNOSIS — R0982 Postnasal drip: Secondary | ICD-10-CM | POA: Diagnosis not present

## 2023-06-14 DIAGNOSIS — N185 Chronic kidney disease, stage 5: Secondary | ICD-10-CM | POA: Diagnosis not present

## 2023-06-19 DIAGNOSIS — Z89612 Acquired absence of left leg above knee: Secondary | ICD-10-CM | POA: Diagnosis not present

## 2023-07-02 DIAGNOSIS — M6250 Muscle wasting and atrophy, not elsewhere classified, unspecified site: Secondary | ICD-10-CM | POA: Diagnosis not present

## 2023-07-02 DIAGNOSIS — I350 Nonrheumatic aortic (valve) stenosis: Secondary | ICD-10-CM | POA: Diagnosis not present

## 2023-07-02 DIAGNOSIS — R278 Other lack of coordination: Secondary | ICD-10-CM | POA: Diagnosis not present

## 2023-07-02 DIAGNOSIS — I4891 Unspecified atrial fibrillation: Secondary | ICD-10-CM | POA: Diagnosis not present

## 2023-07-02 DIAGNOSIS — K219 Gastro-esophageal reflux disease without esophagitis: Secondary | ICD-10-CM | POA: Diagnosis not present

## 2023-07-02 DIAGNOSIS — E46 Unspecified protein-calorie malnutrition: Secondary | ICD-10-CM | POA: Diagnosis not present

## 2023-07-02 DIAGNOSIS — Z4781 Encounter for orthopedic aftercare following surgical amputation: Secondary | ICD-10-CM | POA: Diagnosis not present

## 2023-07-02 DIAGNOSIS — N185 Chronic kidney disease, stage 5: Secondary | ICD-10-CM | POA: Diagnosis not present

## 2023-07-03 DIAGNOSIS — M6250 Muscle wasting and atrophy, not elsewhere classified, unspecified site: Secondary | ICD-10-CM | POA: Diagnosis not present

## 2023-07-03 DIAGNOSIS — Z4781 Encounter for orthopedic aftercare following surgical amputation: Secondary | ICD-10-CM | POA: Diagnosis not present

## 2023-07-03 DIAGNOSIS — N185 Chronic kidney disease, stage 5: Secondary | ICD-10-CM | POA: Diagnosis not present

## 2023-07-03 DIAGNOSIS — R278 Other lack of coordination: Secondary | ICD-10-CM | POA: Diagnosis not present

## 2023-07-04 DIAGNOSIS — N185 Chronic kidney disease, stage 5: Secondary | ICD-10-CM | POA: Diagnosis not present

## 2023-07-04 DIAGNOSIS — I10 Essential (primary) hypertension: Secondary | ICD-10-CM | POA: Diagnosis not present

## 2023-07-08 DIAGNOSIS — Z4781 Encounter for orthopedic aftercare following surgical amputation: Secondary | ICD-10-CM | POA: Diagnosis not present

## 2023-07-08 DIAGNOSIS — M6250 Muscle wasting and atrophy, not elsewhere classified, unspecified site: Secondary | ICD-10-CM | POA: Diagnosis not present

## 2023-07-08 DIAGNOSIS — R278 Other lack of coordination: Secondary | ICD-10-CM | POA: Diagnosis not present

## 2023-07-08 DIAGNOSIS — N185 Chronic kidney disease, stage 5: Secondary | ICD-10-CM | POA: Diagnosis not present

## 2023-07-09 DIAGNOSIS — M6250 Muscle wasting and atrophy, not elsewhere classified, unspecified site: Secondary | ICD-10-CM | POA: Diagnosis not present

## 2023-07-09 DIAGNOSIS — N185 Chronic kidney disease, stage 5: Secondary | ICD-10-CM | POA: Diagnosis not present

## 2023-07-09 DIAGNOSIS — R278 Other lack of coordination: Secondary | ICD-10-CM | POA: Diagnosis not present

## 2023-07-09 DIAGNOSIS — Z4781 Encounter for orthopedic aftercare following surgical amputation: Secondary | ICD-10-CM | POA: Diagnosis not present

## 2023-07-15 DIAGNOSIS — I4891 Unspecified atrial fibrillation: Secondary | ICD-10-CM | POA: Diagnosis not present

## 2023-07-15 DIAGNOSIS — Z89612 Acquired absence of left leg above knee: Secondary | ICD-10-CM | POA: Diagnosis not present

## 2023-07-15 DIAGNOSIS — K219 Gastro-esophageal reflux disease without esophagitis: Secondary | ICD-10-CM | POA: Diagnosis not present

## 2023-07-15 DIAGNOSIS — I10 Essential (primary) hypertension: Secondary | ICD-10-CM | POA: Diagnosis not present

## 2023-08-05 DIAGNOSIS — L84 Corns and callosities: Secondary | ICD-10-CM | POA: Diagnosis not present

## 2023-08-05 DIAGNOSIS — L602 Onychogryphosis: Secondary | ICD-10-CM | POA: Diagnosis not present

## 2023-08-05 DIAGNOSIS — Z794 Long term (current) use of insulin: Secondary | ICD-10-CM | POA: Diagnosis not present

## 2023-08-05 DIAGNOSIS — E1151 Type 2 diabetes mellitus with diabetic peripheral angiopathy without gangrene: Secondary | ICD-10-CM | POA: Diagnosis not present

## 2023-08-05 DIAGNOSIS — N185 Chronic kidney disease, stage 5: Secondary | ICD-10-CM | POA: Diagnosis not present

## 2023-08-05 DIAGNOSIS — I10 Essential (primary) hypertension: Secondary | ICD-10-CM | POA: Diagnosis not present

## 2023-08-05 DIAGNOSIS — L603 Nail dystrophy: Secondary | ICD-10-CM | POA: Diagnosis not present

## 2023-08-14 DIAGNOSIS — I350 Nonrheumatic aortic (valve) stenosis: Secondary | ICD-10-CM | POA: Diagnosis not present

## 2023-08-14 DIAGNOSIS — I4891 Unspecified atrial fibrillation: Secondary | ICD-10-CM | POA: Diagnosis not present

## 2023-08-14 DIAGNOSIS — N185 Chronic kidney disease, stage 5: Secondary | ICD-10-CM | POA: Diagnosis not present

## 2023-08-14 DIAGNOSIS — Z89612 Acquired absence of left leg above knee: Secondary | ICD-10-CM | POA: Diagnosis not present

## 2023-08-19 DIAGNOSIS — I10 Essential (primary) hypertension: Secondary | ICD-10-CM | POA: Diagnosis not present

## 2023-08-21 DIAGNOSIS — M6281 Muscle weakness (generalized): Secondary | ICD-10-CM | POA: Diagnosis not present

## 2023-08-21 DIAGNOSIS — R278 Other lack of coordination: Secondary | ICD-10-CM | POA: Diagnosis not present

## 2023-08-21 DIAGNOSIS — N185 Chronic kidney disease, stage 5: Secondary | ICD-10-CM | POA: Diagnosis not present

## 2023-08-21 DIAGNOSIS — Z89612 Acquired absence of left leg above knee: Secondary | ICD-10-CM | POA: Diagnosis not present

## 2023-08-21 DIAGNOSIS — H31012 Macula scars of posterior pole (postinflammatory) (post-traumatic), left eye: Secondary | ICD-10-CM | POA: Diagnosis not present

## 2023-08-21 DIAGNOSIS — R293 Abnormal posture: Secondary | ICD-10-CM | POA: Diagnosis not present

## 2023-08-21 DIAGNOSIS — H35013 Changes in retinal vascular appearance, bilateral: Secondary | ICD-10-CM | POA: Diagnosis not present

## 2023-08-22 DIAGNOSIS — I10 Essential (primary) hypertension: Secondary | ICD-10-CM | POA: Diagnosis not present

## 2023-08-22 DIAGNOSIS — R278 Other lack of coordination: Secondary | ICD-10-CM | POA: Diagnosis not present

## 2023-08-22 DIAGNOSIS — M6281 Muscle weakness (generalized): Secondary | ICD-10-CM | POA: Diagnosis not present

## 2023-08-22 DIAGNOSIS — K219 Gastro-esophageal reflux disease without esophagitis: Secondary | ICD-10-CM | POA: Diagnosis not present

## 2023-08-22 DIAGNOSIS — Z89612 Acquired absence of left leg above knee: Secondary | ICD-10-CM | POA: Diagnosis not present

## 2023-08-22 DIAGNOSIS — R293 Abnormal posture: Secondary | ICD-10-CM | POA: Diagnosis not present

## 2023-08-22 DIAGNOSIS — I4891 Unspecified atrial fibrillation: Secondary | ICD-10-CM | POA: Diagnosis not present

## 2023-08-22 DIAGNOSIS — N185 Chronic kidney disease, stage 5: Secondary | ICD-10-CM | POA: Diagnosis not present

## 2023-09-19 DIAGNOSIS — I10 Essential (primary) hypertension: Secondary | ICD-10-CM | POA: Diagnosis not present

## 2023-09-19 DIAGNOSIS — E7800 Pure hypercholesterolemia, unspecified: Secondary | ICD-10-CM | POA: Diagnosis not present

## 2023-09-19 DIAGNOSIS — Z89612 Acquired absence of left leg above knee: Secondary | ICD-10-CM | POA: Diagnosis not present

## 2023-09-19 DIAGNOSIS — M1712 Unilateral primary osteoarthritis, left knee: Secondary | ICD-10-CM | POA: Diagnosis not present

## 2023-09-19 DIAGNOSIS — Z125 Encounter for screening for malignant neoplasm of prostate: Secondary | ICD-10-CM | POA: Diagnosis not present

## 2023-09-19 DIAGNOSIS — Z131 Encounter for screening for diabetes mellitus: Secondary | ICD-10-CM | POA: Diagnosis not present

## 2023-09-19 DIAGNOSIS — R7303 Prediabetes: Secondary | ICD-10-CM | POA: Diagnosis not present

## 2023-09-19 DIAGNOSIS — E538 Deficiency of other specified B group vitamins: Secondary | ICD-10-CM | POA: Diagnosis not present

## 2023-09-19 DIAGNOSIS — R262 Difficulty in walking, not elsewhere classified: Secondary | ICD-10-CM | POA: Diagnosis not present

## 2023-09-19 DIAGNOSIS — M109 Gout, unspecified: Secondary | ICD-10-CM | POA: Diagnosis not present

## 2023-09-19 DIAGNOSIS — Z0001 Encounter for general adult medical examination with abnormal findings: Secondary | ICD-10-CM | POA: Diagnosis not present

## 2023-09-19 DIAGNOSIS — E559 Vitamin D deficiency, unspecified: Secondary | ICD-10-CM | POA: Diagnosis not present

## 2023-10-01 DIAGNOSIS — S91301A Unspecified open wound, right foot, initial encounter: Secondary | ICD-10-CM | POA: Diagnosis not present

## 2023-10-01 DIAGNOSIS — Z89439 Acquired absence of unspecified foot: Secondary | ICD-10-CM | POA: Diagnosis not present

## 2023-10-01 DIAGNOSIS — Z7982 Long term (current) use of aspirin: Secondary | ICD-10-CM | POA: Diagnosis not present

## 2023-10-01 DIAGNOSIS — I771 Stricture of artery: Secondary | ICD-10-CM | POA: Diagnosis not present

## 2023-10-01 DIAGNOSIS — Z940 Kidney transplant status: Secondary | ICD-10-CM | POA: Diagnosis not present

## 2023-10-01 DIAGNOSIS — R109 Unspecified abdominal pain: Secondary | ICD-10-CM | POA: Diagnosis not present

## 2023-10-01 DIAGNOSIS — M79671 Pain in right foot: Secondary | ICD-10-CM | POA: Diagnosis not present

## 2023-10-01 DIAGNOSIS — D631 Anemia in chronic kidney disease: Secondary | ICD-10-CM | POA: Diagnosis not present

## 2023-10-01 DIAGNOSIS — B9623 Unspecified Shiga toxin-producing Escherichia coli [E. coli] (STEC) as the cause of diseases classified elsewhere: Secondary | ICD-10-CM | POA: Diagnosis not present

## 2023-10-01 DIAGNOSIS — Z992 Dependence on renal dialysis: Secondary | ICD-10-CM | POA: Diagnosis not present

## 2023-10-01 DIAGNOSIS — G546 Phantom limb syndrome with pain: Secondary | ICD-10-CM | POA: Diagnosis not present

## 2023-10-01 DIAGNOSIS — I10 Essential (primary) hypertension: Secondary | ICD-10-CM | POA: Diagnosis not present

## 2023-10-01 DIAGNOSIS — Z79621 Long term (current) use of calcineurin inhibitor: Secondary | ICD-10-CM | POA: Diagnosis not present

## 2023-10-01 DIAGNOSIS — D849 Immunodeficiency, unspecified: Secondary | ICD-10-CM | POA: Diagnosis not present

## 2023-10-01 DIAGNOSIS — I700 Atherosclerosis of aorta: Secondary | ICD-10-CM | POA: Diagnosis not present

## 2023-10-01 DIAGNOSIS — N184 Chronic kidney disease, stage 4 (severe): Secondary | ICD-10-CM | POA: Diagnosis not present

## 2023-10-01 DIAGNOSIS — B964 Proteus (mirabilis) (morganii) as the cause of diseases classified elsewhere: Secondary | ICD-10-CM | POA: Diagnosis not present

## 2023-10-01 DIAGNOSIS — Z9889 Other specified postprocedural states: Secondary | ICD-10-CM | POA: Diagnosis not present

## 2023-10-01 DIAGNOSIS — Z8616 Personal history of COVID-19: Secondary | ICD-10-CM | POA: Diagnosis not present

## 2023-10-01 DIAGNOSIS — E872 Acidosis, unspecified: Secondary | ICD-10-CM | POA: Diagnosis not present

## 2023-10-01 DIAGNOSIS — M7731 Calcaneal spur, right foot: Secondary | ICD-10-CM | POA: Diagnosis not present

## 2023-10-01 DIAGNOSIS — Z89421 Acquired absence of other right toe(s): Secondary | ICD-10-CM | POA: Diagnosis not present

## 2023-10-01 DIAGNOSIS — Z91148 Patient's other noncompliance with medication regimen for other reason: Secondary | ICD-10-CM | POA: Diagnosis not present

## 2023-10-01 DIAGNOSIS — Z66 Do not resuscitate: Secondary | ICD-10-CM | POA: Diagnosis not present

## 2023-10-01 DIAGNOSIS — N186 End stage renal disease: Secondary | ICD-10-CM | POA: Diagnosis not present

## 2023-10-01 DIAGNOSIS — I998 Other disorder of circulatory system: Secondary | ICD-10-CM | POA: Diagnosis not present

## 2023-10-01 DIAGNOSIS — R000 Tachycardia, unspecified: Secondary | ICD-10-CM | POA: Diagnosis not present

## 2023-10-01 DIAGNOSIS — Z89612 Acquired absence of left leg above knee: Secondary | ICD-10-CM | POA: Diagnosis not present

## 2023-10-01 DIAGNOSIS — N189 Chronic kidney disease, unspecified: Secondary | ICD-10-CM | POA: Diagnosis not present

## 2023-10-01 DIAGNOSIS — I480 Paroxysmal atrial fibrillation: Secondary | ICD-10-CM | POA: Diagnosis not present

## 2023-10-01 DIAGNOSIS — L97519 Non-pressure chronic ulcer of other part of right foot with unspecified severity: Secondary | ICD-10-CM | POA: Diagnosis not present

## 2023-10-01 DIAGNOSIS — I739 Peripheral vascular disease, unspecified: Secondary | ICD-10-CM | POA: Diagnosis not present

## 2023-10-01 DIAGNOSIS — Z79899 Other long term (current) drug therapy: Secondary | ICD-10-CM | POA: Diagnosis not present

## 2023-10-01 DIAGNOSIS — D649 Anemia, unspecified: Secondary | ICD-10-CM | POA: Diagnosis not present

## 2023-10-01 DIAGNOSIS — I70235 Atherosclerosis of native arteries of right leg with ulceration of other part of foot: Secondary | ICD-10-CM | POA: Diagnosis not present

## 2023-10-01 DIAGNOSIS — N261 Atrophy of kidney (terminal): Secondary | ICD-10-CM | POA: Diagnosis not present

## 2023-10-01 DIAGNOSIS — N179 Acute kidney failure, unspecified: Secondary | ICD-10-CM | POA: Diagnosis not present

## 2023-10-01 DIAGNOSIS — M19071 Primary osteoarthritis, right ankle and foot: Secondary | ICD-10-CM | POA: Diagnosis not present

## 2023-10-01 DIAGNOSIS — I723 Aneurysm of iliac artery: Secondary | ICD-10-CM | POA: Diagnosis not present

## 2023-10-01 DIAGNOSIS — I2510 Atherosclerotic heart disease of native coronary artery without angina pectoris: Secondary | ICD-10-CM | POA: Diagnosis not present

## 2023-10-01 DIAGNOSIS — I4510 Unspecified right bundle-branch block: Secondary | ICD-10-CM | POA: Diagnosis not present

## 2023-10-01 DIAGNOSIS — E785 Hyperlipidemia, unspecified: Secondary | ICD-10-CM | POA: Diagnosis not present

## 2023-10-01 DIAGNOSIS — I120 Hypertensive chronic kidney disease with stage 5 chronic kidney disease or end stage renal disease: Secondary | ICD-10-CM | POA: Diagnosis not present

## 2023-10-01 DIAGNOSIS — A09 Infectious gastroenteritis and colitis, unspecified: Secondary | ICD-10-CM | POA: Diagnosis not present

## 2023-10-01 DIAGNOSIS — Z89431 Acquired absence of right foot: Secondary | ICD-10-CM | POA: Diagnosis not present

## 2023-10-01 DIAGNOSIS — I96 Gangrene, not elsewhere classified: Secondary | ICD-10-CM | POA: Diagnosis not present

## 2023-10-01 DIAGNOSIS — I70261 Atherosclerosis of native arteries of extremities with gangrene, right leg: Secondary | ICD-10-CM | POA: Diagnosis not present

## 2023-10-01 DIAGNOSIS — I70262 Atherosclerosis of native arteries of extremities with gangrene, left leg: Secondary | ICD-10-CM | POA: Diagnosis not present

## 2023-10-02 NOTE — Discharge Summary (Signed)
 ------------------------------------------------------------------------------- Attestation signed by Elston Godwin Gull, MD at 10/12/2023 10:39 PM Principal Problem (Resolved):   Acute lower limb ischemia Active Problems:   Paroxysmal atrial fibrillation    (CMD)   Renal transplant recipient (CMD)   Decreased independence with transfers   History of transmetatarsal amputation of foot    (CMD)   Metabolic acidosis, normal anion gap (NAG) Resolved Problems:   Critical limb ischemia of right lower extremity with gangrene    (CMD)  I saw and evaluated the patient. Discussed with resident and agree with resident's findings and plan as documented in the resident's note.     Final Discharge Diagnoses:  As above.   Elston Godwin Gull, MD, MHA [facility] Division of [facility] Medicine    The discharge took greater than 30 minutes  -------------------------------------------------------------------------------  General Medicine [place] 2 Discharge Summary   Name: [name] [facility] MRN: [id number] Age: 83 yrs DOB: 1941-03-27  Admit date: 10/01/2023 Discharge date: 10/11/23   Admitting Physician: Venkata Praveen Kumar Kondreddi, MD Discharge Physician: Elston Gull, MD  Admission Diagnoses:  ESRD (end stage renal disease)    (CMD) [N18.6] Acute lower limb ischemia [I99.8]   Discharge Diagnoses:  Acute on chronic right lower extremity ischemia Chronic PAD  Admission Condition: poor Discharged Condition: fair  Hospital Course:  For full details, please see H&P, progress notes, consult notes and ancillary notes. Briefly, [name] is a 83 y.o. male with pmh of hypertension, paroxysmal A-fib(not on anticoag), ESRD s/p  kidney transplant 2013, and peripheral vascular disease with left lower extremity AKA(12/2022) presenting for 1 month of right foot pain/discoloration/ and smell. Hospital course will be addressed in a problem based format as below.  # Acute on  chronic RLE ischemia with infected vascular ulcer # Right TMA Patient presented with 1 month history of worsening pain and discoloration of right lower extremity without purulence.  Patient was started on vancomycin and Zosyn.  History of left AKA due to PAD.  CTA abdominal aorta with runoff showed occlusion of the left superficial femoral artery near the origin, moderate and possibly severe narrowing of the distal right superficial femoral artery.  It also showed moderate narrowing of the right popliteal artery.  He was initiated on heparin for concern for acute limb ischemia.  Vascular surgery was consulted, s/p 7/2 RLE angiogram with angioplasty of SFA with Dr. Nicholaus.  Heparin was discontinued and he was continued on aspirin  and statin.  Vascular surgery recommended AKA due to concern for wound healing warm distally, however patient elected for right TMA with podiatry in effort for limb salvation, performed 7/5.  Surgical cultures grew Morganella morganii.  ID was consulted, who recommended IV ceftriaxone with transition to cefdinir 300 mg twice daily on discharge for 3 days to complete a total of 7 days postop.  He remains at high risk for impaired wound healing necessitating more proximal amputation.  PT/OT evaluated and recommended acute rehab facility  # ESRD s/p renal transplant 2013 not on HD # Non-anion gap metabolic acidosis Patient with a history of renal transplant, unclear whether patient has been taking immunosuppressive medications. Outpatient tacrolimus  goal was 3-5.  Nephrology was consulted and recommended continuing tacrolimus  and holding mycophenolate  until infection is resolved.   Patient also had anion gap metabolic acidosis likely secondary to medication noncompliance in the setting of chronic renal dysfunction that has been progressive and likely contributed to episodes of diarrhea prior to admission.  Sodium bicarbonate  was restarted and increased during  this admission.  #  Paroxysmal atrial fibrillation Patient has not been taking DOAC due to left AKA previously.  CHA2DS2-VASc score of 4.  Patient was discharged on Eliquis 2.5 twice daily with metoprolol succinate 25 mg daily for rate control.   On day of discharge, patient is clinically stable with no new examination findings or acute symptoms compared to prior.  The patient was seen by the attending physician on the date of discharge and deemed stable and acceptable for discharge.  The patient's chronic medical conditions were treated accordingly per the patient's home medication regimen.  The patient's medication reconciliation, follow-up appointments, discharge orders, instructions, and significant lab and diagnostic studies are as noted.   Discharge Follow-up Action Items: Follow up with PCP in 1-2 weeks Referral to nephrology to reestablish care Referral to vascular surgery Podiatry follow-up with Dr. Lesavage in 1 week  Medication Changes: Cefdinir 300 mg twice daily to complete 7-day course (EOT 7/13) Hold mycophenolate .  Resume after completion of antibiotics (7/14)  Wound Care Instructions: Leave right foot dressing clean, dry, and intact until 1 week follow-up with podiatry.  WBAT in surgical shoe   Patient's Ordered Code Status: Prior     Consults: IP CONSULT TO VASCULAR SURGERY WOUND OSTOMY EVAL AND TREAT IP CONSULT TO PODIATRY IP CONSULT TO INFECTIOUS DISEASES  Significant Diagnostic Studies:  CBC  Recent Labs    10/11/23 0504  WBC 8.23  RBC 2.86*  HGB 8.1*  HCT 24.8*  MCV 86.6  MCH 28.4  MCHC 32.7*  RDW 15.9  PLT 388  MPV 6.3*   BMP  Recent Labs    10/11/23 0505  NA 137  K 3.8  CL 104  CO2 27  BUN 31*  CREATININE 2.83*  CALCIUM  7.4*    CT Angio Abdomen Aorta Runoff  Final Result by Delmar Herbert Winfred Booker Results In [place] [id number] (07/01 2157)  CLINICAL DATA:  Limb ischemia, worsening foot wound referred by  podiatrist for worsening pain    EXAM:  CT ANGIOGRAPHY OF  ABDOMINAL AORTA WITH ILIOFEMORAL RUNOFF    TECHNIQUE:  Multidetector CT imaging of the abdomen, pelvis and lower  extremities was performed using the standard protocol during bolus  administration of intravenous contrast. Multiplanar CT image  reconstructions and MIPs were obtained to evaluate the vascular  anatomy.    RADIATION DOSE REDUCTION: This exam was performed according to the  departmental dose-optimization program which includes automated  exposure control, adjustment of the mA and/or kV according to  patient size and/or use of iterative reconstruction technique.    CONTRAST:  80 mL iohexoL (OMNIPAQUE) 350 mg iodine/mL injection  (MDV) 80 mL    COMPARISON:  CT abdomen pelvis 12/18/2022    FINDINGS:  VASCULAR    Aorta: Advanced calcified atherosclerotic plaque without  hemodynamically significant stenosis. No aneurysm or dissection.    Celiac/SMA: Normal variant common origin of the celiac axis and SMA.  Calcified plaque scattered throughout both arterial distributions  causes multifocal areas of up to moderate narrowing.    Renals: Calcified plaque throughout both renal arteries causes at  least moderate and likely severe narrowing bilaterally.    IMA: Patent.    RIGHT Lower Extremity    Inflow: Scattered calcified atherosclerotic plaque causes multifocal  areas of mild narrowing. No severe narrowing or occlusion. No  aneurysm or dissection.    Outflow: Circumferential calcification limits evaluation. The common  femoral, profundus femoral, and proximal superficial femoral  arteries are patent. At least moderate and possibly  severe narrowing  of the distal superficial femoral artery. Moderate narrowing of the  popliteal artery.    Runoff: Extensive circumferential calcification limits assessment.    LEFT Lower Extremity    Inflow: 2.4 cm partially thrombosed left common iliac artery  aneurysm. No severe narrowing or occlusion of the common, internal,  or  external iliac arteries.    Outflow: Patent common and profunda femoral arteries. The  superficial femoral artery is occluded near the origin.    Runoff: Left above the knee amputation.    Veins: No obvious venous abnormality within the limitations of this  arterial phase study.    Review of the MIP images confirms the above findings.    NON-VASCULAR    Lower chest: No acute abnormality.    Hepatobiliary: Cholecystectomy.  No acute abnormality.    Pancreas: Unremarkable.    Spleen: Unremarkable.    Adrenals/Urinary Tract: Normal adrenal glands. Atrophic kidneys  bilaterally. Both kidneys contain low-density cysts some of which  are too small to definitively characterize. These likely represent  benign cysts. No follow-up is recommended. Right lower quadrant  renal transplant. Unremarkable bladder.    Stomach/Bowel: Stomach is within normal limits. Normal caliber large  and small bowel. No bowel wall thickening. Normal appendix.    Lymphatic: No lymphadenopathy.    Reproductive: Prostate is unremarkable.  Small right hydrocele.    Other: No free intraperitoneal fluid or air. No abscess.  Postoperative change of ventral abdominal wall.    Musculoskeletal: No acute fracture.    IMPRESSION:  1. Occlusion of the left superficial femoral artery near the origin.  Left above the knee amputation.  2. At least moderate and possibly severe narrowing of the distal  right superficial femoral artery. Moderate narrowing of the right  popliteal artery. The runoff vessels are not well assessed due to  circumferential calcification.  3. 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  4. Atrophic native kidneys with right lower quadrant renal  transplant.  5. Aortic Atherosclerosis (ICD10-I70.0).      Electronically Signed    By: Norman Gatlin M.D.    On: 10/01/2023 21:57      XR Foot Minimum 3 Views Right  Final Result by Delmar Herbert Winfred Booker Results In [place]  [id number] (07/01 1713)  CLINICAL DATA:  Worsening right foot wound.    EXAM:  RIGHT FOOT COMPLETE - 3+ VIEW    COMPARISON:  None Available.    FINDINGS:  The bones are diffusely demineralized. No evidence of acute  fracture, dislocation or definite bone destruction. There are  moderate degenerative changes at the Lisfranc joint and 1st  metatarsal-phalangeal joint. Extensive vascular calcifications  consistent with underlying diabetes. No focal soft tissue ulceration  or foreign body identified.    IMPRESSION:  No acute osseous findings or definite radiographic evidence of  osteomyelitis. Degenerative changes as described.      Electronically Signed    By: Elsie Perone M.D.    On: 10/01/2023 17:13         Disposition: Rehab facility  Patient Instructions:    Medication List     PAUSE taking these medications    * mycophenolate  180 mg Tbec DR tablet Wait to take this until your doctor or other care provider tells you to start again. Commonly known as: MYFORTIC  Take 2 tablets (360 mg total) by mouth 2 (two) times a day. You also have another medication with the same name that you may need to continue taking. What  changed:  how much to take when to take this      * * This list has 1 medication(s) that are the same as other medications prescribed for you. Read the directions carefully, and ask your doctor or other care provider to review them with you.          START taking these medications    cefdinir 300 mg capsule Commonly known as: OMNICEF Take 1 capsule (300 mg total) by mouth daily for 2 doses. Start taking on: October 12, 2023   Eliquis 2.5 mg Tab Generic drug: apixaban Take 1 tablet (2.5 mg total) by mouth 2 (two) times a day.   gabapentin 300 mg capsule Commonly known as: NEURONTIN Take 1 capsule (300 mg total) by mouth 2 (two) times a day.   oxyCODONE 5 mg immediate release tablet Commonly known as: ROXICODONE Take 1 tablet (5 mg total) by  mouth every 6 (six) hours as needed for severe pain. (Take 1 tablet (5 mg total) by mouth every 6 (six) hours as needed for severe pain (7-10).)   Pain Reliever (acetaminophen ) 500 mg tablet Generic drug: acetaminophen  Take 2 tablets (1,000 mg total) by mouth every 8 (eight) hours as needed for moderate or mild pain. (Take 2 tablets (1,000 mg total) by mouth every 8 (eight) hours as needed for moderate pain (4-6) or mild pain (1-3).)       CHANGE how you take these medications    amLODIPine  5 mg tablet Commonly known as: NORVASC  Take 1 tablet by mouth daily. (1 tab(s) orally once a day 90 days) What changed: Another medication with the same name was removed. Continue taking this medication, and follow the directions you see here.   magnesium  oxide 400 mg (241 mg magnesium ) Tab Take 1 tablet by mouth 2 times daily. What changed: Another medication with the same name was removed. Continue taking this medication, and follow the directions you see here.   * mycophenolate  180 mg Tbec DR tablet Commonly known as: MYFORTIC  take 2 tablets by mouth twice daily What changed:  when to take this additional instructions   predniSONE  5 mg tablet Commonly known as: DELTASONE  Take 1 tablet by mouth once daily What changed: Another medication with the same name was removed. Continue taking this medication, and follow the directions you see here.   sodium bicarbonate  650 mg tablet Take 1 tablet (650 mg total) by mouth 2 (two) times a day. What changed: Another medication with the same name was removed. Continue taking this medication, and follow the directions you see here.   tacrolimus  1 mg capsule Commonly known as: PROGRAF  Take 4 capsules by mouth 2 times daily. (Take 4 tabs twice daily Z94.0) What changed: Another medication with the same name was removed. Continue taking this medication, and follow the directions you see here.      * * This list has 1 medication(s) that are the same  as other medications prescribed for you. Read the directions carefully, and ask your doctor or other care provider to review them with you.          CONTINUE taking these medications    Artificial Tears (cmc) 1 % Drop Generic drug: carboxymethylcellulose sodium Administer 1 drop into affected eye(s) 2 (two) times a day.   aspirin  81 mg chewable tablet Chew 81 mg daily.   atorvastatin 10 mg tablet Commonly known as: LIPITOR Take 1 tablet (10 mg total) by mouth at bedtime.   calcitrioL  0.5 mcg capsule  Commonly known as: ROCALTROL  TAKE 1 CAPSULE BY MOUTH ONCE A DAY   cinacalcet 30 mg tablet Commonly known as: SENSIPAR Take 30 mg by mouth 2 (two) times a day.   metoprolol succinate 25 mg 24 hr tablet Commonly known as: TOPROL XL Take 1 tablet (25 mg total) by mouth daily.   sodium zirconium cyclosilicate 10 g Pwpk packet Commonly known as: LOKELMA Take 10 g by mouth every Monday, Wednesday and Friday.         Where to Get Your Medications     These medications were sent to [facility]  [address] [place] [address]    Hours: Mon-Fri 8:30am-5pm; Sat-Sun: Closed; Holidays: Closed Phone: [phone]  cefdinir 300 mg capsule Eliquis 2.5 mg Tab gabapentin 300 mg capsule mycophenolate  180 mg Tbec DR tablet oxyCODONE 5 mg immediate release tablet Pain Reliever (acetaminophen ) 500 mg tablet      Discharge Orders     Activity Instructions:     Details:    Activity Limits: As you are able   CNA Home Health Coordination     Details:    CNA Actions: Assist with ADL's   Durable Medical Equipment - Other     Details:    Height (in inches): 1.676 m (5' 6)   Weight (in lbs.): 56.6 kg (124 lb 12.8 oz)   Length of need: Lifetime   Comments: Slide board for transfers   Home Health Skilled Nursing     Details:    Home Health Skilled Nursing Service: Medication   Medication Education: Observation and Assessment with Medication Education  and Mgmt   Occupational Therapy Home Health Coordination     Details:    Actions: Resume Home Health   Physical Therapy Home Health Coordination     Details:    Actions: Resume Home Health   Walker     Details:    Height (in inches): 1.676 m (5' 6)   Weight (in lbs.): 56.6 kg (124 lb 12.8 oz)   Walker Options: Rolling   Walker Assessories: 5 inch wheels   Length of need: Lifetime   Wheelchair     Details:    Height (in inches): 1.676 m (5' 6)   Weight (in lbs.): 56.6 kg (124 lb 12.8 oz)   Wheelchair: Standard   Wheelchair Options: Elevated Leg Rests   Right, Left, Bilateral: Bilateral   Seat Width: 16   Cushion Type: Jay Basic (foam)   Length of need: Lifetime   Ambulatory referral to PCP     Comments: Hosp f/u   Ambulatory referral to Nephrology     Comments: Hosp f/u, establish outpt care, pt ESRD s/p kidney transplant       Follow-up  Future Appointments  Date Time Provider Department Center  10/16/2023 10:40 AM Manuelita Crank [place], DPM [facility] PODI [facility] [place]  11/05/2023  8:45 AM [facility] 04 HC VASCULAR 2 [facility] VSUR [facility] [address]  11/05/2023 10:00 AM Isaiah Riggs Myra RIGGERS [facility] VSUR [facility] [address] [facility]  03/16/2024  9:45 AM Alverna Lamar [place], DO [facility] CAR HP [address]    Electronically signed by: Joshua Guinea-Bissau, MD, Resident, 10/11/2023 9:13 PM   It was a pleasure participating in your care.  Team Members:  Attending: Adia K. Ross MD, MHA Resident(s): Maude Hila  You were admitted to the hospital for:  Acute on chronic right lower extremity ischemia Chronic PAD  Your medications were changed during your admission.  The  following medications were changed:  Cefdinir 300 mg twice daily to complete 7-day course (EOT 7/13) Hold mycophenolate .  Resume after completion of antibiotics (7/14)  It is important that you see your primary care provider within 7 days of discharge. Also make sure to follow up with vascular surgery and podiatry within 1 week of  discharge. You will need to follow up with nephrology as well, within 4-6 weeks.   Should you experience symptoms similar to what led to you coming to the hospital you should contact your primary care provider immediately or return to the nearest ER.  Should you have questions regarding your discharge prior to your appointment (and within one week of discharge), please contact [phone] and ask to speak to have Dr. Elston Gull paged or after hours the hospitalist on-call.   *Some images could not be shown.

## 2023-10-03 NOTE — Unmapped External Note (Signed)
-------------------------------------------------------------------------------   Attestation signed by Beauford Lennox, MD at 10/03/2023  9:49 PM Agreed.  -------------------------------------------------------------------------------        [place] Medicaid Long Term Care [facility] [place] Form   Prior Approval   IDENTIFICATION  Patient Name: [name] Birthdate: [birth year]  SSN:  [id number]  Sex: male Admission Date (Current Location): 10/01/2023  [facility] and [place] Number:    Facility and Address:  [facility] [facility] [facility] -  [address] [place] [address]  Provider Number: [id number]  Attending Physician Name:   Beauford Lennox, MD Address:  [address] [place] [address] Relative Name and Address:  Extended Emergency Contact Information Primary Emergency Contact: Schappell,Rebecca Address: [address]          [place], [place] [address] [place] Home Phone: [phone] Work Phone: [phone] Mobile Phone: [phone] Relation: Spouse  Current Level of Care: Hospital Recommended Level of Care: Skilled Nursing Facility Prior Approval Number:    Date Approved/Denied:   PASRR Number: [id number]  Discharge Plan: Skilled Nursing Facility    Current Diagnoses: Principal Problem:   Acute lower limb ischemia Active Problems:   Critical limb ischemia of right lower extremity with gangrene    (CMD)   Decreased independence with transfers Resolved Problems:   * No resolved hospital problems. *    DISORIENTED AMBULATORY STATUS BLADDER BOWEL    Semi-Ambulatory (Fall Precautions) Continent Continent  INAPPROPRIATE BEHAVIOR FUNCTIONAL LIMITATIONS COMMUNICATION OF NEEDS RESPIRATION    Sight Verbally Normal  PERSONAL CARE ASSISTANCE ACTIVITIES/SOCIAL SKIN NUTRITION STATUS  Bathing, Dressing, Feeding Active, Family Supportive Other (comment), Decubiti (comment)  (Wound- Left lower buttocks pressure injury. Sacrum, Right heel) Diet (Renal dialysis (K/Phos/Na) diet) Height: 1.676 m (5' 6) (10/01/2023  4:42 PM) Weight: 61.6 kg (135 lb 12.9 oz) (10/02/2023  6:00 AM)  PHYSICIAN VISITS NEUROLOGICAL    30 days          SPECIAL CARE FACTORS FREQUENCY  PT (By Licensed PT), Occupational Therapy                      MEDICATIONS: Scheduled Meds:[Held by provider] amLODIPine , 5 mg, oral, Daily artificial tears, 1 drop, Both Eyes, BID aspirin , 81 mg, oral, Daily atorvastatin, 10 mg, oral, At Bedtime calcitrioL , 0.5 mcg, oral, Daily cinacalcet, 30 mg, oral, BID heparin (porcine), 5,000 Units, subcutaneous, Q8H SCH magnesium  oxide, 400 mg, oral, Daily metoprolol succinate, 25 mg, oral, Daily [Held by provider] mycophenolate , 360 mg, oral, BID piperacillin-tazobactam, 2.25 g, intravenous, Q8H predniSONE , 5 mg, oral, Daily sodium zirconium cyclosilicate, 10 g, oral, QMWF tacrolimus , 4 mg, oral, Q12H Pharmacy to Dose Vancomycin, , miscellaneous, Pharmacy to Manage   Continuous Infusions:sodium bicarbonate , 75 mL/hr, Last Rate: 75 mL/hr (10/03/23 1106)   PRN Meds:..  acetaminophen  .  dextrose  .  dextrose  .  melatonin Do not use this list as official medication orders. Please verify with discharge summary.  X-ray and Laboratory Findings/Date: See Clinical Information for details     ADDITIONAL INFORMATION:  DNAR / Total SOTO  Aspiration Precautions    Bascom Blondie Roulette, CMA

## 2023-10-11 NOTE — Progress Notes (Signed)
 Case Management Discharge Note        CSN: [id number] DOB: 1940-04-27 Service: General Medicine Location: 679/01  Patient Class: Inpatient  DC Disposition: : Home Health Care  PT is active with [facility] for HHPT/OT RN WC and slide board from [employer]  Discharge Referrals Chosen geographical local area/county list shared with patient/family: Return/previous involvement Date chosen geographical local area/county list shared with patient/family: 10/03/23 Post-Acute Provider Form Completed: Return/Previous Involvement Case closed, patient/family agree with disposition plan: Yes     Durable Medical Equipment Coordination Status: Coordination complete.        [name], MSW

## 2023-10-11 NOTE — Progress Notes (Addendum)
 Case Management Update  Date: 10/11/2023   Time: 8:52 AM   Patient Type: Inpatient  PT's wife declined [facility] lat night after she accepted the bed and pt was being discharged by [facility] to the facility yesterday.  Pt is now going home with [facility] for HHPT/OT/SN.  CM called Garrel Snuffer [phone] to make him aware. [employer] delivered new wheelchair to the room and will deliver transfer slide board to the home.  Patient suffers from gout, GERD, hypertension, aortic stenosis, a-fib, iron deficiency anemia, ESRD s/p kidney transplant and peripheral vascular disease with left lower extremity and AKA  which impairs their ability to perform daily activities such as toileting, bathing, feeding, dressing, grooming, meals, and laundry in the home.  A cane, walker, or crutch will not resolve the issue with performing activities of daily living.  A wheelchair will allow the patient to safely perform daily activities.  The patient can safely propel the wheelchair in the home or has a caregiver who can provide assistance.      Durable Medical Equipment Coordination Status: Referral sent and pending status update.  Post Acute Placement Status: Coordination complete.      Anticipated Discharge Location: Home with Home Health  If Plan A discharging location is not feasible: Potential Plan B: Home with Home Health         Olam DASEN May, MSW

## 2023-10-14 ENCOUNTER — Telehealth: Payer: Self-pay

## 2023-10-14 NOTE — Transitions of Care (Post Inpatient/ED Visit) (Signed)
   10/14/2023  Name: [name] MRN: [id number] DOB: 02-11-1941  Today's TOC FU Call Status: Today's TOC FU Call Status:: Successful TOC FU Call Completed TOC FU Call Complete Date: 10/14/23 Patient's Name and Date of Birth confirmed.  Transition Care Management Follow-up Telephone Call Date of Discharge: 10/11/23 Discharge Facility: Other [facility]) Name of Other (Non-[facility]) Discharge Facility: [facility] Type of Discharge: Inpatient Admission Primary Inpatient Discharge Diagnosis:: Acute on chronic right lower extremity ischemia How have you been since you were released from the hospital?: Better Any questions or concerns?: No  Items Reviewed: Did you receive and understand the discharge instructions provided?: Yes Medications obtained,verified, and reconciled?: No (patient unable to review medications at this time) Any new allergies since your discharge?: No Dietary orders reviewed?: Yes Type of Diet Ordered:: Heart Healthy Do you have support at home?: Yes People in Home [RPT]: spouse Name of Support/Comfort Primary Source: [name]  Medications Reviewed Today: Medications Reviewed Today   Medications were not reviewed in this encounter     Home Care and Equipment/Supplies: Were Home Health Services Ordered?: Yes Name of Home Health Agency:: [facility] Has Agency set up a time to come to your home?: Yes First Home Health Visit Date: 10/14/23 Any new equipment or medical supplies ordered?: Yes Name of Medical supply agency?: [facility] Were you able to get the equipment/medical supplies?: Yes Do you have any questions related to the use of the equipment/supplies?: No  Functional Questionnaire: Do you need assistance with bathing/showering or dressing?: Yes (wife assists) Do you need assistance with meal preparation?: Yes (wife assists) Do you need assistance with eating?: No Do you have difficulty maintaining  continence: No Do you need assistance with getting out of bed/getting out of a chair/moving?: Yes (wife assists) Do you have difficulty managing or taking your medications?: Yes (wife assists)  Follow up appointments reviewed: PCP Follow-up appointment confirmed?: No MD Provider Line Number:[phone] Given: No (patient to call) Specialist Hospital Follow-up appointment confirmed?: Yes Date of Specialist follow-up appointment?: 10/16/23 Follow-Up Specialty Provider:: Dr. Lesavage Do you need transportation to your follow-up appointment?: No Do you understand care options if your condition(s) worsen?: Yes-patient verbalized understanding (Leave right foot dressing clean, dry, and intact until 1 week follow-up with podiatry. WBAT in surgical shoe.  Monitor for swelling, redness, pain, pus, and fever  Keep wound clean and dry.    Notify physician immediately for any changes)  SDOH Interventions Today    Flowsheet Row Most Recent Value  SDOH Interventions   Food Insecurity Interventions Intervention Not Indicated  Housing Interventions Intervention Not Indicated  Transportation Interventions Intervention Not Indicated  Utilities Interventions Intervention Not Indicated    Juliauna Stueve J. Alizeh Madril RN, MSN [facility]  [facility], [facility] RN Care Manager Direct Dial: [phone]  Fax: [phone] Website: [email or web address]

## 2023-10-14 NOTE — Patient Instructions (Signed)
 Visit Information  Thank you for taking time to visit with me today. Please don't hesitate to contact me if I can be of assistance to you.   Leave right foot dressing clean, dry, and intact until 1 week follow-up with podiatry. WBAT in surgical shoe. Monitor for swelling, redness, pain, pus, and fever Keep wound clean and dry.   Notify physician immediately for any changes     If you are experiencing a Mental Health or Behavioral Health Crisis or need someone to talk to, please call the Suicide and Crisis Lifeline: 988   The patient verbalized understanding of instructions, educational materials, and care plan provided today and DECLINED offer to receive copy of patient instructions, educational materials, and care plan.   The patient has been provided with contact information for the care management team and has been advised to call with any health related questions or concerns.   Nakiyah Beverley J. Glendene Wyer RN, MSN [facility], [facility] RN Care Manager Direct Dial: [phone]  Fax: [phone] Website: [email or web address]

## 2023-10-17 DIAGNOSIS — Z89431 Acquired absence of right foot: Secondary | ICD-10-CM | POA: Diagnosis not present

## 2023-10-17 DIAGNOSIS — I10 Essential (primary) hypertension: Secondary | ICD-10-CM | POA: Diagnosis not present

## 2023-10-17 DIAGNOSIS — I739 Peripheral vascular disease, unspecified: Secondary | ICD-10-CM | POA: Diagnosis not present

## 2023-10-17 DIAGNOSIS — E538 Deficiency of other specified B group vitamins: Secondary | ICD-10-CM | POA: Diagnosis not present

## 2023-10-17 DIAGNOSIS — E559 Vitamin D deficiency, unspecified: Secondary | ICD-10-CM | POA: Diagnosis not present

## 2023-10-17 DIAGNOSIS — Z89612 Acquired absence of left leg above knee: Secondary | ICD-10-CM | POA: Diagnosis not present

## 2023-10-17 DIAGNOSIS — R7303 Prediabetes: Secondary | ICD-10-CM | POA: Diagnosis not present

## 2023-10-17 DIAGNOSIS — E7800 Pure hypercholesterolemia, unspecified: Secondary | ICD-10-CM | POA: Diagnosis not present

## 2023-10-17 DIAGNOSIS — M109 Gout, unspecified: Secondary | ICD-10-CM | POA: Diagnosis not present

## 2023-10-17 DIAGNOSIS — R262 Difficulty in walking, not elsewhere classified: Secondary | ICD-10-CM | POA: Diagnosis not present

## 2023-10-17 DIAGNOSIS — Z0001 Encounter for general adult medical examination with abnormal findings: Secondary | ICD-10-CM | POA: Diagnosis not present

## 2023-10-23 DIAGNOSIS — I739 Peripheral vascular disease, unspecified: Secondary | ICD-10-CM | POA: Diagnosis not present

## 2023-10-23 DIAGNOSIS — Z89439 Acquired absence of unspecified foot: Secondary | ICD-10-CM | POA: Diagnosis not present

## 2023-10-23 DIAGNOSIS — T8781 Dehiscence of amputation stump: Secondary | ICD-10-CM | POA: Diagnosis not present

## 2023-11-06 DIAGNOSIS — Z89612 Acquired absence of left leg above knee: Secondary | ICD-10-CM | POA: Diagnosis not present

## 2023-11-06 DIAGNOSIS — Z89439 Acquired absence of unspecified foot: Secondary | ICD-10-CM | POA: Diagnosis not present

## 2023-11-06 DIAGNOSIS — Z89431 Acquired absence of right foot: Secondary | ICD-10-CM | POA: Diagnosis not present

## 2023-11-06 DIAGNOSIS — I959 Hypotension, unspecified: Secondary | ICD-10-CM | POA: Diagnosis not present

## 2023-11-06 DIAGNOSIS — N189 Chronic kidney disease, unspecified: Secondary | ICD-10-CM | POA: Diagnosis not present

## 2023-11-06 DIAGNOSIS — Z4781 Encounter for orthopedic aftercare following surgical amputation: Secondary | ICD-10-CM | POA: Diagnosis not present

## 2023-11-06 DIAGNOSIS — N186 End stage renal disease: Secondary | ICD-10-CM | POA: Diagnosis not present

## 2023-11-06 DIAGNOSIS — G8918 Other acute postprocedural pain: Secondary | ICD-10-CM | POA: Diagnosis not present

## 2023-11-06 DIAGNOSIS — T8753 Necrosis of amputation stump, right lower extremity: Secondary | ICD-10-CM | POA: Diagnosis not present

## 2023-11-06 DIAGNOSIS — E1152 Type 2 diabetes mellitus with diabetic peripheral angiopathy with gangrene: Secondary | ICD-10-CM | POA: Diagnosis not present

## 2023-11-06 DIAGNOSIS — Z7409 Other reduced mobility: Secondary | ICD-10-CM | POA: Diagnosis not present

## 2023-11-06 DIAGNOSIS — I10 Essential (primary) hypertension: Secondary | ICD-10-CM | POA: Diagnosis not present

## 2023-11-06 DIAGNOSIS — R2689 Other abnormalities of gait and mobility: Secondary | ICD-10-CM | POA: Diagnosis not present

## 2023-11-06 DIAGNOSIS — N179 Acute kidney failure, unspecified: Secondary | ICD-10-CM | POA: Diagnosis not present

## 2023-11-06 DIAGNOSIS — M86161 Other acute osteomyelitis, right tibia and fibula: Secondary | ICD-10-CM | POA: Diagnosis not present

## 2023-11-06 DIAGNOSIS — D638 Anemia in other chronic diseases classified elsewhere: Secondary | ICD-10-CM | POA: Diagnosis not present

## 2023-11-06 DIAGNOSIS — N184 Chronic kidney disease, stage 4 (severe): Secondary | ICD-10-CM | POA: Diagnosis not present

## 2023-11-06 DIAGNOSIS — M7989 Other specified soft tissue disorders: Secondary | ICD-10-CM | POA: Diagnosis not present

## 2023-11-06 DIAGNOSIS — Z79624 Long term (current) use of inhibitors of nucleotide synthesis: Secondary | ICD-10-CM | POA: Diagnosis not present

## 2023-11-06 DIAGNOSIS — I2510 Atherosclerotic heart disease of native coronary artery without angina pectoris: Secondary | ICD-10-CM | POA: Diagnosis not present

## 2023-11-06 DIAGNOSIS — E875 Hyperkalemia: Secondary | ICD-10-CM | POA: Diagnosis not present

## 2023-11-06 DIAGNOSIS — M6281 Muscle weakness (generalized): Secondary | ICD-10-CM | POA: Diagnosis not present

## 2023-11-06 DIAGNOSIS — I272 Pulmonary hypertension, unspecified: Secondary | ICD-10-CM | POA: Diagnosis not present

## 2023-11-06 DIAGNOSIS — I9789 Other postprocedural complications and disorders of the circulatory system, not elsewhere classified: Secondary | ICD-10-CM | POA: Diagnosis not present

## 2023-11-06 DIAGNOSIS — D631 Anemia in chronic kidney disease: Secondary | ICD-10-CM | POA: Diagnosis not present

## 2023-11-06 DIAGNOSIS — I739 Peripheral vascular disease, unspecified: Secondary | ICD-10-CM | POA: Diagnosis not present

## 2023-11-06 DIAGNOSIS — I129 Hypertensive chronic kidney disease with stage 1 through stage 4 chronic kidney disease, or unspecified chronic kidney disease: Secondary | ICD-10-CM | POA: Diagnosis not present

## 2023-11-06 DIAGNOSIS — M868X6 Other osteomyelitis, lower leg: Secondary | ICD-10-CM | POA: Diagnosis not present

## 2023-11-06 DIAGNOSIS — R531 Weakness: Secondary | ICD-10-CM | POA: Diagnosis not present

## 2023-11-06 DIAGNOSIS — M86171 Other acute osteomyelitis, right ankle and foot: Secondary | ICD-10-CM | POA: Diagnosis not present

## 2023-11-06 DIAGNOSIS — T8131XA Disruption of external operation (surgical) wound, not elsewhere classified, initial encounter: Secondary | ICD-10-CM | POA: Diagnosis not present

## 2023-11-06 DIAGNOSIS — L89893 Pressure ulcer of other site, stage 3: Secondary | ICD-10-CM | POA: Diagnosis not present

## 2023-11-06 DIAGNOSIS — R600 Localized edema: Secondary | ICD-10-CM | POA: Diagnosis not present

## 2023-11-06 DIAGNOSIS — Z89421 Acquired absence of other right toe(s): Secondary | ICD-10-CM | POA: Diagnosis not present

## 2023-11-06 DIAGNOSIS — Z743 Need for continuous supervision: Secondary | ICD-10-CM | POA: Diagnosis not present

## 2023-11-06 DIAGNOSIS — M19071 Primary osteoarthritis, right ankle and foot: Secondary | ICD-10-CM | POA: Diagnosis not present

## 2023-11-06 DIAGNOSIS — Z89511 Acquired absence of right leg below knee: Secondary | ICD-10-CM | POA: Diagnosis not present

## 2023-11-06 DIAGNOSIS — E1122 Type 2 diabetes mellitus with diabetic chronic kidney disease: Secondary | ICD-10-CM | POA: Diagnosis not present

## 2023-11-06 DIAGNOSIS — E1169 Type 2 diabetes mellitus with other specified complication: Secondary | ICD-10-CM | POA: Diagnosis not present

## 2023-11-06 DIAGNOSIS — Z940 Kidney transplant status: Secondary | ICD-10-CM | POA: Diagnosis not present

## 2023-11-06 DIAGNOSIS — R1312 Dysphagia, oropharyngeal phase: Secondary | ICD-10-CM | POA: Diagnosis not present

## 2023-11-06 DIAGNOSIS — E8809 Other disorders of plasma-protein metabolism, not elsewhere classified: Secondary | ICD-10-CM | POA: Diagnosis not present

## 2023-11-06 DIAGNOSIS — T8789 Other complications of amputation stump: Secondary | ICD-10-CM | POA: Diagnosis not present

## 2023-11-06 DIAGNOSIS — I480 Paroxysmal atrial fibrillation: Secondary | ICD-10-CM | POA: Diagnosis not present

## 2023-11-06 DIAGNOSIS — Z789 Other specified health status: Secondary | ICD-10-CM | POA: Diagnosis not present

## 2023-11-06 DIAGNOSIS — Z7901 Long term (current) use of anticoagulants: Secondary | ICD-10-CM | POA: Diagnosis not present

## 2023-11-06 DIAGNOSIS — T8743 Infection of amputation stump, right lower extremity: Secondary | ICD-10-CM | POA: Diagnosis not present

## 2023-11-06 DIAGNOSIS — L89153 Pressure ulcer of sacral region, stage 3: Secondary | ICD-10-CM | POA: Diagnosis not present

## 2023-11-06 DIAGNOSIS — I080 Rheumatic disorders of both mitral and aortic valves: Secondary | ICD-10-CM | POA: Diagnosis not present

## 2023-11-06 DIAGNOSIS — D649 Anemia, unspecified: Secondary | ICD-10-CM | POA: Diagnosis not present

## 2023-11-06 DIAGNOSIS — Z79621 Long term (current) use of calcineurin inhibitor: Secondary | ICD-10-CM | POA: Diagnosis not present

## 2023-11-06 DIAGNOSIS — R41841 Cognitive communication deficit: Secondary | ICD-10-CM | POA: Diagnosis not present

## 2023-11-06 DIAGNOSIS — Z79899 Other long term (current) drug therapy: Secondary | ICD-10-CM | POA: Diagnosis not present

## 2023-11-06 DIAGNOSIS — N1832 Chronic kidney disease, stage 3b: Secondary | ICD-10-CM | POA: Diagnosis not present

## 2023-11-06 DIAGNOSIS — Z7952 Long term (current) use of systemic steroids: Secondary | ICD-10-CM | POA: Diagnosis not present

## 2023-11-06 DIAGNOSIS — Y835 Amputation of limb(s) as the cause of abnormal reaction of the patient, or of later complication, without mention of misadventure at the time of the procedure: Secondary | ICD-10-CM | POA: Diagnosis not present

## 2023-11-06 DIAGNOSIS — I70291 Other atherosclerosis of native arteries of extremities, right leg: Secondary | ICD-10-CM | POA: Diagnosis not present

## 2023-11-06 DIAGNOSIS — T8619 Other complication of kidney transplant: Secondary | ICD-10-CM | POA: Diagnosis not present

## 2023-11-06 DIAGNOSIS — E11649 Type 2 diabetes mellitus with hypoglycemia without coma: Secondary | ICD-10-CM | POA: Diagnosis not present

## 2023-11-06 DIAGNOSIS — I96 Gangrene, not elsewhere classified: Secondary | ICD-10-CM | POA: Diagnosis not present

## 2023-11-06 DIAGNOSIS — I70261 Atherosclerosis of native arteries of extremities with gangrene, right leg: Secondary | ICD-10-CM | POA: Diagnosis not present

## 2023-11-07 DIAGNOSIS — I96 Gangrene, not elsewhere classified: Secondary | ICD-10-CM | POA: Diagnosis not present

## 2023-11-08 DIAGNOSIS — N186 End stage renal disease: Secondary | ICD-10-CM | POA: Diagnosis not present

## 2023-11-08 DIAGNOSIS — I10 Essential (primary) hypertension: Secondary | ICD-10-CM | POA: Diagnosis not present

## 2023-11-08 DIAGNOSIS — I2510 Atherosclerotic heart disease of native coronary artery without angina pectoris: Secondary | ICD-10-CM | POA: Diagnosis not present

## 2023-11-08 DIAGNOSIS — I96 Gangrene, not elsewhere classified: Secondary | ICD-10-CM | POA: Diagnosis not present

## 2023-11-09 DIAGNOSIS — N1832 Chronic kidney disease, stage 3b: Secondary | ICD-10-CM | POA: Diagnosis not present

## 2023-11-09 DIAGNOSIS — N179 Acute kidney failure, unspecified: Secondary | ICD-10-CM | POA: Diagnosis not present

## 2023-11-09 DIAGNOSIS — Z940 Kidney transplant status: Secondary | ICD-10-CM | POA: Diagnosis not present

## 2023-11-09 DIAGNOSIS — D649 Anemia, unspecified: Secondary | ICD-10-CM | POA: Diagnosis not present

## 2023-11-09 DIAGNOSIS — I959 Hypotension, unspecified: Secondary | ICD-10-CM | POA: Diagnosis not present

## 2023-11-09 DIAGNOSIS — I96 Gangrene, not elsewhere classified: Secondary | ICD-10-CM | POA: Diagnosis not present

## 2023-11-10 DIAGNOSIS — N179 Acute kidney failure, unspecified: Secondary | ICD-10-CM | POA: Diagnosis not present

## 2023-11-10 DIAGNOSIS — N1832 Chronic kidney disease, stage 3b: Secondary | ICD-10-CM | POA: Diagnosis not present

## 2023-11-10 DIAGNOSIS — I959 Hypotension, unspecified: Secondary | ICD-10-CM | POA: Diagnosis not present

## 2023-11-10 DIAGNOSIS — I96 Gangrene, not elsewhere classified: Secondary | ICD-10-CM | POA: Diagnosis not present

## 2023-11-10 DIAGNOSIS — D649 Anemia, unspecified: Secondary | ICD-10-CM | POA: Diagnosis not present

## 2023-11-10 DIAGNOSIS — Z940 Kidney transplant status: Secondary | ICD-10-CM | POA: Diagnosis not present

## 2023-11-11 DIAGNOSIS — Z940 Kidney transplant status: Secondary | ICD-10-CM | POA: Diagnosis not present

## 2023-11-11 DIAGNOSIS — T8743 Infection of amputation stump, right lower extremity: Secondary | ICD-10-CM | POA: Diagnosis not present

## 2023-11-11 DIAGNOSIS — I70291 Other atherosclerosis of native arteries of extremities, right leg: Secondary | ICD-10-CM | POA: Diagnosis not present

## 2023-11-11 DIAGNOSIS — Z89511 Acquired absence of right leg below knee: Secondary | ICD-10-CM | POA: Diagnosis not present

## 2023-11-11 DIAGNOSIS — I96 Gangrene, not elsewhere classified: Secondary | ICD-10-CM | POA: Diagnosis not present

## 2023-11-11 DIAGNOSIS — I10 Essential (primary) hypertension: Secondary | ICD-10-CM | POA: Diagnosis not present

## 2023-11-11 DIAGNOSIS — T8789 Other complications of amputation stump: Secondary | ICD-10-CM | POA: Diagnosis not present

## 2023-11-11 DIAGNOSIS — T8753 Necrosis of amputation stump, right lower extremity: Secondary | ICD-10-CM | POA: Diagnosis not present

## 2023-11-11 DIAGNOSIS — G8918 Other acute postprocedural pain: Secondary | ICD-10-CM | POA: Diagnosis not present

## 2023-11-11 DIAGNOSIS — I739 Peripheral vascular disease, unspecified: Secondary | ICD-10-CM | POA: Diagnosis not present

## 2023-11-11 DIAGNOSIS — Z89421 Acquired absence of other right toe(s): Secondary | ICD-10-CM | POA: Diagnosis not present

## 2023-11-11 DIAGNOSIS — M86161 Other acute osteomyelitis, right tibia and fibula: Secondary | ICD-10-CM | POA: Diagnosis not present

## 2023-11-11 DIAGNOSIS — M868X6 Other osteomyelitis, lower leg: Secondary | ICD-10-CM | POA: Diagnosis not present

## 2023-11-12 DIAGNOSIS — I10 Essential (primary) hypertension: Secondary | ICD-10-CM | POA: Diagnosis not present

## 2023-11-12 DIAGNOSIS — I739 Peripheral vascular disease, unspecified: Secondary | ICD-10-CM | POA: Diagnosis not present

## 2023-11-12 DIAGNOSIS — Z940 Kidney transplant status: Secondary | ICD-10-CM | POA: Diagnosis not present

## 2023-11-12 DIAGNOSIS — N186 End stage renal disease: Secondary | ICD-10-CM | POA: Diagnosis not present

## 2023-11-12 DIAGNOSIS — I96 Gangrene, not elsewhere classified: Secondary | ICD-10-CM | POA: Diagnosis not present

## 2023-11-13 DIAGNOSIS — I10 Essential (primary) hypertension: Secondary | ICD-10-CM | POA: Diagnosis not present

## 2023-11-13 DIAGNOSIS — Z940 Kidney transplant status: Secondary | ICD-10-CM | POA: Diagnosis not present

## 2023-11-13 DIAGNOSIS — I96 Gangrene, not elsewhere classified: Secondary | ICD-10-CM | POA: Diagnosis not present

## 2023-11-13 DIAGNOSIS — I739 Peripheral vascular disease, unspecified: Secondary | ICD-10-CM | POA: Diagnosis not present

## 2023-11-13 DIAGNOSIS — N186 End stage renal disease: Secondary | ICD-10-CM | POA: Diagnosis not present

## 2023-11-13 DIAGNOSIS — Z7409 Other reduced mobility: Secondary | ICD-10-CM | POA: Diagnosis not present

## 2023-11-13 DIAGNOSIS — Z789 Other specified health status: Secondary | ICD-10-CM | POA: Diagnosis not present

## 2023-11-13 NOTE — Unmapped External Note (Signed)
-------------------------------------------------------------------------------   Attestation signed by Jennings Graciela Keeler, MD at 11/13/2023  4:27 PM I agree -------------------------------------------------------------------------------        [place] [employer] [facility] Form   Prior Approval   IDENTIFICATION  Patient Name: [name] Birthdate: Nov 08, 1940  SSN:  [id number]  Sex: male Admission Date (Current Location): 11/06/2023  [facility] and [place] Number:    Facility and Address:  [facility] [facility] -  [address] [facility] MEDICAL TELEMETRY UNIT [address] [place] [address]  Provider Number: [id number]  Attending Physician Name:   Jennings Graciela Keeler* Address:  [address] [place] [address] Relative Name and Address:  Extended Emergency Contact Information Primary Emergency Contact: Krus,Rebecca Address: [address]          [place], [place] [address] [place] Home Phone: [phone] Work Phone: [phone] Relation: Spouse  Current Level of Care: Hospital Recommended Level of Care: Skilled Nursing Facility Prior Approval Number:    Date Approved/Denied:   PASRR Number: [id number]  Discharge Plan: Skilled Nursing Facility    Current Diagnoses: Principal Problem:   Gangrene    (CMD) Active Problems:   Impaired mobility and ADLs Resolved Problems:   * No resolved hospital problems. *    DISORIENTED AMBULATORY STATUS BLADDER BOWEL    Non-Ambulatory (Previous Left BKA; Recent Right BKA (11/11/23)) Continent Continent  INAPPROPRIATE BEHAVIOR FUNCTIONAL LIMITATIONS COMMUNICATION OF NEEDS RESPIRATION      Verbally Normal  PERSONAL CARE ASSISTANCE ACTIVITIES/SOCIAL SKIN NUTRITION STATUS  Bathing, Dressing Active, Family Supportive Other (comment) (Scrotal wound; Incision-R BKA) Diet (Regular; Potassium restricted) Height: 1.7 m (5' 6.93) (11/11/2023 11:47  AM) Weight: 63.5 kg (140 lb) (11/11/2023 11:47 AM)  PHYSICIAN VISITS NEUROLOGICAL    30 days          SPECIAL CARE FACTORS FREQUENCY  Occupational Therapy, PT (By Licensed PT)                      MEDICATIONS: Scheduled Meds:amLODIPine , 5 mg, oral, Daily [Held by provider] apixaban, 2.5 mg, oral, BID aspirin , 81 mg, oral, Daily atorvastatin, 10 mg, oral, At Bedtime calcitrioL , 0.5 mcg, oral, Daily cinacalcet, 30 mg, oral, BID ezetimibe, 10 mg, oral, Daily gabapentin, 300 mg, oral, BID magnesium  oxide, 400 mg, oral, BID magnesium  sulfate, 2 g, intravenous, Once metoprolol succinate, 25 mg, oral, Daily mycophenolate , 360 mg, oral, BID polyethylene glycol, 17 g, oral, Daily predniSONE , 5 mg, oral, Daily senna, 1 tablet, oral, BID sodium bicarbonate , 650 mg, oral, BID sodium zirconium cyclosilicate, 10 g, oral, Once per day on Monday Wednesday Friday tacrolimus , 4 mg, oral, BID   Continuous Infusions:  PRN Meds:..  acetaminophen  .  artificial tears .  benzonatate  .  dextrose  .  dextrose  .  diphenhydrAMINE  .  melatonin .  naloxone .  naloxone .  naloxone .  oxyCODONE .  senna .  sodium chloride  Do not use this list as official medication orders. Please verify with discharge summary.  X-ray and Laboratory Findings/Date: See Clinical Information for details     ADDITIONAL INFORMATION:    Rosina CHRISTELLA Sharps, CCA

## 2023-11-14 DIAGNOSIS — Z940 Kidney transplant status: Secondary | ICD-10-CM | POA: Diagnosis not present

## 2023-11-14 DIAGNOSIS — D649 Anemia, unspecified: Secondary | ICD-10-CM | POA: Diagnosis not present

## 2023-11-14 DIAGNOSIS — E875 Hyperkalemia: Secondary | ICD-10-CM | POA: Diagnosis not present

## 2023-11-14 DIAGNOSIS — I480 Paroxysmal atrial fibrillation: Secondary | ICD-10-CM | POA: Diagnosis not present

## 2023-11-14 DIAGNOSIS — Z789 Other specified health status: Secondary | ICD-10-CM | POA: Diagnosis not present

## 2023-11-14 DIAGNOSIS — Z7409 Other reduced mobility: Secondary | ICD-10-CM | POA: Diagnosis not present

## 2023-11-14 DIAGNOSIS — I739 Peripheral vascular disease, unspecified: Secondary | ICD-10-CM | POA: Diagnosis not present

## 2023-11-14 DIAGNOSIS — I10 Essential (primary) hypertension: Secondary | ICD-10-CM | POA: Diagnosis not present

## 2023-11-14 DIAGNOSIS — D638 Anemia in other chronic diseases classified elsewhere: Secondary | ICD-10-CM | POA: Diagnosis not present

## 2023-11-14 DIAGNOSIS — I96 Gangrene, not elsewhere classified: Secondary | ICD-10-CM | POA: Diagnosis not present

## 2023-11-15 DIAGNOSIS — I96 Gangrene, not elsewhere classified: Secondary | ICD-10-CM | POA: Diagnosis not present

## 2023-11-15 DIAGNOSIS — I480 Paroxysmal atrial fibrillation: Secondary | ICD-10-CM | POA: Diagnosis not present

## 2023-11-15 DIAGNOSIS — Z789 Other specified health status: Secondary | ICD-10-CM | POA: Diagnosis not present

## 2023-11-15 DIAGNOSIS — D638 Anemia in other chronic diseases classified elsewhere: Secondary | ICD-10-CM | POA: Diagnosis not present

## 2023-11-15 DIAGNOSIS — N186 End stage renal disease: Secondary | ICD-10-CM | POA: Diagnosis not present

## 2023-11-15 DIAGNOSIS — I10 Essential (primary) hypertension: Secondary | ICD-10-CM | POA: Diagnosis not present

## 2023-11-15 DIAGNOSIS — Z940 Kidney transplant status: Secondary | ICD-10-CM | POA: Diagnosis not present

## 2023-11-15 DIAGNOSIS — I739 Peripheral vascular disease, unspecified: Secondary | ICD-10-CM | POA: Diagnosis not present

## 2023-11-15 DIAGNOSIS — Z7409 Other reduced mobility: Secondary | ICD-10-CM | POA: Diagnosis not present

## 2023-11-15 DIAGNOSIS — E875 Hyperkalemia: Secondary | ICD-10-CM | POA: Diagnosis not present

## 2023-11-16 DIAGNOSIS — N186 End stage renal disease: Secondary | ICD-10-CM | POA: Diagnosis not present

## 2023-11-16 DIAGNOSIS — E875 Hyperkalemia: Secondary | ICD-10-CM | POA: Diagnosis not present

## 2023-11-16 DIAGNOSIS — I96 Gangrene, not elsewhere classified: Secondary | ICD-10-CM | POA: Diagnosis not present

## 2023-11-16 DIAGNOSIS — Z7409 Other reduced mobility: Secondary | ICD-10-CM | POA: Diagnosis not present

## 2023-11-16 DIAGNOSIS — Z789 Other specified health status: Secondary | ICD-10-CM | POA: Diagnosis not present

## 2023-11-16 DIAGNOSIS — I480 Paroxysmal atrial fibrillation: Secondary | ICD-10-CM | POA: Diagnosis not present

## 2023-11-16 DIAGNOSIS — I10 Essential (primary) hypertension: Secondary | ICD-10-CM | POA: Diagnosis not present

## 2023-11-16 DIAGNOSIS — D638 Anemia in other chronic diseases classified elsewhere: Secondary | ICD-10-CM | POA: Diagnosis not present

## 2023-11-16 DIAGNOSIS — Z940 Kidney transplant status: Secondary | ICD-10-CM | POA: Diagnosis not present

## 2023-11-16 DIAGNOSIS — I739 Peripheral vascular disease, unspecified: Secondary | ICD-10-CM | POA: Diagnosis not present

## 2023-11-17 DIAGNOSIS — I739 Peripheral vascular disease, unspecified: Secondary | ICD-10-CM | POA: Diagnosis not present

## 2023-11-17 DIAGNOSIS — D638 Anemia in other chronic diseases classified elsewhere: Secondary | ICD-10-CM | POA: Diagnosis not present

## 2023-11-17 DIAGNOSIS — Z7409 Other reduced mobility: Secondary | ICD-10-CM | POA: Diagnosis not present

## 2023-11-17 DIAGNOSIS — I96 Gangrene, not elsewhere classified: Secondary | ICD-10-CM | POA: Diagnosis not present

## 2023-11-17 DIAGNOSIS — Z789 Other specified health status: Secondary | ICD-10-CM | POA: Diagnosis not present

## 2023-11-17 DIAGNOSIS — N186 End stage renal disease: Secondary | ICD-10-CM | POA: Diagnosis not present

## 2023-11-17 DIAGNOSIS — E875 Hyperkalemia: Secondary | ICD-10-CM | POA: Diagnosis not present

## 2023-11-17 DIAGNOSIS — I10 Essential (primary) hypertension: Secondary | ICD-10-CM | POA: Diagnosis not present

## 2023-11-17 DIAGNOSIS — Z940 Kidney transplant status: Secondary | ICD-10-CM | POA: Diagnosis not present

## 2023-11-17 DIAGNOSIS — I480 Paroxysmal atrial fibrillation: Secondary | ICD-10-CM | POA: Diagnosis not present

## 2023-11-18 DIAGNOSIS — T8131XD Disruption of external operation (surgical) wound, not elsewhere classified, subsequent encounter: Secondary | ICD-10-CM | POA: Diagnosis not present

## 2023-11-18 DIAGNOSIS — I739 Peripheral vascular disease, unspecified: Secondary | ICD-10-CM | POA: Diagnosis not present

## 2023-11-18 DIAGNOSIS — R41841 Cognitive communication deficit: Secondary | ICD-10-CM | POA: Diagnosis not present

## 2023-11-18 DIAGNOSIS — R531 Weakness: Secondary | ICD-10-CM | POA: Diagnosis not present

## 2023-11-18 DIAGNOSIS — D638 Anemia in other chronic diseases classified elsewhere: Secondary | ICD-10-CM | POA: Diagnosis not present

## 2023-11-18 DIAGNOSIS — I10 Essential (primary) hypertension: Secondary | ICD-10-CM | POA: Diagnosis not present

## 2023-11-18 DIAGNOSIS — E1122 Type 2 diabetes mellitus with diabetic chronic kidney disease: Secondary | ICD-10-CM | POA: Diagnosis not present

## 2023-11-18 DIAGNOSIS — Z940 Kidney transplant status: Secondary | ICD-10-CM | POA: Diagnosis not present

## 2023-11-18 DIAGNOSIS — M6281 Muscle weakness (generalized): Secondary | ICD-10-CM | POA: Diagnosis not present

## 2023-11-18 DIAGNOSIS — Z7409 Other reduced mobility: Secondary | ICD-10-CM | POA: Diagnosis not present

## 2023-11-18 DIAGNOSIS — N184 Chronic kidney disease, stage 4 (severe): Secondary | ICD-10-CM | POA: Diagnosis not present

## 2023-11-18 DIAGNOSIS — L89893 Pressure ulcer of other site, stage 3: Secondary | ICD-10-CM | POA: Diagnosis not present

## 2023-11-18 DIAGNOSIS — I480 Paroxysmal atrial fibrillation: Secondary | ICD-10-CM | POA: Diagnosis not present

## 2023-11-18 DIAGNOSIS — I129 Hypertensive chronic kidney disease with stage 1 through stage 4 chronic kidney disease, or unspecified chronic kidney disease: Secondary | ICD-10-CM | POA: Diagnosis not present

## 2023-11-18 DIAGNOSIS — E875 Hyperkalemia: Secondary | ICD-10-CM | POA: Diagnosis not present

## 2023-11-18 DIAGNOSIS — L89153 Pressure ulcer of sacral region, stage 3: Secondary | ICD-10-CM | POA: Diagnosis not present

## 2023-11-18 DIAGNOSIS — D631 Anemia in chronic kidney disease: Secondary | ICD-10-CM | POA: Diagnosis not present

## 2023-11-18 DIAGNOSIS — R2689 Other abnormalities of gait and mobility: Secondary | ICD-10-CM | POA: Diagnosis not present

## 2023-11-18 DIAGNOSIS — Z743 Need for continuous supervision: Secondary | ICD-10-CM | POA: Diagnosis not present

## 2023-11-18 DIAGNOSIS — N189 Chronic kidney disease, unspecified: Secondary | ICD-10-CM | POA: Diagnosis not present

## 2023-11-18 DIAGNOSIS — Z89511 Acquired absence of right leg below knee: Secondary | ICD-10-CM | POA: Diagnosis not present

## 2023-11-18 DIAGNOSIS — Z4781 Encounter for orthopedic aftercare following surgical amputation: Secondary | ICD-10-CM | POA: Diagnosis not present

## 2023-11-18 DIAGNOSIS — Z789 Other specified health status: Secondary | ICD-10-CM | POA: Diagnosis not present

## 2023-11-18 DIAGNOSIS — L988 Other specified disorders of the skin and subcutaneous tissue: Secondary | ICD-10-CM | POA: Diagnosis not present

## 2023-11-18 DIAGNOSIS — Z89612 Acquired absence of left leg above knee: Secondary | ICD-10-CM | POA: Diagnosis not present

## 2023-11-18 DIAGNOSIS — R1312 Dysphagia, oropharyngeal phase: Secondary | ICD-10-CM | POA: Diagnosis not present

## 2023-11-18 NOTE — Discharge Summary (Signed)
 ------------------------------------------------------------------------------- Attestation signed by Jennings Graciela Keeler, MD at 11/18/2023  4:52 PM I reviewed the case with the PA and agree with the plan -------------------------------------------------------------------------------  Hospitalist  Discharge Summary   Name: [name] [facility] Age: 83 yrs  MRN: [id number] DOB: 01-Sep-1940  Admit Date: 11/06/2023 Admitting Physician: Erica Heddy Lia, MD  Discharge Date: 11/18/2023 Discharge Physician: Jennings Graciela Maduabum*   Admission Diagnosis:   Gangrene    (CMD) [I96]   Discharge Diagnoses:   Principal Problem (Resolved):   Gangrene    (CMD) Active Problems:   Primary hypertension   Hyperkalemia   Paroxysmal atrial fibrillation    (CMD)   PAD (peripheral artery disease)   Impaired mobility and ADLs   Type 2 diabetes mellitus, without long-term current use of insulin    (CMD)   Anemia of chronic disease Resolved Problems:   Hypomagnesemia   TO DO List at Follow-up for PCP/Specialist:   Key Medication changes: Continue to take Tylenol  as needed for pain Other: Follow-up with vascular surgery for continued management status post right BKA.     Hospital Course:   For full details, please see H&P, progress notes, consult notes and ancillary notes. Briefly, Fawaz Borquez is a 83 y.o. year old male with a PMH of HTN, pAfib on eliquis 2.5mg  BID, T2DM, s/p kidney transplant 2013 with current CKD4, PAD, PVD, L AKA (12/2022), recently admitted 7/1-7/11/25 with RLE ischemia with infected vascular ulcer and underwent right TMA presented as a direct admission per podiatry for poorly healing wound and possible OM. MRI of right foot 8/6 with nonspecific mild T2 marrow hyperintensity distally within remaining metatarsal bases without evidence of cortical destruction or definite osteomyelitis. Recurrent early osteomyelitis difficult to exclude. No evidence of soft tissue abscess or  foreign body. Nonspecific subQ edema throughout foot.  Antibiotics deferred due to no fever or leukocytosis, pt remained HDS.  Podiatry consulted initially, recommended vascular surgery consult for proximal amputation.  Vascular surgery consulted, took patient to the OR on 8/11 for right BKA.  Patient tolerated procedure well, evaluated by PT with recs for rehab.  Initially on PCA, transition to oral oxycodone and then requiring minimal pain medications.  Patient's Eliquis was restarted after procedure on 8/15 with stable hemoglobin.  Nephrology followed patient during hospitalization for management of status post renal transplant and CKD.  Patient is medically stable for discharge.  All questions answered.  The patient's hospital course will be summarized in a problem based approach below.   Right Nonhealing TMA Hx of PAD / PVD In 10/2023, underwent angioplasty of right SFA and high risk TMA of right foot. Surgical cx grew Morganella morganii, ID recs for oral cefdinir on DC, completed course.  Sent back to [facility] by podiatry for non healing TMA. MRI 8/6 with nonspecific mild T2 marrow hyperintensity distally within remaining metatarsal bases without evidence of cortical destruction or definite osteomyelitis. Recurrent early osteomyelitis difficult to exclude. No evidence of soft tissue abscess or foreign body. Nonspecific subQ edema throughout foot.  - Vascular surgery following, s/p pre-op optimization 1u pRBC for Hgb 7.1 and right BKA on 8/11.  - Eliquis held initially per vascular surgery, restarted on 8/15.  Hemoglobin stable. - Transitioned from PCA to oral oxycodone 5 mg every 4 hours PRN 8/13 by vascular surgery.  Pain well-controlled currently. - [facility] contacted by vascular surgery for placement of stump shrinker, Ambi shield and possible prosthetic of right lower extremity. - Continue home ASA and statin  - PT/OT evaluated  with recs for [facility] but not [facility] by [facility].  Will be going to SNF.     Hypomagnesemia, resolved - Continue to monitor and replace as needed   Scrotal Wound, POA - Wound care involved, follow recs: Recommend gently cleansing perianal and coccyx/sacral region with bedside incontinent spray and gently pat dry. Then apply a thin layer of zinc paste to the area twice a day. Recommend pt to not wear pull up or adult brief but pt requests it. Recommend waffle cushion under pt to redistribute pressure. Recommend pt turning frequently based on pt's individual needs, skin tolerance and support surface used.  - Wound care assessment 8/13: Coccyx are extending from the perianal region is healed. Recommend continue with current treatment of zinc paste and waffle cushion.    Chronic Medical Conditions: T2DM Last a1c results: 5.1 on 11/06/2023. Not on any meds at home. - SSI discontinued  - Continue to monitor glucose - Hypoglycemia protocol pAfib - Eliquis initially held per vascular surgery, restarted on 8/15.  Mod pHTN, Mod AS, Mild/Mod MR  Mild Hyperkalemia - Nephrology following as stated below - On Lokelma, potassium restricted diet S/p Renal Transplant with CKD4 - Nephrology following, creatinine at baseline, continue to monitor with labs - Avoid nephrotoxic medications - Renally dose medications Anemia of Chronic Disease HTN - Continue home medications as appropriate for IP setting   The patient's chronic medical conditions were treated accordingly per the patient's home medication regimen except as noted in the plan above and in the medication list below.    Discharge Condition:   Disposition: Patient discharged to Skilled Nursing Facility (CMS Certified) in stable condition.  Diet at discharge: Adult Diet- Regular; Potassium restricted Nepro; Daily with Lunch  Activity at Discharge: Ambulate ad lib  Wound/Incision Care Instructions: follow wound care recs mentioned above and post-op care as suggested by vascular surgery in discharge instructions    Photo of wound/incision: N/A   Wound 10/28/22 Other (comment) Groin Right;Left (Active)  Date First Assessed/Time First Assessed: 10/28/22 2030   Pre-Existing Wound: Yes  Primary Wound Type: Other (comment)  Location: Groin  Wound Location Orientation: Right;Left  Wound Description (Comments): urinary incontinence moisture related skin da...     Wound 10/02/23 Sacrum Mid (Active)  Date First Assessed/Time First Assessed: 10/02/23 0205   Pre-Existing Wound: Yes  Location: Sacrum  Wound Location Orientation: Mid     Wound 10/02/23 Buttocks Left (Active)  Date First Assessed/Time First Assessed: 10/02/23 0205   Pre-Existing Wound: Yes  Location: Buttocks  Wound Location Orientation: Left  Wound Description (Comments): pressure injury, see pic     Wound 11/11/23 Knee Proximal;Right (Active)  Date First Assessed/Time First Assessed: 11/11/23 1228   Location: Knee  Wound Location Orientation: Proximal;Right  Wound Description (Comments): xeroform, kerlix, ace, knee immobilizer     Wound Knee (Active)  No Date First Assessed or Time First Assessed found.   Location: Knee     Physical Exam at Discharge   BP 133/77 (BP Location: Right arm, Patient Position: Lying)   Pulse 65   Temp 97.3 F (36.3 C) (Oral)   Resp 18   Ht 1.7 m (5' 6.93)   Wt 62.9 kg (138 lb 10.7 oz)   SpO2 100%   BMI 21.76 kg/m   Physical Exam Constitutional:      General: He is not in acute distress.    Appearance: He is not ill-appearing.  HENT:     Head: Normocephalic.   Eyes:  Extraocular Movements: Extraocular movements intact.    Cardiovascular:     Rate and Rhythm: Normal rate and regular rhythm.     Heart sounds: Normal heart sounds.  Pulmonary:     Effort: Pulmonary effort is normal.     Breath sounds: Normal breath sounds.  Abdominal:     Palpations: Abdomen is soft.     Tenderness: There is no abdominal tenderness. There is no guarding.   Musculoskeletal:     Comments: Left AKA. Right BKA  in shield.    Skin:    General: Skin is warm and dry.   Neurological:     Mental Status: He is alert and oriented to person, place, and time.   Psychiatric:        Mood and Affect: Mood normal.        Behavior: Behavior normal.       Discharge Medications:       Medication List     PAUSE taking these medications    * mycophenolate  180 mg Tbec DR tablet Wait to take this until your doctor or other care provider tells you to start again. Commonly known as: MYFORTIC  Take 2 tablets (360 mg total) by mouth 2 (two) times a day. You also have another medication with the same name that you may need to continue taking.      * * This list has 1 medication(s) that are the same as other medications prescribed for you. Read the directions carefully, and ask your doctor or other care provider to review them with you.          CHANGE how you take these medications    * mycophenolate  180 mg Tbec DR tablet Commonly known as: MYFORTIC  take 2 tablets by mouth twice daily What changed: Another medication with the same name was paused. Ask your nurse or doctor if you should take this medication.      * * This list has 1 medication(s) that are the same as other medications prescribed for you. Read the directions carefully, and ask your doctor or other care provider to review them with you.          CONTINUE taking these medications    amLODIPine  5 mg tablet Commonly known as: NORVASC  Take 1 tablet by mouth daily. (1 tab(s) orally once a day 90 days)   apixaban 2.5 mg Tab Commonly known as: ELIQUIS Take 1 tablet (2.5 mg total) by mouth 2 (two) times a day.   Artificial Tears (cmc) 1 % Drop Generic drug: carboxymethylcellulose sodium Administer 1 drop into affected eye(s) 2 (two) times a day.   aspirin  81 mg chewable tablet Chew 81 mg daily.   calcitrioL  0.5 mcg capsule Commonly known as: ROCALTROL  TAKE 1 CAPSULE BY MOUTH ONCE A DAY   cinacalcet 30 mg  tablet Commonly known as: SENSIPAR Take 30 mg by mouth 2 (two) times a day.   ezetimibe 10 mg tablet Commonly known as: ZETIA Take 10 mg by mouth daily.   gabapentin 300 mg capsule Commonly known as: NEURONTIN Take 1 capsule (300 mg total) by mouth 2 (two) times a day.   magnesium  oxide 400 mg (241 mg magnesium ) Tab Take 1 tablet by mouth 2 times daily.   metoprolol succinate 25 mg 24 hr tablet Commonly known as: TOPROL XL Take 1 tablet (25 mg total) by mouth daily.   Pain Reliever (acetaminophen ) 500 mg tablet Generic drug: acetaminophen  Take 2 tablets (1,000 mg total) by mouth every 8 (eight)  hours as needed for moderate or mild pain. (Take 2 tablets (1,000 mg total) by mouth every 8 (eight) hours as needed for moderate pain (4-6) or mild pain (1-3).)   predniSONE  5 mg tablet Commonly known as: DELTASONE  Take 1 tablet by mouth once daily   simvastatin  20 mg tablet Commonly known as: ZOCOR  Take 20 mg by mouth every evening.   sodium bicarbonate  650 mg tablet Take 1 tablet (650 mg total) by mouth 2 (two) times a day.   sodium zirconium cyclosilicate 10 g Pwpk packet Commonly known as: LOKELMA Take 10 g by mouth every Monday, Wednesday and Friday.   tacrolimus  1 mg capsule Commonly known as: PROGRAF  Take 4 capsules by mouth 2 times daily. (Take 4 tabs twice daily Z94.0)       STOP taking these medications    oxyCODONE 5 mg immediate release tablet Commonly known as: ROXICODONE       ASK your doctor about these medications    atorvastatin 10 mg tablet Commonly known as: LIPITOR Take 1 tablet (10 mg total) by mouth at bedtime.        Significant Diagnostic Tests:   LABS:  Lab Results  Component Value Date   WBC 6.06 11/18/2023   HGB 7.8 (L) 11/18/2023   HCT 23.8 (L) 11/18/2023   PLT 230 11/18/2023   CHOL 160 06/02/2019   TRIG 131 06/02/2019   HDL 106 06/02/2019   LDLDIRECT 20 06/02/2019   ALT 5 (L) 11/06/2023   AST 7 (L) 11/06/2023   NA 138  11/18/2023   K 5.0 (H) 11/18/2023   CL 108 (H) 11/18/2023   CREATININE 2.46 (H) 11/18/2023   BUN 48 (H) 11/18/2023   CO2 26 11/18/2023   TSH 1.526 10/21/2022   PTT 194.7 (H) 10/02/2023   INR 1.2 10/01/2023   HGBA1C 5.1 11/06/2023   IMAGING:  MRI Foot Joint Right WO Contrast  Final Result by Delmar Herbert Winfred Booker Results In [place] [id number] (08/07 9085)  CLINICAL DATA:  Foot swelling, nondiabetic, osteomyelitis suspected    Post transmetatarsal amputation 10/20/2023    EXAM:  MRI OF THE RIGHT FOREFOOT WITHOUT CONTRAST    TECHNIQUE:  Multiplanar, multisequence MR imaging of the remaining right foot  was performed. No intravenous contrast was administered.    COMPARISON:  Right foot radiographs 10/01/2023 and 10/05/2023. No  comparison MRI available.    FINDINGS:  Bones/Joint/Cartilage    As shown on the most recent radiographs, the patient is status post  transmetatarsal amputation through the bases of all the metatarsals.  There is nonspecific mild T2 marrow hyperintensity distally within  the remaining metatarsal bases without evidence of cortical  destruction. There are moderate degenerative changes at the Lisfranc  joint, most notable at the 2nd and 3rd tarsometatarsal joints. Mild  talonavicular degenerative changes. No acute osseous findings or  evidence of osteomyelitis within the midfoot or hindfoot. No  significant joint effusions.    Ligaments    Intact Lisfranc ligament.    Muscles and Tendons    The ankle tendons appear intact, without significant tenosynovitis.  Postsurgical changes within the forefoot tendons and plantar fascia  related to transmetatarsal amputation. There is T2 hyperintensity  throughout the plantar foot musculature, most likely subacute  denervation.    Soft tissues    The distal soft tissues appear attenuated at the amputation site  without obvious bone exposure or organized fluid collection.  Nonspecific mild subcutaneous edema throughout  the remaining foot.  No evidence of foreign  body or soft tissue emphysema.    IMPRESSION:  1. Status post transmetatarsal amputation through the bases of all  the metatarsals. There is nonspecific mild T2 marrow hyperintensity  distally within the remaining metatarsal bases without evidence of  cortical destruction or definite osteomyelitis. Recurrent early  osteomyelitis difficult to exclude.  2. No evidence of soft tissue abscess or foreign body.  3. Nonspecific subcutaneous edema throughout the remaining foot.  4. Moderate degenerative changes at the Lisfranc joint.      Electronically Signed    By: Elsie Perone M.D.    On: 11/07/2023 09:14       CULTURES:  No results found for this visit on 11/06/23 (from the past week). PATHOLOGY: N/A OTHER: N/A  Surgeries/Procedures:   Procedure(s) (LRB): AMPUTATION LEG BELOW KNEE (Right)  Other procedures performed: N/A  Consults:   WOUND OSTOMY EVAL AND TREAT IP CONSULT TO PODIATRY IP CONSULT TO NEPHROLOGY IP CONSULT TO VASCULAR SURGERY   Follow-up Appointments:     Future Appointments  Date Time Provider Department Center  12/12/2023  8:45 AM [facility] 04 [facility] VASCULAR 3 [facility] [address] [facility]  12/12/2023 10:40 AM Isaiah Riggs Myra [facility] VSUR [facility] 306 [facility]  03/16/2024  9:45 AM Alverna Lamar Sieving, DO [facility] CAR [facility] [address]         Electronically signed by: Yolanda LULLA Fairly, PA-C 11/18/2023  Time spent on discharge: 35 minutes.  *Some images could not be shown.

## 2023-11-21 DIAGNOSIS — Z89612 Acquired absence of left leg above knee: Secondary | ICD-10-CM | POA: Diagnosis not present

## 2023-11-21 DIAGNOSIS — Z4781 Encounter for orthopedic aftercare following surgical amputation: Secondary | ICD-10-CM | POA: Diagnosis not present

## 2023-11-21 DIAGNOSIS — Z940 Kidney transplant status: Secondary | ICD-10-CM | POA: Diagnosis not present

## 2023-11-21 DIAGNOSIS — I480 Paroxysmal atrial fibrillation: Secondary | ICD-10-CM | POA: Diagnosis not present

## 2023-11-21 DIAGNOSIS — R2689 Other abnormalities of gait and mobility: Secondary | ICD-10-CM | POA: Diagnosis not present

## 2023-11-21 DIAGNOSIS — Z89511 Acquired absence of right leg below knee: Secondary | ICD-10-CM | POA: Diagnosis not present

## 2023-11-21 DIAGNOSIS — M6281 Muscle weakness (generalized): Secondary | ICD-10-CM | POA: Diagnosis not present

## 2023-11-25 DIAGNOSIS — Z4781 Encounter for orthopedic aftercare following surgical amputation: Secondary | ICD-10-CM | POA: Diagnosis not present

## 2023-11-25 DIAGNOSIS — R2689 Other abnormalities of gait and mobility: Secondary | ICD-10-CM | POA: Diagnosis not present

## 2023-11-25 DIAGNOSIS — Z89612 Acquired absence of left leg above knee: Secondary | ICD-10-CM | POA: Diagnosis not present

## 2023-11-25 DIAGNOSIS — I480 Paroxysmal atrial fibrillation: Secondary | ICD-10-CM | POA: Diagnosis not present

## 2023-11-25 DIAGNOSIS — L988 Other specified disorders of the skin and subcutaneous tissue: Secondary | ICD-10-CM | POA: Diagnosis not present

## 2023-11-25 DIAGNOSIS — Z940 Kidney transplant status: Secondary | ICD-10-CM | POA: Diagnosis not present

## 2023-11-25 DIAGNOSIS — M6281 Muscle weakness (generalized): Secondary | ICD-10-CM | POA: Diagnosis not present

## 2023-11-25 DIAGNOSIS — Z89511 Acquired absence of right leg below knee: Secondary | ICD-10-CM | POA: Diagnosis not present

## 2023-12-05 DIAGNOSIS — Z89612 Acquired absence of left leg above knee: Secondary | ICD-10-CM | POA: Diagnosis not present

## 2023-12-05 DIAGNOSIS — R2689 Other abnormalities of gait and mobility: Secondary | ICD-10-CM | POA: Diagnosis not present

## 2023-12-05 DIAGNOSIS — Z940 Kidney transplant status: Secondary | ICD-10-CM | POA: Diagnosis not present

## 2023-12-05 DIAGNOSIS — Z89511 Acquired absence of right leg below knee: Secondary | ICD-10-CM | POA: Diagnosis not present

## 2023-12-05 DIAGNOSIS — M6281 Muscle weakness (generalized): Secondary | ICD-10-CM | POA: Diagnosis not present

## 2023-12-05 DIAGNOSIS — I480 Paroxysmal atrial fibrillation: Secondary | ICD-10-CM | POA: Diagnosis not present

## 2023-12-05 DIAGNOSIS — Z4781 Encounter for orthopedic aftercare following surgical amputation: Secondary | ICD-10-CM | POA: Diagnosis not present

## 2023-12-09 DIAGNOSIS — Z4781 Encounter for orthopedic aftercare following surgical amputation: Secondary | ICD-10-CM | POA: Diagnosis not present

## 2023-12-09 DIAGNOSIS — I480 Paroxysmal atrial fibrillation: Secondary | ICD-10-CM | POA: Diagnosis not present

## 2023-12-09 DIAGNOSIS — M6281 Muscle weakness (generalized): Secondary | ICD-10-CM | POA: Diagnosis not present

## 2023-12-09 DIAGNOSIS — Z940 Kidney transplant status: Secondary | ICD-10-CM | POA: Diagnosis not present

## 2023-12-09 DIAGNOSIS — Z89612 Acquired absence of left leg above knee: Secondary | ICD-10-CM | POA: Diagnosis not present

## 2023-12-09 DIAGNOSIS — R2689 Other abnormalities of gait and mobility: Secondary | ICD-10-CM | POA: Diagnosis not present

## 2023-12-09 DIAGNOSIS — Z89511 Acquired absence of right leg below knee: Secondary | ICD-10-CM | POA: Diagnosis not present

## 2023-12-12 DIAGNOSIS — Z940 Kidney transplant status: Secondary | ICD-10-CM | POA: Diagnosis not present

## 2023-12-12 DIAGNOSIS — I480 Paroxysmal atrial fibrillation: Secondary | ICD-10-CM | POA: Diagnosis not present

## 2023-12-12 DIAGNOSIS — Z89612 Acquired absence of left leg above knee: Secondary | ICD-10-CM | POA: Diagnosis not present

## 2023-12-12 DIAGNOSIS — Z89511 Acquired absence of right leg below knee: Secondary | ICD-10-CM | POA: Diagnosis not present

## 2023-12-12 DIAGNOSIS — M6281 Muscle weakness (generalized): Secondary | ICD-10-CM | POA: Diagnosis not present

## 2023-12-12 DIAGNOSIS — Z4781 Encounter for orthopedic aftercare following surgical amputation: Secondary | ICD-10-CM | POA: Diagnosis not present

## 2023-12-12 DIAGNOSIS — R2689 Other abnormalities of gait and mobility: Secondary | ICD-10-CM | POA: Diagnosis not present

## 2023-12-16 DIAGNOSIS — Z89511 Acquired absence of right leg below knee: Secondary | ICD-10-CM | POA: Diagnosis not present

## 2023-12-16 DIAGNOSIS — R2689 Other abnormalities of gait and mobility: Secondary | ICD-10-CM | POA: Diagnosis not present

## 2023-12-16 DIAGNOSIS — Z89612 Acquired absence of left leg above knee: Secondary | ICD-10-CM | POA: Diagnosis not present

## 2023-12-16 DIAGNOSIS — Z4781 Encounter for orthopedic aftercare following surgical amputation: Secondary | ICD-10-CM | POA: Diagnosis not present

## 2023-12-16 DIAGNOSIS — I480 Paroxysmal atrial fibrillation: Secondary | ICD-10-CM | POA: Diagnosis not present

## 2023-12-16 DIAGNOSIS — M6281 Muscle weakness (generalized): Secondary | ICD-10-CM | POA: Diagnosis not present

## 2023-12-16 DIAGNOSIS — Z940 Kidney transplant status: Secondary | ICD-10-CM | POA: Diagnosis not present

## 2023-12-19 DIAGNOSIS — M6281 Muscle weakness (generalized): Secondary | ICD-10-CM | POA: Diagnosis not present

## 2023-12-19 DIAGNOSIS — T8131XD Disruption of external operation (surgical) wound, not elsewhere classified, subsequent encounter: Secondary | ICD-10-CM | POA: Diagnosis not present

## 2023-12-19 DIAGNOSIS — Z89612 Acquired absence of left leg above knee: Secondary | ICD-10-CM | POA: Diagnosis not present

## 2023-12-19 DIAGNOSIS — Z940 Kidney transplant status: Secondary | ICD-10-CM | POA: Diagnosis not present

## 2023-12-19 DIAGNOSIS — I480 Paroxysmal atrial fibrillation: Secondary | ICD-10-CM | POA: Diagnosis not present

## 2023-12-19 DIAGNOSIS — R2689 Other abnormalities of gait and mobility: Secondary | ICD-10-CM | POA: Diagnosis not present

## 2023-12-19 DIAGNOSIS — Z89511 Acquired absence of right leg below knee: Secondary | ICD-10-CM | POA: Diagnosis not present

## 2023-12-19 DIAGNOSIS — Z4781 Encounter for orthopedic aftercare following surgical amputation: Secondary | ICD-10-CM | POA: Diagnosis not present

## 2023-12-19 NOTE — Progress Notes (Signed)
 VASCULAR SURGERY POST OPERATIVE NOTE  12/19/2023 Chief Complaint  Patient presents with  . Post-op    2wk Wound Check s/p R BKA (11/11/23 GLENWOOD Chihuahua, MD)      HPI [name] is a 83 y.o. male who comes in today for post op evaluation at the request [facility] for a wound on the amputation stump.  He recently underwent right below-knee amputation on 11/11/2023.  He was discharged on 11/18/2023 and at the time the BKA stump was healing without complication but later developed some drainage from the incision line.  He was last seen 2 weeks ago for a small incisional wound.  The facility has been doing local wound care since this time.  He denies any new issues.  He thinks the swelling in the stump is better.  Denies any redness, drainage, increased pain, or warmth.    Vitals:   12/19/23 1040  BP: 138/74  Pulse: 65  SpO2:     Physical exam R BKA stump incision with 4 x 2. cm wound with fibrin and slough. Bloody drainage after peeling slough back. No erythema. Stump is mildly swollen improved from 2 weeks ago.         Plan [name] is a 83 y.o. male now 3 weeks post op from right BKA.  Small anterior wound along the BKA incision line.  Slough and fibrin covering the base of the wound.  Some of this was easily peeled back with pickups. Removed the remaining staples.    Given presence of fibrinous debris at the base of the wound, will increase dressing changes to twice daily.  Will change from Aquacel to Dakin's wet to dry dressings.  Remove and change dressings twice daily. Cleanse stump and wound with wound cleanser or antibacterial soap and rinse with running water. Apply Dakin's wet to dry dressing. Wrap the stump with Kerlix and Ace for compression as long as stump swelling persists. Once swelling improves, wear post op sock. Wear Ambi shield if leaving the facility or participating in activity that may cause injury to the stump.  Otherwise, does not need to wear  the stump protector.   Follow up in 2 weeks for wound check.   Electronically signed by: Isaiah Annabella Clause, PA-C 12/19/2023 11:07 AM

## 2023-12-24 ENCOUNTER — Other Ambulatory Visit: Payer: Self-pay | Admitting: *Deleted

## 2023-12-24 DIAGNOSIS — I10 Essential (primary) hypertension: Secondary | ICD-10-CM

## 2023-12-24 NOTE — Patient Outreach (Signed)
 Post EMMI SNF discharge alert received. Verified with [name] social worker, PCP appointment was scheduled for Monday 12/23/23. Home health arranged with [facility] for PT/OT/ Nursing. No DME ordered.   Telephone call made to [name] to inquire about post SNF PCP follow up appointment. [name] confirms PCP appointment was on Monday. Reports he is doing well. Discussed VBCI CCM follow up. [name] is agreeable.  Will refer to [facility] CCM team.  [name] has medical history of HTN, pAfib, T2DM, s/p kidney transplant 2013 with current CKD4, PAD, PVD, L AKA (12/2022)  Pablo Hurst, MSN, RN, BSN [place]  [facility], [facility] RN Post- Acute Care Manager Direct Dial: [phone]

## 2023-12-27 ENCOUNTER — Other Ambulatory Visit: Payer: Self-pay | Admitting: *Deleted

## 2023-12-27 NOTE — Patient Instructions (Signed)
 Visit Information  Thank you for taking time to visit with me today. Please don't hesitate to contact me if I can be of assistance to you before our next scheduled appointment.  Our next appointment is by telephone on 01/07/2024 at 1000 Please call the care guide team at [phone] if you need to cancel or reschedule your appointment.   Following is a copy of your care plan:   Goals Addressed             This Visit's Progress    VBCI RN Care Plan-HTN       Problems:  Knowledge Deficits related to HTN  Goal: Over the next 90 days the Patient will take all medications exactly as prescribed and will call provider for medication related questions as evidenced by chart review and patient reports    verbalize understanding of plan for management of HTN as evidenced by chart review and patient report  Interventions:   Hypertension Interventions: Last practice recorded BP readings:  BP Readings from Last 3 Encounters:  12/27/23 138/70  09/24/20 (!) 142/98  03/25/19 116/71   Most recent eGFR/CrCl: No results found for: EGFR  No components found for: CRCL  Provided education to patient re: stroke prevention, s/s of heart attack and stroke Reviewed medications with patient and discussed importance of compliance Discussed plans with patient for ongoing care management follow up and provided patient with direct contact information for care management team Discussed complications of poorly controlled blood pressure such as heart disease, stroke, circulatory complications, vision complications, kidney impairment, sexual dysfunction Screening for signs and symptoms of depression related to chronic disease state  Assessed social determinant of health barriers  Patient Self-Care Activities:  Attend all scheduled provider appointments Attend church or other social activities Call pharmacy for medication refills 3-7 days in advance of running out of medications Call provider office for  new concerns or questions  Perform all self care activities independently  Take medications as prescribed   check blood pressure 3 times per week check blood pressure daily check blood pressure weekly choose a place to take my blood pressure (home, clinic or office, retail store) write blood pressure results in a log or diary learn about high blood pressure keep a blood pressure log take blood pressure log to all doctor appointments call doctor for signs and symptoms of high blood pressure keep all doctor appointments take medications for blood pressure exactly as prescribed report new symptoms to your doctor eat more whole grains, fruits and vegetables, lean meats and healthy fats limit salt intake   Plan:  Telephone follow up appointment with care management team member scheduled for:  01/07/2024 @1000              Please call the Suicide and Crisis Lifeline: 988 call the [place]  National Suicide Prevention Lifeline: (984)574-5797 or TTY: 251-854-4444 TTY 971 713 6281) to talk to a trained counselor call 1-800-273-TALK (toll free, 24 hour hotline) if you are experiencing a Mental Health or Behavioral Health Crisis or need someone to talk to.  Patient verbalizes understanding of instructions and care plan provided today and agrees to view in MyChart. Active MyChart status and patient understanding of how to access instructions and care plan via MyChart confirmed with patient.      Olam Ku, RN, BSN   [facility], [facility] RN Care Manager Direct Dial: [phone]  Fax: [phone]

## 2023-12-27 NOTE — Patient Outreach (Signed)
 Complex Care Management   Visit Note  12/27/2023  Name:  [name] MRN: [id number] DOB: Sep 12, 1940  Situation: Referral received for Complex Care Management related to HTN I obtained verbal consent from Patient.  Visit completed with Patient  on the phone  Background:   Past Medical History:  Diagnosis Date   Chronic kidney disease    HX OF KIDNEY TRANSPLANT 05/2011   GERD (gastroesophageal reflux disease)    Gout    H/O kidney transplant    Hypertension    Trauma to eye, right 05/18/2013    Assessment: Patient Reported Symptoms:  Cognitive Cognitive Status: Able to follow simple commands, Normal speech and language skills, Alert and oriented to person, place, and time   Health Maintenance Behaviors: Annual physical exam Health Facilitated by: Healthy diet, Prayer/meditation, Rest, Pain control  Neurological Neurological Review of Symptoms: No symptoms reported Neurological Management Strategies: Routine screening  HEENT HEENT Symptoms Reported: No symptoms reported HEENT Management Strategies: Routine screening    Cardiovascular Cardiovascular Symptoms Reported: No symptoms reported Does patient have uncontrolled Hypertension?: Yes Is patient checking Blood Pressure at home?: Yes Patient's Recent BP reading at home: 138/70 today right arm Cardiovascular Management Strategies: Routine screening, Coping strategies Cardiovascular Self-Management Outcome: 4 (good)  Respiratory Respiratory Symptoms Reported: No symptoms reported Respiratory Management Strategies: Routine screening Respiratory Self-Management Outcome: 4 (good)  Endocrine Endocrine Symptoms Reported: No symptoms reported Is patient diabetic?: No    Gastrointestinal Gastrointestinal Symptoms Reported: No symptoms reported Gastrointestinal Management Strategies: Coping strategies, Medication therapy    Genitourinary Genitourinary Symptoms Reported: No symptoms reported Genitourinary Management Strategies:  Coping strategies (Hx of dialysis- kidney transplant was successful on 2014) Genitourinary Self-Management Outcome: 4 (good)  Integumentary Integumentary Symptoms Reported: Wound Additional Integumentary Details: right stump wound care with dressing changes with [facility] with RN/OT once weekly for post op care via SNF discharge Skin Management Strategies: Coping strategies, Dressing changes, Routine screening, Medication therapy Skin Self-Management Outcome: 4 (good)  Musculoskeletal Musculoskelatal Symptoms Reviewed: No symptoms reported Additional Musculoskeletal Details: currently non-ambulatory due to BKA Musculoskeletal Management Strategies: Routine screening Falls in the past year?: No Number of falls in past year: 1 or less Patient at Risk for Falls Due to: No Fall Risks  Psychosocial Psychosocial Symptoms Reported: No symptoms reported Behavioral Management Strategies: Coping strategies, Support system Behavioral Health Self-Management Outcome: 4 (good) Techniques to Cope with Loss/Stress/Change: Medication Quality of Family Relationships: helpful, involved, supportive Do you feel physically threatened by others?: No    12/27/2023    PHQ2-9 Depression Screening   Little interest or pleasure in doing things Not at all  Feeling down, depressed, or hopeless Not at all  PHQ-2 - Total Score 0  Trouble falling or staying asleep, or sleeping too much    Feeling tired or having little energy    Poor appetite or overeating     Feeling bad about yourself - or that you are a failure or have let yourself or your family down    Trouble concentrating on things, such as reading the newspaper or watching television    Moving or speaking so slowly that other people could have noticed.  Or the opposite - being so fidgety or restless that you have been moving around a lot more than usual    Thoughts that you would be better off dead, or hurting yourself in some way    PHQ2-9 Total Score    If  you checked off any problems, how difficult have these problems made it  for you to do your work, take care of things at home, or get along with other people    Depression Interventions/Treatment      Vitals:   12/27/23 1539  BP: 138/70    Medications Reviewed Today     Reviewed by Alvia Olam BIRCH, RN (Registered Nurse) on 12/27/23 at 1525  Med List Status: <None>   Medication Order Taking? Sig Documenting Provider Last Dose Status Informant  allopurinol  (ZYLOPRIM ) 100 MG tablet [id number] Yes Take 100 mg by mouth daily. [provider]  Active Self  amLODipine  (NORVASC ) 10 MG tablet [id number] Yes Take 10 mg by mouth daily.  [provider]  Active Self  aspirin  81 MG chewable tablet [id number] Yes Chew 81 mg by mouth daily. [provider]  Active Self  azithromycin  (ZITHROMAX  Z-PAK) 250 MG tablet [id number]  2 pills by mouth day one, then one pill a day by mouth until complete  Patient not taking: Reported on 12/27/2023   Emil Share, DO  Active   calcitRIOL  (ROCALTROL ) 0.5 MCG capsule [id number] Yes Take 0.5 mcg by mouth every evening.  [provider]  Active Self  dextromethorphan -guaiFENesin  (MUCINEX  DM) 30-600 MG 12hr tablet [id number] Yes Take 1 tablet by mouth 2 (two) times daily as needed for cough. Cheryle Page, MD  Active   HYDROcodone -acetaminophen  (NORCO/VICODIN) 5-325 MG tablet [id number] Yes Take 1-2 tablets by mouth every 6 (six) hours as needed for moderate pain. Zackowski, Scott, MD  Active Self  K Phos  Mono-Sod Phos Leona & Mono (PHOSPHA 250 NEUTRAL) 155-852-130 MG TABS [id number] Yes Take 1 tablet by mouth daily.  [provider]  Active Self  magnesium  oxide (MAG-OX) 400 MG tablet [id number] Yes Take 400 mg by mouth 2 (two) times daily.  [provider]  Active Self  mycophenolate  (MYFORTIC ) 180 MG EC tablet [id number] Yes Take 360 mg by mouth 2 (two) times daily. [provider]  Active Self  ondansetron  (ZOFRAN ) 4 MG  tablet [id number] Yes Take 1 tablet (4 mg total) by mouth every 6 (six) hours as needed for nausea. Cheryle Page, MD  Active   predniSONE  (DELTASONE ) 20 MG tablet [id number] Yes 2 tabs po daily x 4 days Floyd, Dan, DO  Active   ranitidine (ZANTAC) 75 MG tablet [id number] Yes Take 75 mg by mouth daily. [provider]  Active Self  simvastatin  (ZOCOR ) 20 MG tablet [id number] Yes Take 20 mg by mouth every evening. [provider]  Active Self  sodium bicarbonate  650 MG tablet [id number] Yes Take 650 mg by mouth 3 (three) times daily. [provider]  Active Self  sulfamethoxazole -trimethoprim  (BACTRIM ,SEPTRA ) 400-80 MG per tablet [id number] Yes Take 1 tablet by mouth 3 (three) times a week. Monday, Wednesday and Friday [provider]  Active Self           Med Note ALLEGRA, [facility] I   Fri May 23, 2018  8:44 PM) Ongoing ABT.  tacrolimus  (PROGRAF ) 1 MG capsule [id number] Yes Take 4 mg by mouth 2 (two) times daily.  [provider]  Active Self            Recommendation:   PCP Follow-up Continue Current Plan of Care  Follow Up Plan:   Telephone follow up appointment date/time:  01/07/2024 @10 :00am   Olam Alvia, RN, BSN [place]  [facility], [facility] Health RN Care Manager Direct Dial: [phone]  Fax: [phone]

## 2023-12-30 DIAGNOSIS — Z89612 Acquired absence of left leg above knee: Secondary | ICD-10-CM | POA: Diagnosis not present

## 2023-12-30 DIAGNOSIS — N184 Chronic kidney disease, stage 4 (severe): Secondary | ICD-10-CM | POA: Diagnosis not present

## 2023-12-30 DIAGNOSIS — T8743 Infection of amputation stump, right lower extremity: Secondary | ICD-10-CM | POA: Diagnosis not present

## 2023-12-30 DIAGNOSIS — I129 Hypertensive chronic kidney disease with stage 1 through stage 4 chronic kidney disease, or unspecified chronic kidney disease: Secondary | ICD-10-CM | POA: Diagnosis not present

## 2023-12-30 DIAGNOSIS — I480 Paroxysmal atrial fibrillation: Secondary | ICD-10-CM | POA: Diagnosis not present

## 2023-12-30 DIAGNOSIS — L98429 Non-pressure chronic ulcer of back with unspecified severity: Secondary | ICD-10-CM | POA: Diagnosis not present

## 2023-12-30 DIAGNOSIS — D631 Anemia in chronic kidney disease: Secondary | ICD-10-CM | POA: Diagnosis not present

## 2024-01-02 DIAGNOSIS — T8131XD Disruption of external operation (surgical) wound, not elsewhere classified, subsequent encounter: Secondary | ICD-10-CM | POA: Diagnosis not present

## 2024-01-02 DIAGNOSIS — Z89511 Acquired absence of right leg below knee: Secondary | ICD-10-CM | POA: Diagnosis not present

## 2024-01-07 ENCOUNTER — Encounter: Payer: Self-pay | Admitting: *Deleted

## 2024-01-07 ENCOUNTER — Telehealth: Payer: PRIVATE HEALTH INSURANCE | Admitting: *Deleted

## 2024-01-07 NOTE — Patient Instructions (Signed)
 Alm Leyden - I am sorry I was unable to reach you today for our scheduled appointment. I work with Catalina Bare, MD and am calling to support your healthcare needs. Please contact me at [phone] at your earliest convenience. I look forward to speaking with you soon.   Thank you,   Olam Ku, RN, BSN [place]  [facility], [facility] Health RN Care Manager Direct Dial: [phone]  Fax: [phone]

## 2024-01-16 ENCOUNTER — Encounter: Payer: Self-pay | Admitting: *Deleted

## 2024-01-16 ENCOUNTER — Telehealth: Payer: Self-pay | Admitting: *Deleted

## 2024-01-16 DIAGNOSIS — T8131XD Disruption of external operation (surgical) wound, not elsewhere classified, subsequent encounter: Secondary | ICD-10-CM | POA: Diagnosis not present

## 2024-01-16 DIAGNOSIS — Z89511 Acquired absence of right leg below knee: Secondary | ICD-10-CM | POA: Diagnosis not present

## 2024-01-16 DIAGNOSIS — L89309 Pressure ulcer of unspecified buttock, unspecified stage: Secondary | ICD-10-CM | POA: Diagnosis not present

## 2024-01-16 NOTE — Patient Instructions (Signed)
 Alm Leyden - I am sorry I was unable to reach you today for our scheduled appointment. I work with Catalina Bare, MD and am calling to support your healthcare needs. Please contact me at [phone] at your earliest convenience. I look forward to speaking with you soon.   Thank you,   Olam Ku, RN, BSN [place]  [facility], [facility] Health RN Care Manager Direct Dial: [phone]  Fax: [phone]

## 2024-01-23 ENCOUNTER — Other Ambulatory Visit: Payer: Self-pay | Admitting: *Deleted

## 2024-01-23 NOTE — Patient Outreach (Signed)
 Complex Care Management   Visit Note  01/23/2024  Name:  [name] MRN: [id number] DOB: 14-Sep-1940  Situation: Referral received for Complex Care Management related to HTN I obtained verbal consent from Patient.  Visit completed with Patient  on the phone  Background:   Past Medical History:  Diagnosis Date   Chronic kidney disease    HX OF KIDNEY TRANSPLANT 05/2011   GERD (gastroesophageal reflux disease)    Gout    H/O kidney transplant    Hypertension    Trauma to eye, right 05/18/2013    Assessment: Patient Reported Symptoms:  Cognitive Cognitive Status: Alert and oriented to person, place, and time, Able to follow simple commands, Normal speech and language skills      Neurological Neurological Review of Symptoms: No symptoms reported    HEENT HEENT Symptoms Reported: No symptoms reported HEENT Management Strategies: Routine screening    Cardiovascular Cardiovascular Symptoms Reported: No symptoms reported Does patient have uncontrolled Hypertension?: Yes Is patient checking Blood Pressure at home?: Yes Patient's Recent BP reading at home: 135/70 right arm on 01/22/2024. Hx of existing port managed by nephrologist to the left arm from previoyus dialysis Cardiovascular Management Strategies: Coping strategies, Routine screening, Medication therapy  Respiratory Respiratory Symptoms Reported: No symptoms reported Respiratory Management Strategies: Routine screening Respiratory Self-Management Outcome: 4 (good)  Endocrine Endocrine Symptoms Reported: No symptoms reported    Gastrointestinal Gastrointestinal Symptoms Reported: No symptoms reported Gastrointestinal Management Strategies: Medication therapy    Genitourinary Genitourinary Symptoms Reported: No symptoms reported Genitourinary Management Strategies: Coping strategies Genitourinary Self-Management Outcome: 4 (good)  Integumentary Additional Integumentary Details: [facility] completed therapy for upper body  on yesterday and the lower body therapy remains until pt obtains his prothesis. RN continue  to services patient via [facility] for the next few weeks. Skin Management Strategies: Coping strategies, Routine screening, Activity Skin Self-Management Outcome: 4 (good) Skin Comment: Rigt stump continue to heal with  no complications of delays with an existing dressing changes with HHRN.  Musculoskeletal Musculoskelatal Symptoms Reviewed: No symptoms reported Additional Musculoskeletal Details: Currently non-ambulatory due to BKA pending prothesis to right leg. Pt reports he has his left leg prothesis. Musculoskeletal Management Strategies: Routine screening, Medication therapy      Psychosocial Psychosocial Symptoms Reported: No symptoms reported Behavioral Management Strategies: Coping strategies, Support system Behavioral Health Self-Management Outcome: 4 (good)        There were no vitals filed for this visit.  Medications Reviewed Today     Reviewed by Alvia Olam BIRCH, RN (Registered Nurse) on 01/23/24 at 1040  Med List Status: <None>   Medication Order Taking? Sig Documenting Provider Last Dose Status Informant  allopurinol  (ZYLOPRIM ) 100 MG tablet [id number] Yes Take 100 mg by mouth daily. [provider]  Active Self  amLODipine  (NORVASC ) 10 MG tablet [id number] Yes Take 10 mg by mouth daily.  [provider]  Active Self  aspirin  81 MG chewable tablet [id number] Yes Chew 81 mg by mouth daily. [provider]  Active Self  azithromycin  (ZITHROMAX  Z-PAK) 250 MG tablet [id number]  2 pills by mouth day one, then one pill a day by mouth until complete  Patient not taking: Reported on 12/27/2023   Emil Share, DO  Active   calcitRIOL  (ROCALTROL ) 0.5 MCG capsule [id number] Yes Take 0.5 mcg by mouth every evening.  [provider]  Active Self  dextromethorphan -guaiFENesin  (MUCINEX  DM) 30-600 MG 12hr tablet [id number] Yes Take 1 tablet by mouth 2 (two) times daily  as needed for  cough. Cheryle Page, MD  Active   HYDROcodone -acetaminophen  (NORCO/VICODIN) 5-325 MG tablet [id number]  Take 1-2 tablets by mouth every 6 (six) hours as needed for moderate pain.  Patient not taking: Reported on 01/23/2024   Zackowski, Scott, MD  Active Self  K Phos  Mono-Sod Phos Leona & Mono (PHOSPHA 250 NEUTRAL) 155-852-130 MG TABS [id number] Yes Take 1 tablet by mouth daily.  [provider]  Active Self  magnesium  oxide (MAG-OX) 400 MG tablet [id number] Yes Take 400 mg by mouth 2 (two) times daily.  [provider]  Active Self  mycophenolate  (MYFORTIC ) 180 MG EC tablet [id number] Yes Take 360 mg by mouth 2 (two) times daily. [provider]  Active Self  ondansetron  (ZOFRAN ) 4 MG tablet [id number] Yes Take 1 tablet (4 mg total) by mouth every 6 (six) hours as needed for nausea. Cheryle Page, MD  Active   predniSONE  (DELTASONE ) 20 MG tablet [id number] Yes 2 tabs po daily x 4 days Floyd, Dan, DO  Active   ranitidine (ZANTAC) 75 MG tablet [id number] Yes Take 75 mg by mouth daily. [provider]  Active Self  simvastatin  (ZOCOR ) 20 MG tablet [id number] Yes Take 20 mg by mouth every evening. [provider]  Active Self  sodium bicarbonate  650 MG tablet [id number] Yes Take 650 mg by mouth 3 (three) times daily. [provider]  Active Self  sulfamethoxazole -trimethoprim  (BACTRIM ,SEPTRA ) 400-80 MG per tablet [id number]  Take 1 tablet by mouth 3 (three) times a week. Monday, Wednesday and Friday  Patient not taking: Reported on 01/23/2024   [provider]  Active Self           Med Note ALLEGRA, [facility] I   Fri May 23, 2018  8:44 PM) Ongoing ABT.  tacrolimus  (PROGRAF ) 1 MG capsule [id number] Yes Take 4 mg by mouth 2 (two) times daily.  [provider]  Active Self            Recommendation:   PCP Follow-up Continue Current Plan of Care  Follow Up Plan:   Telephone follow up appointment date/time:  02/20/2024  @ 10:00 AM   Olam Ku, RN, BSN [place]  [facility], [facility] Health RN Care Manager Direct Dial: [phone]  Fax: [phone]

## 2024-01-23 NOTE — Patient Instructions (Signed)
 Visit Information  Thank you for taking time to visit with me today. Please don't hesitate to contact me if I can be of assistance to you before our next scheduled appointment.  Your next care management appointment is by telephone on 02/20/2024 at 10:00 AM  Please call the care guide team at [phone] if you need to cancel, schedule, or reschedule an appointment.   Please call the Suicide and Crisis Lifeline: 988 call the [place]  National Suicide Prevention Lifeline: 540 113 9549 or TTY: (210)655-3880 TTY 458 328 7787) to talk to a trained counselor call 1-800-273-TALK (toll free, 24 hour hotline) if you are experiencing a Mental Health or Behavioral Health Crisis or need someone to talk to.  Olam Ku, RN, BSN [place]  [facility], [facility] RN Care Manager Direct Dial: [phone]  Fax: [phone]

## 2024-01-28 DIAGNOSIS — Z993 Dependence on wheelchair: Secondary | ICD-10-CM | POA: Diagnosis not present

## 2024-01-28 DIAGNOSIS — L89320 Pressure ulcer of left buttock, unstageable: Secondary | ICD-10-CM | POA: Diagnosis not present

## 2024-01-28 DIAGNOSIS — L98492 Non-pressure chronic ulcer of skin of other sites with fat layer exposed: Secondary | ICD-10-CM | POA: Diagnosis not present

## 2024-01-28 DIAGNOSIS — Z89511 Acquired absence of right leg below knee: Secondary | ICD-10-CM | POA: Diagnosis not present

## 2024-01-28 DIAGNOSIS — R634 Abnormal weight loss: Secondary | ICD-10-CM | POA: Diagnosis not present

## 2024-02-06 DIAGNOSIS — L98492 Non-pressure chronic ulcer of skin of other sites with fat layer exposed: Secondary | ICD-10-CM | POA: Diagnosis not present

## 2024-02-06 DIAGNOSIS — R239 Unspecified skin changes: Secondary | ICD-10-CM | POA: Diagnosis not present

## 2024-02-06 DIAGNOSIS — L89320 Pressure ulcer of left buttock, unstageable: Secondary | ICD-10-CM | POA: Diagnosis not present

## 2024-02-06 DIAGNOSIS — I872 Venous insufficiency (chronic) (peripheral): Secondary | ICD-10-CM | POA: Diagnosis not present

## 2024-02-11 DIAGNOSIS — I872 Venous insufficiency (chronic) (peripheral): Secondary | ICD-10-CM | POA: Diagnosis not present

## 2024-02-20 ENCOUNTER — Telehealth: Payer: PRIVATE HEALTH INSURANCE | Admitting: *Deleted

## 2024-02-20 ENCOUNTER — Telehealth: Payer: Self-pay | Admitting: *Deleted

## 2024-02-20 ENCOUNTER — Encounter: Payer: Self-pay | Admitting: *Deleted

## 2024-02-20 DIAGNOSIS — L98492 Non-pressure chronic ulcer of skin of other sites with fat layer exposed: Secondary | ICD-10-CM | POA: Diagnosis not present

## 2024-02-20 DIAGNOSIS — R239 Unspecified skin changes: Secondary | ICD-10-CM | POA: Diagnosis not present

## 2024-02-20 DIAGNOSIS — L89320 Pressure ulcer of left buttock, unstageable: Secondary | ICD-10-CM | POA: Diagnosis not present

## 2024-02-20 NOTE — Patient Instructions (Signed)
 Alm Leyden - I am sorry I was unable to reach you today for our scheduled appointment. I work with Catalina Bare, MD and am calling to support your healthcare needs. Please contact me at [phone] at your earliest convenience. I look forward to speaking with you soon.   Thank you,   Olam Ku, RN, BSN [place]  [facility], [facility] Health RN Care Manager Direct Dial: [phone]  Fax: [phone]

## 2024-02-25 ENCOUNTER — Telehealth: Payer: Self-pay | Admitting: *Deleted

## 2024-02-25 ENCOUNTER — Encounter: Payer: Self-pay | Admitting: *Deleted

## 2024-02-25 NOTE — Patient Instructions (Signed)
 Alm Leyden - I am sorry I was unable to reach you today for our scheduled appointment. I work with Catalina Bare, MD and am calling to support your healthcare needs. Please contact me at [phone] at your earliest convenience. I look forward to speaking with you soon.   Thank you,   Olam Ku, RN, BSN [place]  [facility], [facility] Health RN Care Manager Direct Dial: [phone]  Fax: [phone]

## 2024-03-02 ENCOUNTER — Encounter: Payer: Self-pay | Admitting: *Deleted

## 2024-03-02 ENCOUNTER — Telehealth: Payer: Self-pay | Admitting: *Deleted

## 2024-03-02 NOTE — Patient Outreach (Signed)
 Complex Care Management   Visit Note  03/02/2024  Name:  Perri Lamagna MRN: [id number] DOB: 12/08/1940  Follow Up Plan:   We have been unable to make contact with the patient x 3 attempts. Closing from Complex Care Management   Olam Ku, RN, BSN [place]  [facility], [facility] Health RN Care Manager Direct Dial: [phone]  Fax: [phone]

## 2024-03-02 NOTE — Patient Instructions (Signed)
 Alm Leyden - I have attempted to call you three times but have been unsuccessful in reaching you. I work with Catalina Bare, MD and am calling to support your healthcare needs. If I can be of assistance to you, please contact me at [phone].     Thank you,   Olam Ku, RN, BSN [place]  [facility], [facility] Health RN Care Manager Direct Dial: [phone]  Fax: [phone]

## 2024-03-03 DIAGNOSIS — L98492 Non-pressure chronic ulcer of skin of other sites with fat layer exposed: Secondary | ICD-10-CM | POA: Diagnosis not present

## 2024-03-03 DIAGNOSIS — L89320 Pressure ulcer of left buttock, unstageable: Secondary | ICD-10-CM | POA: Diagnosis not present
